# Patient Record
Sex: Male | Born: 1940 | Race: White | Hispanic: No | Marital: Married | Smoking: Former smoker
Health system: Southern US, Community
[De-identification: ages and names within clinical notes are randomized; demographics above are authoritative.]

## PROBLEM LIST (undated history)

## (undated) DIAGNOSIS — K648 Other hemorrhoids: Secondary | ICD-10-CM

## (undated) DIAGNOSIS — N486 Induration penis plastica: Secondary | ICD-10-CM

## (undated) DIAGNOSIS — K5790 Diverticulosis of intestine, part unspecified, without perforation or abscess without bleeding: Secondary | ICD-10-CM

## (undated) DIAGNOSIS — C61 Malignant neoplasm of prostate: Secondary | ICD-10-CM

## (undated) DIAGNOSIS — N401 Enlarged prostate with lower urinary tract symptoms: Secondary | ICD-10-CM

## (undated) DIAGNOSIS — G4730 Sleep apnea, unspecified: Secondary | ICD-10-CM

## (undated) DIAGNOSIS — K219 Gastro-esophageal reflux disease without esophagitis: Secondary | ICD-10-CM

## (undated) DIAGNOSIS — N138 Other obstructive and reflux uropathy: Secondary | ICD-10-CM

## (undated) DIAGNOSIS — L719 Rosacea, unspecified: Secondary | ICD-10-CM

## (undated) DIAGNOSIS — I499 Cardiac arrhythmia, unspecified: Secondary | ICD-10-CM

## (undated) DIAGNOSIS — K2270 Barrett's esophagus without dysplasia: Secondary | ICD-10-CM

## (undated) DIAGNOSIS — G4733 Obstructive sleep apnea (adult) (pediatric): Secondary | ICD-10-CM

## (undated) HISTORY — DX: Other hemorrhoids: K64.8

## (undated) HISTORY — DX: Other obstructive and reflux uropathy: N13.8

## (undated) HISTORY — DX: Benign prostatic hyperplasia with lower urinary tract symptoms: N40.1

## (undated) HISTORY — PX: EYE SURGERY: SHX253

## (undated) HISTORY — DX: Sleep apnea, unspecified: G47.30

## (undated) HISTORY — DX: Barrett's esophagus without dysplasia: K22.70

## (undated) HISTORY — DX: Cardiac arrhythmia, unspecified: I49.9

## (undated) HISTORY — PX: VASECTOMY: SHX75

## (undated) HISTORY — DX: Obstructive sleep apnea (adult) (pediatric): G47.33

## (undated) HISTORY — DX: Induration penis plastica: N48.6

## (undated) HISTORY — DX: Rosacea, unspecified: L71.9

## (undated) HISTORY — PX: HERNIA REPAIR: SHX51

## (undated) HISTORY — PX: TONSILLECTOMY AND ADENOIDECTOMY: SUR1326

## (undated) HISTORY — DX: Diverticulosis of intestine, part unspecified, without perforation or abscess without bleeding: K57.90

## (undated) HISTORY — PX: TUBAL LIGATION: SHX77

---
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
## 2010-02-11 DIAGNOSIS — H9190 Unspecified hearing loss, unspecified ear: Secondary | ICD-10-CM | POA: Insufficient documentation

## 2011-02-19 DIAGNOSIS — K648 Other hemorrhoids: Secondary | ICD-10-CM | POA: Insufficient documentation

## 2013-03-17 HISTORY — PX: CATARACT EXTRACTION, BILATERAL: SHX1313

## 2014-05-03 LAB — HM COLONOSCOPY

## 2016-11-11 ENCOUNTER — Ambulatory Visit (INDEPENDENT_AMBULATORY_CARE_PROVIDER_SITE_OTHER): Payer: Medicare Other | Admitting: Internal Medicine

## 2016-11-11 ENCOUNTER — Encounter: Payer: Self-pay | Admitting: Internal Medicine

## 2016-11-11 VITALS — BP 118/76 | HR 67 | Temp 97.7°F | Ht 69.25 in | Wt 198.0 lb

## 2016-11-11 DIAGNOSIS — G4733 Obstructive sleep apnea (adult) (pediatric): Secondary | ICD-10-CM

## 2016-11-11 DIAGNOSIS — N401 Enlarged prostate with lower urinary tract symptoms: Secondary | ICD-10-CM | POA: Insufficient documentation

## 2016-11-11 DIAGNOSIS — Z7189 Other specified counseling: Secondary | ICD-10-CM | POA: Insufficient documentation

## 2016-11-11 DIAGNOSIS — Z23 Encounter for immunization: Secondary | ICD-10-CM

## 2016-11-11 DIAGNOSIS — L719 Rosacea, unspecified: Secondary | ICD-10-CM | POA: Diagnosis not present

## 2016-11-11 DIAGNOSIS — K2270 Barrett's esophagus without dysplasia: Secondary | ICD-10-CM

## 2016-11-11 DIAGNOSIS — N138 Other obstructive and reflux uropathy: Secondary | ICD-10-CM | POA: Diagnosis not present

## 2016-11-11 NOTE — Assessment & Plan Note (Signed)
Quiet with the PPI Will continue  EGD last 2016

## 2016-11-11 NOTE — Addendum Note (Signed)
Addended by: Pilar Grammes on: 11/11/2016 01:35 PM   Modules accepted: Orders

## 2016-11-11 NOTE — Assessment & Plan Note (Signed)
Plans to restart the CPAP Does feel better when he uses it

## 2016-11-11 NOTE — Assessment & Plan Note (Signed)
See social history 

## 2016-11-11 NOTE — Assessment & Plan Note (Signed)
Satisfied with the saw palmetto No changes for now

## 2016-11-11 NOTE — Progress Notes (Signed)
Subjective:    Patient ID: Christian Carlson, male    DOB: [birth year], 76 y.o.   MRN: [id number]  HPI Here to establish care Recently moved to [facility] with his wife--- about 6 weeks ago  Reviewed records from [facility]  History of GERD/Barrett's esophagus Take omeprazole daily EGD done 04/23/14 along with colonoscopy Internal hemorrhoids also---occasional bleeding if he has to strain  Has BPH Feels the saw palmetto with some improvement Nocturia x 1 usually Some daytime urgency  rosacea on nose mostly Uses OTC oil for this No Rx needed for this  Has OSA Sleeps with a CPAP--though hasn't used it since moving here Not tired during the day but does feel better after using Will restart it  Right 3rd finger triggering at times Not persistent  No current outpatient prescriptions on file prior to visit.   No current facility-administered medications on file prior to visit.     No Known Allergies  Past Medical History:  Diagnosis Date  . Barrett's esophagus   . BPH with obstruction/lower urinary tract symptoms   . Diverticulosis   . Internal hemorrhoids   . Obstructive sleep apnea   . 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 Rosacea     Past Surgical History:  Procedure Laterality Date  . CATARACT EXTRACTION, BILATERAL  2015  . TONSILLECTOMY AND ADENOIDECTOMY      Family History  Problem Relation Age of Onset  . Stroke Father   . Diabetes Father   . Hypertension Sister   . Stroke Brother   . Esophageal cancer Brother   . Heart disease Neg Hx     Social History   Social History  . Marital status: Married    Spouse name: N/A  . Number of children: 2  . Years of education: N/A   Occupational History  . Primary school teacher     Retired   Social History Main Topics  . Smoking status: Former Research scientist (life sciences)  . Smokeless tobacco: Never Used  . Alcohol use No     Comment: quit 2 years ago with wife's breast cancer  . Drug use: Unknown  . Sexual  activity: Not on file   Other Topics Concern  . Not on file   Social History Narrative   Has living will   Wife, then daughter, should make health care decisions   Would accept resuscitation but no prolonged ventilation   No tube feeds if cognitively unaware   Review of Systems  Constitutional: Negative for fatigue.       Did lose 20# with dietary changes recently  HENT: Negative for dental problem, hearing loss and trouble swallowing.   Eyes: Negative for visual disturbance.  Respiratory: Negative for shortness of breath.        Slight tickle cough at times--- irritation from pollen recently?  Cardiovascular: Positive for palpitations. Negative for chest pain and leg swelling.       No tachycardia  Gastrointestinal: Negative for constipation and nausea.       Rare blood from hemorrhoid (paper)  Endocrine: Negative for polydipsia and polyuria.  Genitourinary: Positive for difficulty urinating and urgency.  Musculoskeletal: Positive for arthralgias. Negative for back pain and joint swelling.       Mild pain in hands  Skin:       Wart on chest --wants checked rosacea  Allergic/Immunologic: Negative for environmental allergies and immunocompromised state.  Neurological: Negative for dizziness, syncope and light-headedness.  Psychiatric/Behavioral: Positive for sleep  disturbance. Negative for dysphoric mood. The patient is not nervous/anxious.        Objective:   Physical Exam  Constitutional: He appears well-nourished. No distress.  Neck: No thyromegaly present.  Cardiovascular: Normal rate, regular rhythm, normal heart sounds and intact distal pulses.  Exam reveals no gallop.   No murmur heard. Pulmonary/Chest: Effort normal and breath sounds normal. No respiratory distress. He has no wheezes. He has no rales.  Abdominal: Soft. There is no tenderness.  Musculoskeletal: He exhibits no edema or tenderness.  Lymphadenopathy:    He has no cervical adenopathy.  Skin:  Moderate  bulbous nose of rosacea--not really inflamed Benign 43mm lesion on upper chest Multiple cherry angiomas and benign nevi Mycotic toenails  Psychiatric: He has a normal mood and affect. His behavior is normal.          Assessment & Plan:

## 2016-11-11 NOTE — Assessment & Plan Note (Signed)
Uses OTC product for this

## 2017-05-28 DIAGNOSIS — C4492 Squamous cell carcinoma of skin, unspecified: Secondary | ICD-10-CM

## 2017-05-28 HISTORY — DX: Squamous cell carcinoma of skin, unspecified: C44.92

## 2017-08-04 ENCOUNTER — Encounter: Payer: Self-pay | Admitting: Internal Medicine

## 2017-08-04 ENCOUNTER — Ambulatory Visit (INDEPENDENT_AMBULATORY_CARE_PROVIDER_SITE_OTHER): Payer: Medicare Other | Admitting: Internal Medicine

## 2017-08-04 VITALS — BP 116/78 | HR 69 | Temp 97.5°F | Ht 69.0 in | Wt 206.0 lb

## 2017-08-04 DIAGNOSIS — K2270 Barrett's esophagus without dysplasia: Secondary | ICD-10-CM

## 2017-08-04 DIAGNOSIS — G4733 Obstructive sleep apnea (adult) (pediatric): Secondary | ICD-10-CM

## 2017-08-04 DIAGNOSIS — Z7189 Other specified counseling: Secondary | ICD-10-CM

## 2017-08-04 DIAGNOSIS — N401 Enlarged prostate with lower urinary tract symptoms: Secondary | ICD-10-CM

## 2017-08-04 DIAGNOSIS — N138 Other obstructive and reflux uropathy: Secondary | ICD-10-CM | POA: Diagnosis not present

## 2017-08-04 DIAGNOSIS — R972 Elevated prostate specific antigen [PSA]: Secondary | ICD-10-CM | POA: Diagnosis not present

## 2017-08-04 DIAGNOSIS — Z0000 Encounter for general adult medical examination without abnormal findings: Secondary | ICD-10-CM | POA: Diagnosis not present

## 2017-08-04 LAB — COMPREHENSIVE METABOLIC PANEL
ALBUMIN: 4.1 g/dL (ref 3.5–5.2)
ALK PHOS: 44 U/L (ref 39–117)
ALT: 17 U/L (ref 0–53)
AST: 17 U/L (ref 0–37)
BUN: 15 mg/dL (ref 6–23)
CALCIUM: 9.1 mg/dL (ref 8.4–10.5)
CO2: 32 mEq/L (ref 19–32)
Chloride: 103 mEq/L (ref 96–112)
Creatinine, Ser: 1.21 mg/dL (ref 0.40–1.50)
GFR: 61.71 mL/min (ref >60.00)
Glucose, Bld: 88 mg/dL (ref 70–99)
POTASSIUM: 4.8 meq/L (ref 3.5–5.1)
Sodium: 139 mEq/L (ref 135–145)
TOTAL PROTEIN: 7.1 g/dL (ref 6.0–8.3)
Total Bilirubin: 0.5 mg/dL (ref 0.2–1.2)

## 2017-08-04 LAB — CBC
HCT: 42.3 % (ref 39.0–52.0)
HEMOGLOBIN: 14.3 g/dL (ref 13.0–17.0)
MCHC: 33.7 g/dL (ref 30.0–36.0)
MCV: 92.2 fl (ref 78.0–100.0)
PLATELETS: 220 10*3/uL (ref 150.0–400.0)
RBC: 4.59 Mil/uL (ref 4.22–5.81)
RDW: 13.7 % (ref 11.5–15.5)
WBC: 5.3 10*3/uL (ref 4.0–10.5)

## 2017-08-04 NOTE — Assessment & Plan Note (Signed)
Last 5.2 three years ago I am not excited about repeating but he is worried Will check free PSA and decide on action

## 2017-08-04 NOTE — Assessment & Plan Note (Signed)
Does well with the CPAP 

## 2017-08-04 NOTE — Progress Notes (Addendum)
Subjective:    Patient ID: Christian Carlson, male    DOB: 03-16-1941, 77 y.o.   MRN: [id number]  HPI Here for Medicare wellness visit and follow up of chronic health conditions Reviewed form and advanced directives Reviewed other doctors No alcohol or tobacco Stays active Vision is okay--reading glasses Hearing aides help No falls No depression or anhedonia Independent with instrumental ADLs Mild memory issues--just recall. Nothing worrisome  Doing well at [facility] fairly comfortable there  Continues on the PPI Occasional heartburn --but not bad No dysphagia Due for repeat EGD  Ongoing slow urinary stream Some dribbling Nocturia x 2 generally Still on saw palmetto Last PSA -5.2 (2/16)  Went back to using the CPAP Pretty consistent with it---almost every night Wife notes a difference with it  Current Outpatient Medications on File Prior to Visit  Medication Sig Dispense Refill  . aspirin 81 MG chewable tablet Chew 81 mg by mouth daily.    . magnesium oxide (MAG-OX) 400 MG tablet Take 400 mg by mouth daily.    . Multiple Vitamin (MULTI-VITAMINS) TABS Take 1 tablet by mouth daily.    . Omega-3 Fatty Acids (FISH OIL PO) Take 1,200 mg by mouth daily.    Marland Kitchen omeprazole (PRILOSEC) 20 MG capsule Take 20 mg by mouth daily.    . Saw Palmetto, Serenoa repens, (SAW PALMETTO PO) Take 1 tablet by mouth daily.     No current facility-administered medications on file prior to visit.     No Known Allergies  Past Medical History:  Diagnosis Date  . Barrett's esophagus   . BPH with obstruction/lower urinary tract symptoms   . Diverticulosis   . Internal hemorrhoids   . Obstructive sleep apnea   . 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 Rosacea     Past Surgical History:  Procedure Laterality Date  . CATARACT EXTRACTION, BILATERAL  2015  . TONSILLECTOMY AND ADENOIDECTOMY      Family History  Problem Relation Age of Onset  . Stroke Father   . Diabetes  Father   . Hypertension Sister   . Stroke Brother   . Esophageal cancer Brother   . Heart disease Neg Hx     Social History   Socioeconomic History  . Marital status: Married    Spouse name: Not on file  . Number of children: 2  . Years of education: Not on file  . Highest education level: Not on file  Occupational History  . 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 Financial resource strain: Not on file  . Food insecurity:    Worry: Not on file    Inability: Not on file  . Transportation needs:    Medical: Not on file    Non-medical: Not on file  Tobacco Use  . Smoking status: Former Research scientist (life sciences)  . Smokeless tobacco: Never Used  Substance and Sexual Activity  . Alcohol use: No    Comment: quit 2 years ago with wife's breast cancer  . Drug use: Not on file  . Sexual activity: Not on file  Lifestyle  . Physical activity:    Days per week: Not on file    Minutes per session: Not on file  . Stress: Not on file  Relationships  . Social connections:    Talks on phone: Not on file    Gets together: Not on file    Attends religious service: Not on file  Active member of club or organization: Not on file    Attends meetings of clubs or organizations: Not on file    Relationship status: Not on file  . Intimate partner violence:    Fear of current or ex partner: Not on file    Emotionally abused: Not on file    Physically abused: Not on file    Forced sexual activity: Not on file  Other Topics Concern  . Not on file  Social History Narrative   Has living will   Wife, then daughter, should make health care decisions   Would accept resuscitation but no prolonged ventilation   No tube feeds if cognitively unaware   Review of Systems Has gained 8# since last visit Appetite is fine Teeth okay---keeps up with dentist (in [place]) Wears seat belt Bowels are okay--some constipation. No blood Does have internal hemorrhoid which will bleed  at times though No sig back pain. Some joint aches--not much. No meds Sun damaged lesions --seeing dermatologist Had some chest pain yesterday---after working on the garden (soreness) No SOB No dizziness or syncope No edema No palpitations    Objective:   Physical Exam  Constitutional: He is oriented to person, place, and time. He appears well-developed. No distress.  HENT:  Mouth/Throat: Oropharynx is clear and moist. No oropharyngeal exudate.  Neck: No thyromegaly present.  Cardiovascular: Normal rate, regular rhythm, normal heart sounds and intact distal pulses. Exam reveals no gallop.  No murmur heard. Pulmonary/Chest: Effort normal and breath sounds normal. No respiratory distress. He has no wheezes. He has no rales.  Abdominal: Soft. There is no tenderness.  Musculoskeletal: He exhibits no edema or tenderness.  Lymphadenopathy:    He has no cervical adenopathy.  Neurological: He is alert and oriented to person, place, and time.  President--- "Dwaine Deter, Bush" [phone] [id number] Recall 2/3  Skin: Skin is warm. No rash noted.  Psychiatric: He has a normal mood and affect. His behavior is normal.          Assessment & Plan:

## 2017-08-04 NOTE — Progress Notes (Signed)
Visual Acuity Screening   Right eye Left eye Both eyes  Without correction: 20/25 20/30 20/25   With correction:     Hearing Screening Comments: Wears hearing aids

## 2017-08-04 NOTE — Assessment & Plan Note (Signed)
See social history 

## 2017-08-04 NOTE — Assessment & Plan Note (Signed)
I have personally reviewed the Medicare Annual Wellness questionnaire and have noted 1. The patient's medical and social history 2. Their use of alcohol, tobacco or illicit drugs 3. Their current medications and supplements 4. The patient's functional ability including ADL's, fall risks, home safety risks and hearing or visual             impairment. 5. Diet and physical activities 6. Evidence for depression or mood disorders  The patients weight, height, BMI and visual acuity have been recorded in the chart I have made referrals, counseling and provided education to the patient based review of the above and I have provided the pt with a written personalized care plan for preventive services.  I have provided you with a copy of your personalized plan for preventive services. Please take the time to review along with your updated medication list.  Yearly flu vaccine Colon okay 2/16 Discussed healthy eating, etc UTD on other vaccines

## 2017-08-04 NOTE — Assessment & Plan Note (Signed)
Voids okay on the saw palmetto

## 2017-08-04 NOTE — Assessment & Plan Note (Signed)
Mild reflux symptoms Due for repeat EGD--- will refer to GI

## 2017-08-05 ENCOUNTER — Encounter: Payer: Self-pay | Admitting: Internal Medicine

## 2017-08-05 LAB — PSA, TOTAL AND FREE
PSA, % Free: 8 % (calc) — ABNORMAL LOW (ref >25)
PSA, Free: 0.6 ng/mL
PSA, Total: 7.5 ng/mL — ABNORMAL HIGH (ref <=4.0)

## 2017-08-06 ENCOUNTER — Encounter: Payer: Self-pay | Admitting: Internal Medicine

## 2017-09-14 ENCOUNTER — Other Ambulatory Visit: Payer: Self-pay

## 2017-09-15 ENCOUNTER — Other Ambulatory Visit: Payer: Self-pay

## 2017-09-15 ENCOUNTER — Encounter: Payer: Self-pay | Admitting: Gastroenterology

## 2017-09-15 ENCOUNTER — Ambulatory Visit: Payer: Medicare Other | Admitting: Gastroenterology

## 2017-09-15 VITALS — BP 142/93 | HR 67 | Ht 69.0 in | Wt 207.2 lb

## 2017-09-15 DIAGNOSIS — K2270 Barrett's esophagus without dysplasia: Secondary | ICD-10-CM | POA: Diagnosis not present

## 2017-09-15 NOTE — Progress Notes (Signed)
Christian Bellows MD, MRCP(U.K) [address]  [place]  [place], [place] [address]  Main: [phone]  Fax: [phone]   Gastroenterology Consultation  Referring Provider:     Venia Carbon, MD Primary Care Physician:  Venia Carbon, MD Primary Gastroenterologist:  Dr. Jonathon Carlson  Reason for Consultation:     Barrettes esophagus.         HPI:   Debbie Bellucci is a 77 y.o. y/o male referred for consultation & management  by Dr. Silvio Pate, Theophilus Kinds, MD.    She has been referred for Barrettes esophagus without dysplasia.  Reviewed old EGD records. 04/2014 showed features of short segment Barretts esophagus 2 cm in length from 39 cm to 41 cm .Medium sized hiatal hernia. Biopsy showed sparse intestinal metaplasia and no dysplasia.   Doing well on Prilosec , no symptoms. Does not smoke. Family history of esophageal cancer.   Past Medical History:  Diagnosis Date  . Barrett's esophagus   . BPH with obstruction/lower urinary tract symptoms   . Diverticulosis   . Internal hemorrhoids   . Obstructive sleep apnea   . 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 Rosacea     Past Surgical History:  Procedure Laterality Date  . CATARACT EXTRACTION, BILATERAL  2015  . TONSILLECTOMY AND ADENOIDECTOMY      Prior to Admission medications   Medication Sig Start Date End Date Taking? Authorizing Provider  aspirin 81 MG chewable tablet Chew 81 mg by mouth daily. 09/09/11  Yes [provider]  hydrocortisone 2.5 % cream  07/01/17  Yes [provider]  magnesium oxide (MAG-OX) 400 MG tablet Take 400 mg by mouth daily.   Yes [provider]  Multiple Vitamin (MULTI-VITAMINS) TABS Take 1 tablet by mouth daily. 02/19/11  Yes [provider]  omeprazole (PRILOSEC) 20 MG capsule Take 20 mg by mouth daily.   Yes [provider]  Saw Palmetto, Serenoa repens, (SAW PALMETTO PO) Take 1 tablet by mouth daily.   Yes [provider]    Omega-3 Fatty Acids (FISH OIL PO) Take 1,200 mg by mouth daily. 02/19/11   [provider]    Family History  Problem Relation Age of Onset  . Stroke Father   . Diabetes Father   . Hypertension Sister   . Stroke Brother   . Esophageal cancer Brother   . Heart disease Neg Hx      Social History   Tobacco Use  . Smoking status: Former Research scientist (life sciences)  . Smokeless tobacco: Never Used  Substance Use Topics  . Alcohol use: No    Comment: quit 2 years ago with wife's breast cancer  . Drug use: Not on file    Allergies as of 09/15/2017  . (No Known Allergies)    Review of Systems:    All systems reviewed and negative except where noted in HPI.   Physical Exam:  BP (!) 142/93   Pulse 67   Ht 5\' 9"  (1.753 m)   Wt 207 lb 3.2 oz (94 kg)   BMI 30.60 kg/m  No LMP for male patient. Psych:  Alert and cooperative. Normal mood and affect. General:   Alert,  Well-developed, well-nourished, pleasant and cooperative in NAD Head:  Normocephalic and atraumatic. Eyes:  Sclera clear, no icterus.   Conjunctiva pink. Ears:  Normal auditory acuity. Nose:  No deformity, discharge, or lesions. Mouth:  No deformity or lesions,oropharynx pink & moist. Neck:  Supple; no masses or thyromegaly. Lungs:  Respirations even and unlabored.  Clear throughout to auscultation.   No wheezes, crackles, or rhonchi. No acute distress. Heart:  Regular rate and rhythm; no murmurs, clicks, rubs, or gallops. Abdomen:  Normal bowel sounds.  No bruits.  Soft, non-tender and non-distended without masses, hepatosplenomegaly or hernias noted.  No guarding or rebound tenderness.    Neurologic:  Alert and oriented x3;  grossly normal neurologically. Skin:  Intact without significant lesions or rashes. No jaundice. Lymph Nodes:  No significant cervical adenopathy. Psych:  Alert and cooperative. Normal mood and affect.  Imaging Studies: No results found.  Assessment and Plan:   Slayton Lubitz is a 77 y.o. y/o male has  been referred for barrettes esophagus surveillance. Counseled on life style changes for GERD.   Plan  1. EGD   I have discussed alternative options, risks & benefits,  which include, but are not limited to, bleeding, infection, perforation,respiratory complication & drug reaction.  The patient agrees with this plan & written consent will be obtained.     Follow up PRN  Dr Christian Bellows MD,MRCP(U.K)

## 2017-09-15 NOTE — Patient Instructions (Signed)
Gastroesophageal Reflux Disease, Adult Normally, food travels down the esophagus and stays in the stomach to be digested. However, when a person has gastroesophageal reflux disease (GERD), food and stomach acid move back up into the esophagus. When this happens, the esophagus becomes sore and inflamed. Over time, GERD can create small holes (ulcers) in the lining of the esophagus. What are the causes? This condition is caused by a problem with the muscle between the esophagus and the stomach (lower esophageal sphincter, or LES). Normally, the LES muscle closes after food passes through the esophagus to the stomach. When the LES is weakened or abnormal, it does not close properly, and that allows food and stomach acid to go back up into the esophagus. The LES can be weakened by certain dietary substances, medicines, and medical conditions, including:  Tobacco use.  Pregnancy.  Having a hiatal hernia.  Heavy alcohol use.  Certain foods and beverages, such as coffee, chocolate, onions, and peppermint.  What increases the risk? This condition is more likely to develop in:  People who have an increased body weight.  People who have connective tissue disorders.  People who use NSAID medicines.  What are the signs or symptoms? Symptoms of this condition include:  Heartburn.  Difficult or painful swallowing.  The feeling of having a lump in the throat.  Abitter taste in the mouth.  Bad breath.  Having a large amount of saliva.  Having an upset or bloated stomach.  Belching.  Chest pain.  Shortness of breath or wheezing.  Ongoing (chronic) cough or a night-time cough.  Wearing away of tooth enamel.  Weight loss.  Different conditions can cause chest pain. Make sure to see your health care provider if you experience chest pain. How is this diagnosed? Your health care provider will take a medical history and perform a physical exam. To determine if you have mild or severe  GERD, your health care provider may also monitor how you respond to treatment. You may also have other tests, including:  An endoscopy toexamine your stomach and esophagus with a small camera.  A test thatmeasures the acidity level in your esophagus.  A test thatmeasures how much pressure is on your esophagus.  A barium swallow or modified barium swallow to show the shape, size, and functioning of your esophagus.  How is this treated? The goal of treatment is to help relieve your symptoms and to prevent complications. Treatment for this condition may vary depending on how severe your symptoms are. Your health care provider may recommend:  Changes to your diet.  Medicine.  Surgery.  Follow these instructions at home: Diet  Follow a diet as recommended by your health care provider. This may involve avoiding foods and drinks such as: ? Coffee and tea (with or without caffeine). ? Drinks that containalcohol. ? Energy drinks and sports drinks. ? Carbonated drinks or sodas. ? Chocolate and cocoa. ? Peppermint and mint flavorings. ? Garlic and onions. ? Horseradish. ? Spicy and acidic foods, including peppers, chili powder, curry powder, vinegar, hot sauces, and barbecue sauce. ? Citrus fruit juices and citrus fruits, such as oranges, lemons, and limes. ? Tomato-based foods, such as red sauce, chili, salsa, and pizza with red sauce. ? Fried and fatty foods, such as donuts, french fries, potato chips, and high-fat dressings. ? High-fat meats, such as hot dogs and fatty cuts of red and white meats, such as rib eye steak, sausage, ham, and bacon. ? High-fat dairy items, such as whole milk,   butter, and cream cheese.  Eat small, frequent meals instead of large meals.  Avoid drinking large amounts of liquid with your meals.  Avoid eating meals during the 2-3 hours before bedtime.  Avoid lying down right after you eat.  Do not exercise right after you eat. General  instructions  Pay attention to any changes in your symptoms.  Take over-the-counter and prescription medicines only as told by your health care provider. Do not take aspirin, ibuprofen, or other NSAIDs unless your health care provider told you to do so.  Do not use any tobacco products, including cigarettes, chewing tobacco, and e-cigarettes. If you need help quitting, ask your health care provider.  Wear loose-fitting clothing. Do not wear anything tight around your waist that causes pressure on your abdomen.  Raise (elevate) the head of your bed 6 inches (15cm).  Try to reduce your stress, such as with yoga or meditation. If you need help reducing stress, ask your health care provider.  If you are overweight, reduce your weight to an amount that is healthy for you. Ask your health care provider for guidance about a safe weight loss goal.  Keep all follow-up visits as told by your health care provider. This is important. Contact a health care provider if:  You have new symptoms.  You have unexplained weight loss.  You have difficulty swallowing, or it hurts to swallow.  You have wheezing or a persistent cough.  Your symptoms do not improve with treatment.  You have a hoarse voice. Get help right away if:  You have pain in your arms, neck, jaw, teeth, or back.  You feel sweaty, dizzy, or light-headed.  You have chest pain or shortness of breath.  You vomit and your vomit looks like blood or coffee grounds.  You faint.  Your stool is bloody or black.  You cannot swallow, drink, or eat. This information is not intended to replace advice given to you by your health care provider. Make sure you discuss any questions you have with your health care provider. Document Released: 12/11/2004 Document Revised: 08/01/2015 Document Reviewed: 06/28/2014 Elsevier Interactive Patient Education  2018 [employer]  

## 2017-10-09 ENCOUNTER — Encounter
Admission: RE | Disposition: A | Discharge status: Home / Self Care | Payer: Self-pay | Source: Ambulatory Visit | Attending: Gastroenterology

## 2017-10-09 ENCOUNTER — Encounter: Payer: Self-pay | Admitting: Anesthesiology

## 2017-10-09 ENCOUNTER — Ambulatory Visit: Payer: Medicare Other | Admitting: Anesthesiology

## 2017-10-09 ENCOUNTER — Ambulatory Visit
Admission: RE | Admit: 2017-10-09 | Discharge: 2017-10-09 | Disposition: A | Discharge status: Home / Self Care | Payer: Medicare Other | Source: Ambulatory Visit | Attending: Gastroenterology | Admitting: Gastroenterology

## 2017-10-09 DIAGNOSIS — K2270 Barrett's esophagus without dysplasia: Secondary | ICD-10-CM | POA: Diagnosis not present

## 2017-10-09 DIAGNOSIS — G4733 Obstructive sleep apnea (adult) (pediatric): Secondary | ICD-10-CM | POA: Diagnosis not present

## 2017-10-09 DIAGNOSIS — Z79899 Other long term (current) drug therapy: Secondary | ICD-10-CM | POA: Insufficient documentation

## 2017-10-09 DIAGNOSIS — Z8719 Personal history of other diseases of the digestive system: Secondary | ICD-10-CM | POA: Diagnosis not present

## 2017-10-09 DIAGNOSIS — Z87891 Personal history of nicotine dependence: Secondary | ICD-10-CM | POA: Diagnosis not present

## 2017-10-09 DIAGNOSIS — K210 Gastro-esophageal reflux disease with esophagitis: Secondary | ICD-10-CM | POA: Insufficient documentation

## 2017-10-09 DIAGNOSIS — Z9989 Dependence on other enabling machines and devices: Secondary | ICD-10-CM | POA: Diagnosis not present

## 2017-10-09 DIAGNOSIS — Z7982 Long term (current) use of aspirin: Secondary | ICD-10-CM | POA: Insufficient documentation

## 2017-10-09 HISTORY — DX: Gastro-esophageal reflux disease without esophagitis: K21.9

## 2017-10-09 HISTORY — PX: ESOPHAGOGASTRODUODENOSCOPY (EGD) WITH PROPOFOL: SHX5813

## 2017-10-09 SURGERY — ESOPHAGOGASTRODUODENOSCOPY (EGD) WITH PROPOFOL
Anesthesia: General

## 2017-10-09 MED ORDER — SODIUM CHLORIDE 0.9 % IV SOLN
INTRAVENOUS | Status: DC
  Administered 2017-10-09: 1000 mL via INTRAVENOUS

## 2017-10-09 MED ORDER — LIDOCAINE HCL (CARDIAC) PF 100 MG/5ML IV SOSY
PREFILLED_SYRINGE | INTRAVENOUS | Status: DC | PRN
  Administered 2017-10-09: 100 mg via INTRAVENOUS

## 2017-10-09 MED ORDER — PROPOFOL 500 MG/50ML IV EMUL
INTRAVENOUS | Status: DC | PRN
  Administered 2017-10-09: 160 ug/kg/min via INTRAVENOUS

## 2017-10-09 MED ORDER — PROPOFOL 10 MG/ML IV BOLUS
INTRAVENOUS | Status: DC | PRN
  Administered 2017-10-09: 70 mg via INTRAVENOUS

## 2017-10-09 NOTE — Op Note (Signed)
[facility] Patient Name: Christian Carlson Procedure Date: 10/09/2017 9:41 AM MRN: [id number] Account #: [id number] Date of Birth: [birth year] Admit Type: Outpatient Age: 77 Room: [facility] ENDO ROOM 4 Gender: Male Note Status: Finalized Procedure:            Upper GI endoscopy Indications:          Surveillance for malignancy due to personal history of                        Barrett's esophagus Providers:            Jonathon Bellows MD, MD Referring MD:         Venia Carbon (Referring MD) Medicines:            Monitored Anesthesia Care Complications:        No immediate complications. Procedure:            Pre-Anesthesia Assessment:                       - Prior to the procedure, a History and Physical was                        performed, and patient medications, allergies and                        sensitivities were reviewed. The patient's tolerance of                        previous anesthesia was reviewed.                       - The risks and benefits of the procedure and the                        sedation options and risks were discussed with the                        patient. All questions were answered and informed                        consent was obtained.                       - ASA Grade Assessment: II - A patient with mild                        systemic disease.                       After obtaining informed consent, the endoscope was                        passed under direct vision. Throughout the procedure,                        the patient's blood pressure, pulse, and oxygen                        saturations were monitored continuously. The Endoscope  was introduced through the mouth, and advanced to the                        third part of duodenum. The upper GI endoscopy was                        accomplished with ease. The patient tolerated the                        procedure well. Findings:      One  tongue of salmon-colored mucosa was present. No other visible       abnormalities were present. The maximum longitudinal extent of these       esophageal mucosal changes was 1 cm in length. Biopsies were taken with       a cold forceps for histology.      One 6 mm polyp was found 20 cm from the incisors. The polyp was removed       with a cold biopsy forceps. Resection and retrieval were complete.      The stomach was normal.      The examined duodenum was normal.      The cardia and gastric fundus were normal on retroflexion. Impression:           - Salmon-colored mucosa suggestive of short-segment                        Barrett's esophagus. Biopsied.                       - Esophageal polyp(s) were found. Resected and                        retrieved.                       - Normal stomach.                       - Normal examined duodenum. Recommendation:       - Discharge patient to home (with escort).                       - Resume previous diet.                       - Continue present medications.                       - Await pathology results.                       - Repeat upper endoscopy.                       - Return to GI office PRN. Procedure Code(s):    --- Professional ---                       [phone], Esophagogastroduodenoscopy, flexible, transoral;                        with biopsy, single or multiple Diagnosis Code(s):    --- Professional ---  K22.70, Barrett's esophagus without dysplasia                       K22.8, Other specified diseases of esophagus CPT copyright 2017 American Medical Association. All rights reserved. The codes documented in this report are preliminary and upon coder review may  be revised to meet current compliance requirements. Jonathon Bellows, MD Jonathon Bellows MD, MD 10/09/2017 10:04:17 AM This report has been signed electronically. Number of Addenda: 0 Note Initiated On: 10/09/2017 9:41 AM      [facility]

## 2017-10-09 NOTE — H&P (Signed)
Jonathon Bellows, MD [address] [place], [place], [place], [address] [address], [place], [place], [place], [address] Phone: [phone]  Fax: [phone]  Primary Care Physician:  Venia Carbon, MD   Pre-Procedure History & Physical: HPI:  Christian Carlson is a 77 y.o. male is here for an endoscopy    Past Medical History:  Diagnosis Date  . Barrett's esophagus   . BPH with obstruction/lower urinary tract symptoms   . Diverticulosis   . Internal hemorrhoids   . Obstructive sleep apnea   . 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 Rosacea     Past Surgical History:  Procedure Laterality Date  . CATARACT EXTRACTION, BILATERAL  2015  . TONSILLECTOMY AND ADENOIDECTOMY      Prior to Admission medications   Medication Sig Start Date End Date Taking? Authorizing Provider  aspirin 81 MG chewable tablet Chew 81 mg by mouth daily. 09/09/11   [provider]  hydrocortisone 2.5 % cream  07/01/17   [provider]  magnesium oxide (MAG-OX) 400 MG tablet Take 400 mg by mouth daily.    [provider]  Multiple Vitamin (MULTI-VITAMINS) TABS Take 1 tablet by mouth daily. 02/19/11   [provider]  Omega-3 Fatty Acids (FISH OIL PO) Take 1,200 mg by mouth daily. 02/19/11   [provider]  omeprazole (PRILOSEC) 20 MG capsule Take 20 mg by mouth daily.    [provider]  Saw Palmetto, Serenoa repens, (SAW PALMETTO PO) Take 1 tablet by mouth daily.    [provider]    Allergies as of 09/16/2017  . (No Known Allergies)    Family History  Problem Relation Age of Onset  . Stroke Father   . Diabetes Father   . Hypertension Sister   . Stroke Brother   . Esophageal cancer Brother   . Heart disease Neg Hx     Social History   Socioeconomic History  . Marital status: Married    Spouse name: Not on file  . Number of children: 2  . Years of education: Not on file  . Highest education level: Not on  file  Occupational History  . 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 Financial resource strain: Not on file  . Food insecurity:    Worry: Not on file    Inability: Not on file  . Transportation needs:    Medical: Not on file    Non-medical: Not on file  Tobacco Use  . Smoking status: Former Research scientist (life sciences)  . Smokeless tobacco: Never Used  Substance and Sexual Activity  . Alcohol use: No    Comment: quit 2 years ago with wife's breast cancer  . Drug use: Not on file  . Sexual activity: Not on file  Lifestyle  . Physical activity:    Days per week: Not on file    Minutes per session: Not on file  . Stress: Not on file  Relationships  . Social connections:    Talks on phone: Not on file    Gets together: Not on file    Attends religious service: Not on file    Active member of club or organization: Not on file    Attends meetings of clubs or organizations: Not on file    Relationship status: Not on file  . Intimate partner violence:    Fear of current or ex partner: Not on file  Emotionally abused: Not on file    Physically abused: Not on file    Forced sexual activity: Not on file  Other Topics Concern  . Not on file  Social History Narrative   Has living will   Wife, then daughter, should make health care decisions   Would accept resuscitation but no prolonged ventilation   No tube feeds if cognitively unaware    Review of Systems: See HPI, otherwise negative ROS  Physical Exam: There were no vitals taken for this visit. General:   Alert,  pleasant and cooperative in NAD Head:  Normocephalic and atraumatic. Neck:  Supple; no masses or thyromegaly. Lungs:  Clear throughout to auscultation, normal respiratory effort.    Heart:  +S1, +S2, Regular rate and rhythm, No edema. Abdomen:  Soft, nontender and nondistended. Normal bowel sounds, without guarding, and without rebound.   Neurologic:  Alert and  oriented x4;  grossly normal  neurologically.  Impression/Plan: Christian Carlson is here for an endoscopy  to be performed for  evaluation of barretts esophagus    Risks, benefits, limitations, and alternatives regarding endoscopy have been reviewed with the patient.  Questions have been answered.  All parties agreeable.   Jonathon Bellows, MD  10/09/2017, 9:07 AM

## 2017-10-09 NOTE — Anesthesia Preprocedure Evaluation (Signed)
Anesthesia Evaluation  Patient identified by MRN, date of birth, ID band Patient awake    Reviewed: Allergy & Precautions, NPO status , Patient's Chart, lab work & pertinent test results, reviewed documented beta blocker date and time   Airway Mallampati: III  TM Distance: >3 FB     Dental  (+) Chipped   Pulmonary sleep apnea and Continuous Positive Airway Pressure Ventilation , former smoker,           Cardiovascular      Neuro/Psych    GI/Hepatic   Endo/Other    Renal/GU      Musculoskeletal   Abdominal   Peds  Hematology   Anesthesia Other Findings Obese.  Reproductive/Obstetrics                             Anesthesia Physical Anesthesia Plan  ASA: III  Anesthesia Plan: General   Post-op Pain Management:    Induction: Intravenous  PONV Risk Score and Plan:   Airway Management Planned:   Additional Equipment:   Intra-op Plan:   Post-operative Plan:   Informed Consent: I have reviewed the patients History and Physical, chart, labs and discussed the procedure including the risks, benefits and alternatives for the proposed anesthesia with the patient or authorized representative who has indicated his/her understanding and acceptance.     Plan Discussed with: CRNA  Anesthesia Plan Comments:         Anesthesia Quick Evaluation

## 2017-10-09 NOTE — Transfer of Care (Signed)
Immediate Anesthesia Transfer of Care Note  Patient: Christian Carlson  Procedure(s) Performed: ESOPHAGOGASTRODUODENOSCOPY (EGD) WITH PROPOFOL (N/A )  Patient Location: PACU  Anesthesia Type:General  Level of Consciousness: awake, alert  and oriented  Airway & Oxygen Therapy: Patient Spontanous Breathing and Patient connected to nasal cannula oxygen  Post-op Assessment: Report given to RN and Post -op Vital signs reviewed and stable  Post vital signs: Reviewed and stable  Last Vitals:  Vitals Value Taken Time  BP 105/75 10/09/2017 10:06 AM  Temp 36.1 C 10/09/2017 10:06 AM  Pulse 64 10/09/2017 10:06 AM  Resp 14 10/09/2017 10:06 AM  SpO2 96 % 10/09/2017 10:06 AM  Vitals shown include unvalidated device data.  Last Pain:  Vitals:   10/09/17 1006  TempSrc: Tympanic  PainSc:          Complications: No apparent anesthesia complications

## 2017-10-09 NOTE — Anesthesia Procedure Notes (Signed)
Performed by: Demetrius Charity, CRNA Pre-anesthesia Checklist: Patient identified, Emergency Drugs available, Suction available and Patient being monitored Patient Re-evaluated:Patient Re-evaluated prior to induction Oxygen Delivery Method: Nasal cannula Induction Type: IV induction

## 2017-10-09 NOTE — Anesthesia Postprocedure Evaluation (Signed)
Anesthesia Post Note  Patient: Christian Carlson  Procedure(s) Performed: ESOPHAGOGASTRODUODENOSCOPY (EGD) WITH PROPOFOL (N/A )  Patient location during evaluation: Endoscopy Anesthesia Type: General Level of consciousness: awake and alert Pain management: pain level controlled Vital Signs Assessment: post-procedure vital signs reviewed and stable Respiratory status: spontaneous breathing, nonlabored ventilation, respiratory function stable and patient connected to nasal cannula oxygen Cardiovascular status: blood pressure returned to baseline and stable Postop Assessment: no apparent nausea or vomiting Anesthetic complications: no     Last Vitals:  Vitals:   10/09/17 1006 10/09/17 1016  BP: 105/75 111/76  Pulse: 62 65  Resp: 14 11  Temp: (!) 36.1 C   SpO2: 96% 96%    Last Pain:  Vitals:   10/09/17 1016  TempSrc:   PainSc: 0-No pain                 Zeus Marquis S

## 2017-10-09 NOTE — Anesthesia Post-op Follow-up Note (Signed)
Anesthesia QCDR form completed.        

## 2017-10-12 ENCOUNTER — Encounter: Payer: Self-pay | Admitting: Gastroenterology

## 2017-10-12 LAB — SURGICAL PATHOLOGY

## 2017-10-26 ENCOUNTER — Encounter: Payer: Self-pay | Admitting: Gastroenterology

## 2017-11-12 ENCOUNTER — Ambulatory Visit: Payer: Medicare Other | Admitting: Gastroenterology

## 2017-11-12 ENCOUNTER — Encounter: Payer: Self-pay | Admitting: Gastroenterology

## 2017-11-12 VITALS — BP 114/76 | HR 81 | Ht 69.0 in

## 2017-11-12 DIAGNOSIS — K2270 Barrett's esophagus without dysplasia: Secondary | ICD-10-CM

## 2017-11-12 NOTE — Progress Notes (Signed)
Jonathon Bellows MD, MRCP(U.K) [address]  [place]  [place], [place] [address]  Main: [phone]  Fax: [phone]   Primary Care Physician: Venia Carbon, MD  Primary Gastroenterologist:  Dr. Jonathon Bellows   Chief Complaint  Patient presents with  . Follow-up    Barret's esophagus    HPI: Christian Carlson is a 77 y.o. male     Summary of history :  He was initially referred and seen on 09/15/2017 for Barrettes esophagus.Old EGD records. 04/2014 showed features of short segment Barretts esophagus 2 cm in length from 39 cm to 41 cm .Medium sized hiatal hernia. Biopsy showed sparse intestinal metaplasia and no dysplasia.   Doing well on Prilosec , no symptoms. Does not smoke. Family history of esophageal cancer.   Interval history   09/15/2017-  11/12/2017  10/09/17: EGD One tongue of salmon-colored mucosa was present. No other visible abnormalities were present. The maximum longitudinal extent of these esophageal mucosal changes was 1 cm in length. Biopsies were taken with a cold forceps for histology.One 6 mm polyp was found 20 cm from the incisors. The polyp was removed with a cold biopsy forceps.  Biopsies confirmed reflux esophagitis with intestinal metaplasia consistent with barretts esophagus. The polyp was squamoid mucosa with papillomatous change   On omeprazole 20 mg once daily, before breakfast.    Current Outpatient Medications  Medication Sig Dispense Refill  . aspirin 81 MG chewable tablet Chew 81 mg by mouth daily.    . hydrocortisone 2.5 % cream     . magnesium oxide (MAG-OX) 400 MG tablet Take 400 mg by mouth daily.    . Multiple Vitamin (MULTI-VITAMINS) TABS Take 1 tablet by mouth daily.    . Omega-3 Fatty Acids (FISH OIL PO) Take 1,200 mg by mouth daily.    Marland Kitchen omeprazole (PRILOSEC) 20 MG capsule Take 20 mg by mouth daily.    . Saw Palmetto, Serenoa repens, (SAW PALMETTO PO) Take 1 tablet by mouth daily.     No current facility-administered medications  for this visit.     Allergies as of 11/12/2017  . (No Known Allergies)    ROS:  General: Negative for anorexia, weight loss, fever, chills, fatigue, weakness. ENT: Negative for hoarseness, difficulty swallowing , nasal congestion. CV: Negative for chest pain, angina, palpitations, dyspnea on exertion, peripheral edema.  Respiratory: Negative for dyspnea at rest, dyspnea on exertion, cough, sputum, wheezing.  GI: See history of present illness. GU:  Negative for dysuria, hematuria, urinary incontinence, urinary frequency, nocturnal urination.  Endo: Negative for unusual weight change.    Physical Examination:   BP 114/76   Pulse 81   Ht 5\' 9"  (1.753 m)   BMI 28.65 kg/m   General: Well-nourished, well-developed in no acute distress.  Eyes: No icterus. Conjunctivae pink. Mouth: Oropharyngeal mucosa moist and pink , no lesions erythema or exudate. Lungs: Clear to auscultation bilaterally. Non-labored. Heart: Regular rate and rhythm, no murmurs rubs or gallops.  Abdomen: Bowel sounds are normal, nontender, nondistended, no hepatosplenomegaly or masses, no abdominal bruits or hernia , no rebound or guarding.   Extremities: No lower extremity edema. No clubbing or deformities. Neuro: Alert and oriented x 3.  Grossly intact. Skin: Warm and dry, no jaundice.   Psych: Alert and cooperative, normal mood and affect.   Imaging Studies: No results found.  Assessment and Plan:   Christian Carlson is a 77 y.o. y/o male here to follow up  for barrettes esophagus with no dysplasia .  Counseled on life style changes for GERD.   Plan  1. EGD in 3 years 2. Continue PPI due to esophagitis on recent EGD 3. Continue life style changes for GERD.   Dr Jonathon Bellows  MD,MRCP [facility]) Follow up in 3 years

## 2017-11-13 ENCOUNTER — Encounter: Payer: Medicare Other | Admitting: Internal Medicine

## 2018-03-29 DIAGNOSIS — R972 Elevated prostate specific antigen [PSA]: Secondary | ICD-10-CM | POA: Diagnosis not present

## 2018-03-30 ENCOUNTER — Encounter: Payer: Self-pay | Admitting: Internal Medicine

## 2018-03-30 ENCOUNTER — Ambulatory Visit (INDEPENDENT_AMBULATORY_CARE_PROVIDER_SITE_OTHER): Payer: Medicare HMO | Admitting: Internal Medicine

## 2018-03-30 VITALS — BP 110/76 | HR 71 | Temp 97.5°F | Ht 69.0 in | Wt 199.0 lb

## 2018-03-30 DIAGNOSIS — M5432 Sciatica, left side: Secondary | ICD-10-CM

## 2018-03-30 DIAGNOSIS — M5430 Sciatica, unspecified side: Secondary | ICD-10-CM | POA: Insufficient documentation

## 2018-03-30 NOTE — Assessment & Plan Note (Signed)
No evidence HNP Not tender in spine PSA elevated but no evidence of cancer or spine issues (reassured for now) No action---should resolve If changes, will consider spine imaging

## 2018-03-30 NOTE — Progress Notes (Signed)
Subjective:    Patient ID: Christian Carlson, male    DOB: February 13, 1941, 78 y.o.   MRN: [id number]  HPI Here due to aching in left hip and down leg Gets sense of leg weakness at times---like during classes at [employer] it after lifting box--didn't use his legs as much as he should have Felt something in his back--3-4 weeks ago Awakens at night--some aching No specific back pain Posterior left hip and down lateral leg all the way to foot  Feels it all the time---not bad Sometimes gets worse When it wakes him--he just has to move his legs around for a while  Aleve (1) does help Only a couple of times  Current Outpatient Medications on File Prior to Visit  Medication Sig Dispense Refill  . aspirin 81 MG chewable tablet Chew 81 mg by mouth daily.    . hydrocortisone 2.5 % cream     . magnesium oxide (MAG-OX) 400 MG tablet Take 400 mg by mouth daily.    . Multiple Vitamin (MULTI-VITAMINS) TABS Take 1 tablet by mouth daily.    . Omega-3 Fatty Acids (FISH OIL PO) Take 1,200 mg by mouth daily.    Marland Kitchen omeprazole (PRILOSEC) 20 MG capsule Take 20 mg by mouth daily.    . Saw Palmetto, Serenoa repens, (SAW PALMETTO PO) Take 1 tablet by mouth daily.     No current facility-administered medications on file prior to visit.     No Known Allergies  Past Medical History:  Diagnosis Date  . Barrett's esophagus   . BPH with obstruction/lower urinary tract symptoms   . Diverticulosis   . GERD (gastroesophageal reflux disease)   . Internal hemorrhoids   . Obstructive sleep apnea   . 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 Rosacea     Past Surgical History:  Procedure Laterality Date  . CATARACT EXTRACTION, BILATERAL  2015  . ESOPHAGOGASTRODUODENOSCOPY (EGD) WITH PROPOFOL N/A 10/09/2017   Surgeon: Jonathon Bellows, MD; Location[facility] ENDOSCOPY; REPEAT 3 YEARS 10/2020 Barrett's esophagus  . TONSILLECTOMY AND ADENOIDECTOMY      Family History  Problem Relation Age of Onset  .  Stroke Father   . Diabetes Father   . Hypertension Sister   . Stroke Brother   . Esophageal cancer Brother   . Heart disease Neg Hx     Social History   Socioeconomic History  . Marital status: Married    Spouse name: Not on file  . Number of children: 2  . Years of education: Not on file  . Highest education level: Not on file  Occupational History  . 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 Financial resource strain: Not on file  . Food insecurity:    Worry: Not on file    Inability: Not on file  . Transportation needs:    Medical: Not on file    Non-medical: Not on file  Tobacco Use  . Smoking status: Former Research scientist (life sciences)  . Smokeless tobacco: Never Used  Substance and Sexual Activity  . Alcohol use: No    Comment: quit 2 years ago with wife's breast cancer  . Drug use: Not on file  . Sexual activity: Not on file  Lifestyle  . Physical activity:    Days per week: Not on file    Minutes per session: Not on file  . Stress: Not on file  Relationships  . Social connections:  Talks on phone: Not on file    Gets together: Not on file    Attends religious service: Not on file    Active member of club or organization: Not on file    Attends meetings of clubs or organizations: Not on file    Relationship status: Not on file  . Intimate partner violence:    Fear of current or ex partner: Not on file    Emotionally abused: Not on file    Physically abused: Not on file    Forced sexual activity: Not on file  Other Topics Concern  . Not on file  Social History Narrative   Has living will   Wife, then daughter, should make health care decisions   Would accept resuscitation but no prolonged ventilation   No tube feeds if cognitively unaware   Review of Systems  Bowels have been less predictable--sometimes goes before breakfast (no incontinence though) Voiding more lately     Objective:   Physical Exam  Constitutional: He appears  well-developed. No distress.  Musculoskeletal:     Comments: No spine tenderness Fairly normal back flexion ROM normal in left hip SLR negative (just tight in hamstrings)  Neurological:  Normal gait Normal leg strength           Assessment & Plan:

## 2018-05-04 DIAGNOSIS — R972 Elevated prostate specific antigen [PSA]: Secondary | ICD-10-CM | POA: Diagnosis not present

## 2018-05-16 DIAGNOSIS — C61 Malignant neoplasm of prostate: Secondary | ICD-10-CM

## 2018-05-16 HISTORY — DX: Malignant neoplasm of prostate: C61

## 2018-05-17 DIAGNOSIS — R972 Elevated prostate specific antigen [PSA]: Secondary | ICD-10-CM | POA: Diagnosis not present

## 2018-05-17 DIAGNOSIS — C61 Malignant neoplasm of prostate: Secondary | ICD-10-CM | POA: Diagnosis not present

## 2018-05-31 DIAGNOSIS — C61 Malignant neoplasm of prostate: Secondary | ICD-10-CM | POA: Diagnosis not present

## 2018-06-11 ENCOUNTER — Other Ambulatory Visit: Payer: Self-pay

## 2018-06-14 ENCOUNTER — Encounter: Payer: Self-pay | Admitting: Radiation Oncology

## 2018-06-14 ENCOUNTER — Other Ambulatory Visit: Payer: Self-pay

## 2018-06-14 ENCOUNTER — Ambulatory Visit
Admission: RE | Admit: 2018-06-14 | Discharge: 2018-06-14 | Disposition: A | Discharge status: Home / Self Care | Payer: Medicare HMO | Source: Ambulatory Visit | Attending: Radiation Oncology | Admitting: Radiation Oncology

## 2018-06-14 ENCOUNTER — Other Ambulatory Visit: Payer: Self-pay | Admitting: *Deleted

## 2018-06-14 VITALS — BP 115/73 | HR 73 | Temp 97.8°F | Resp 18 | Wt 200.2 lb

## 2018-06-14 DIAGNOSIS — C61 Malignant neoplasm of prostate: Secondary | ICD-10-CM | POA: Diagnosis not present

## 2018-06-14 NOTE — Consult Note (Signed)
NEW PATIENT EVALUATION  Name: Christian Carlson  MRN: [id number]  Date:   06/14/2018     DOB: 11-10-1940   This 78 y.o. 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  REFERRING PHYSICIAN: Venia Carbon, MD  CHIEF COMPLAINT:  Chief Complaint  Patient presents with  . Prostate Cancer    Initial Eval    DIAGNOSIS: There were no encounter diagnoses.   PREVIOUS INVESTIGATIONS:  Passive pathology report reviewed Clinical notes reviewed MRI of prostate reviewed bone scan and CT scan of abdomen and pelvis ordered  HPI: patient is a 78 year old male fairly asymptomatic who presented with a PSA of 11.5. This prompted an MRI of his prostate at [facility] showing a 46 cc prostate showing Pi rad 5 in the right posterior lateral peripheral zone as well asaPi rad 4 in the right anterior meters per peripheral zone.patient underwent transrectal ultrasound-guided biopsy which was positive for for at the 12 core biopsies positive for Gleason 7 (3+4). These were all from the right lateral lobe. MRI also demonstrated some subcentimeter nodes in the pelvis. Based on patient same age and overall medical condition radiation therapy was ordered. He is seen today for radiation oncology opinion and [facility]. He began he specifically denies any increased lower urinary tract symptoms does have nocturia 2-3. He's having no bone pain.  PLANNED TREATMENT REGIMEN: IM RT radiation therapy to prostate  PAST MEDICAL HISTORY:  has a past medical history of Barrett's esophagus, BPH with obstruction/lower urinary tract symptoms, Diverticulosis, GERD (gastroesophageal reflux disease), Internal hemorrhoids, Obstructive sleep apnea, Peyronie's disease, and Rosacea.    PAST SURGICAL HISTORY:  Past Surgical History:  Procedure Laterality Date  . CATARACT EXTRACTION, BILATERAL  2015  . ESOPHAGOGASTRODUODENOSCOPY  (EGD) WITH PROPOFOL N/A 10/09/2017   Surgeon: Jonathon Bellows, MD; Location: [facility] ENDOSCOPY; REPEAT 3 YEARS 10/2020 Barrett's esophagus  . TONSILLECTOMY AND ADENOIDECTOMY      FAMILY HISTORY: family history includes Diabetes in his father; Esophageal cancer in his brother; Hypertension in his sister; Stroke in his brother and father.  SOCIAL HISTORY:  reports that he has quit smoking. He has never used smokeless tobacco. He reports that he does not drink alcohol.  ALLERGIES: Patient has no known allergies.  MEDICATIONS:  Current Outpatient Medications  Medication Sig Dispense Refill  . aspirin 81 MG chewable tablet Chew 81 mg by mouth daily.    . hydrocortisone 2.5 % cream     . magnesium oxide (MAG-OX) 400 MG tablet Take 400 mg by mouth daily.    . Multiple Vitamin (MULTI-VITAMINS) TABS Take 1 tablet by mouth daily.    . Omega-3 Fatty Acids (FISH OIL PO) Take 1,200 mg by mouth daily.    Marland Kitchen omeprazole (PRILOSEC) 20 MG capsule Take 20 mg by mouth daily.    . Saw Palmetto, Serenoa repens, (SAW PALMETTO PO) Take 1 tablet by mouth daily.     No current facility-administered medications for this encounter.     ECOG PERFORMANCE STATUS:  0 - Asymptomatic  REVIEW OF SYSTEMS:  Patient denies any weight loss, fatigue, weakness, fever, chills or night sweats. Patient denies any loss of vision, blurred vision. Patient denies any ringing  of the ears or hearing loss. No irregular heartbeat. Patient denies heart murmur or history of fainting. Patient denies any chest pain or pain radiating to her upper extremities. Patient denies any shortness of breath, difficulty  breathing at night, cough or hemoptysis. Patient denies any swelling in the lower legs. Patient denies any nausea vomiting, vomiting of blood, or coffee ground material in the vomitus. Patient denies any stomach pain. Patient states has had normal bowel movements no significant constipation or diarrhea. Patient denies any dysuria, hematuria or  significant nocturia. Patient denies any problems walking, swelling in the joints or loss of balance. Patient denies any skin changes, loss of hair or loss of weight. Patient denies any excessive worrying or anxiety or significant depression. Patient denies any problems with insomnia. Patient denies excessive thirst, polyuria, polydipsia. Patient denies any swollen glands, patient denies easy bruising or easy bleeding. Patient denies any recent infections, allergies or URI. Patient "s visual fields have not changed significantly in recent time.    PHYSICAL EXAM: BP 115/73   Pulse 73   Temp 97.8 F (36.6 C)   Resp 18   Wt 200 lb 2.8 oz (90.8 kg)   BMI 29.56 kg/m  Well-developed well-nourished patient in NAD. HEENT reveals PERLA, EOMI, discs not visualized.  Oral cavity is clear. No oral mucosal lesions are identified. Neck is clear without evidence of cervical or supraclavicular adenopathy. Lungs are clear to A&P. Cardiac examination is essentially unremarkable with regular rate and rhythm without murmur rub or thrill. Abdomen is benign with no organomegaly or masses noted. Motor sensory and DTR levels are equal and symmetric in the upper and lower extremities. Cranial nerves II through XII are grossly intact. Proprioception is intact. No peripheral adenopathy or edema is identified. No motor or sensory levels are noted. Crude visual fields are within normal range.  LABORATORY DATA: pathology report reviewed    RADIOLOGY RESULTS:MRI scan reviewed bone scan and CT scan of pelvis ordered   IMPRESSION: stage IIa adenocarcinoma the prostate in 78 year old male  PLAN: at this time based on his PSA being over 10 I have recommended a bone scan and CT scan of the abdomen pelvis. I would like to reevaluate his pelvic nodes. I have performed the [facility] nomogram showing only a 4% chance of lymph node involvement based on his current parameters. I would opt to treat up to 8000 cGy to his  prostate using I MRT radiation therapy. Risks and benefits of treatment including increased lower urinary tract symptoms diarrhea fatigue alteration of blood counts and skin reaction all were discussed in detail with the patient. I've also also recommended 6 months of Lupron therapy along with his treatments. After bone scan and CT scan will haveordered CT simulation. Patient comprehend my treatment plan well.  I would like to take this opportunity to thank you for allowing me to participate in the care of your patient.Noreene Filbert, MD

## 2018-06-15 ENCOUNTER — Other Ambulatory Visit: Payer: Self-pay | Admitting: *Deleted

## 2018-06-15 DIAGNOSIS — C61 Malignant neoplasm of prostate: Secondary | ICD-10-CM

## 2018-06-22 ENCOUNTER — Other Ambulatory Visit: Payer: Self-pay | Admitting: *Deleted

## 2018-06-24 ENCOUNTER — Ambulatory Visit: Admission: RE | Admit: 2018-06-24 | Payer: Medicare HMO | Source: Ambulatory Visit

## 2018-06-24 ENCOUNTER — Ambulatory Visit
Admission: RE | Admit: 2018-06-24 | Discharge: 2018-06-24 | Disposition: A | Discharge status: Home / Self Care | Payer: Medicare HMO | Source: Ambulatory Visit | Attending: Radiation Oncology | Admitting: Radiation Oncology

## 2018-06-24 ENCOUNTER — Other Ambulatory Visit: Payer: Self-pay

## 2018-06-24 DIAGNOSIS — C61 Malignant neoplasm of prostate: Secondary | ICD-10-CM | POA: Insufficient documentation

## 2018-06-24 LAB — POCT I-STAT CREATININE: Creatinine, Ser: 1.1 mg/dL (ref 0.61–1.24)

## 2018-06-24 MED ORDER — IOHEXOL 300 MG/ML  SOLN
100.0000 mL | Freq: Once | INTRAMUSCULAR | Status: AC | PRN
  Administered 2018-06-24: 100 mL via INTRAVENOUS

## 2018-06-24 MED ORDER — TECHNETIUM TC 99M MEDRONATE IV KIT
23.2300 | PACK | Freq: Once | INTRAVENOUS | Status: AC | PRN
  Administered 2018-06-24: 23.23 via INTRAVENOUS

## 2018-06-25 ENCOUNTER — Other Ambulatory Visit: Payer: Self-pay

## 2018-06-27 ENCOUNTER — Other Ambulatory Visit: Payer: Self-pay

## 2018-06-28 ENCOUNTER — Other Ambulatory Visit: Payer: Self-pay

## 2018-06-28 ENCOUNTER — Inpatient Hospital Stay: Payer: Medicare HMO | Attending: Oncology

## 2018-06-28 ENCOUNTER — Ambulatory Visit
Admission: RE | Admit: 2018-06-28 | Discharge: 2018-06-28 | Disposition: A | Discharge status: Home / Self Care | Payer: Medicare HMO | Source: Ambulatory Visit | Attending: Radiation Oncology | Admitting: Radiation Oncology

## 2018-06-28 DIAGNOSIS — C61 Malignant neoplasm of prostate: Secondary | ICD-10-CM | POA: Insufficient documentation

## 2018-06-28 DIAGNOSIS — Z79899 Other long term (current) drug therapy: Secondary | ICD-10-CM | POA: Insufficient documentation

## 2018-06-28 DIAGNOSIS — Z79818 Long term (current) use of other agents affecting estrogen receptors and estrogen levels: Secondary | ICD-10-CM | POA: Insufficient documentation

## 2018-06-28 DIAGNOSIS — Z7982 Long term (current) use of aspirin: Secondary | ICD-10-CM | POA: Insufficient documentation

## 2018-06-28 MED ORDER — LEUPROLIDE ACETATE (4 MONTH) 30 MG IM KIT
30.0000 mg | PACK | Freq: Once | INTRAMUSCULAR | Status: AC
  Administered 2018-06-28: 10:00:00 30 mg via INTRAMUSCULAR

## 2018-06-29 DIAGNOSIS — Z79899 Other long term (current) drug therapy: Secondary | ICD-10-CM | POA: Diagnosis not present

## 2018-06-29 DIAGNOSIS — C61 Malignant neoplasm of prostate: Secondary | ICD-10-CM | POA: Diagnosis not present

## 2018-06-29 DIAGNOSIS — Z7982 Long term (current) use of aspirin: Secondary | ICD-10-CM | POA: Diagnosis not present

## 2018-07-02 ENCOUNTER — Other Ambulatory Visit: Payer: Self-pay | Admitting: *Deleted

## 2018-07-02 DIAGNOSIS — C61 Malignant neoplasm of prostate: Secondary | ICD-10-CM

## 2018-07-06 ENCOUNTER — Ambulatory Visit: Payer: Medicare HMO

## 2018-07-07 ENCOUNTER — Other Ambulatory Visit: Payer: Self-pay

## 2018-07-07 ENCOUNTER — Ambulatory Visit
Admission: RE | Admit: 2018-07-07 | Discharge: 2018-07-07 | Disposition: A | Discharge status: Home / Self Care | Payer: Medicare HMO | Source: Ambulatory Visit | Attending: Radiation Oncology | Admitting: Radiation Oncology

## 2018-07-07 ENCOUNTER — Ambulatory Visit: Payer: Medicare HMO

## 2018-07-08 ENCOUNTER — Other Ambulatory Visit: Payer: Self-pay

## 2018-07-08 ENCOUNTER — Ambulatory Visit
Admission: RE | Admit: 2018-07-08 | Discharge: 2018-07-08 | Disposition: A | Discharge status: Home / Self Care | Payer: Medicare HMO | Source: Ambulatory Visit | Attending: Radiation Oncology | Admitting: Radiation Oncology

## 2018-07-08 DIAGNOSIS — Z7982 Long term (current) use of aspirin: Secondary | ICD-10-CM | POA: Diagnosis not present

## 2018-07-08 DIAGNOSIS — Z79899 Other long term (current) drug therapy: Secondary | ICD-10-CM | POA: Diagnosis not present

## 2018-07-08 DIAGNOSIS — C61 Malignant neoplasm of prostate: Secondary | ICD-10-CM | POA: Diagnosis not present

## 2018-07-09 ENCOUNTER — Ambulatory Visit
Admission: RE | Admit: 2018-07-09 | Discharge: 2018-07-09 | Disposition: A | Discharge status: Home / Self Care | Payer: Medicare HMO | Source: Ambulatory Visit | Attending: Radiation Oncology | Admitting: Radiation Oncology

## 2018-07-09 ENCOUNTER — Other Ambulatory Visit: Payer: Self-pay

## 2018-07-09 DIAGNOSIS — Z79899 Other long term (current) drug therapy: Secondary | ICD-10-CM | POA: Diagnosis not present

## 2018-07-09 DIAGNOSIS — C61 Malignant neoplasm of prostate: Secondary | ICD-10-CM | POA: Diagnosis not present

## 2018-07-09 DIAGNOSIS — Z7982 Long term (current) use of aspirin: Secondary | ICD-10-CM | POA: Diagnosis not present

## 2018-07-12 ENCOUNTER — Other Ambulatory Visit: Payer: Self-pay

## 2018-07-12 ENCOUNTER — Ambulatory Visit
Admission: RE | Admit: 2018-07-12 | Discharge: 2018-07-12 | Disposition: A | Discharge status: Home / Self Care | Payer: Medicare HMO | Source: Ambulatory Visit | Attending: Radiation Oncology | Admitting: Radiation Oncology

## 2018-07-12 DIAGNOSIS — Z79899 Other long term (current) drug therapy: Secondary | ICD-10-CM | POA: Diagnosis not present

## 2018-07-12 DIAGNOSIS — Z7982 Long term (current) use of aspirin: Secondary | ICD-10-CM | POA: Diagnosis not present

## 2018-07-12 DIAGNOSIS — C61 Malignant neoplasm of prostate: Secondary | ICD-10-CM | POA: Diagnosis not present

## 2018-07-13 ENCOUNTER — Other Ambulatory Visit: Payer: Self-pay

## 2018-07-13 ENCOUNTER — Ambulatory Visit: Payer: Medicare HMO

## 2018-07-13 ENCOUNTER — Ambulatory Visit
Admission: RE | Admit: 2018-07-13 | Discharge: 2018-07-13 | Disposition: A | Discharge status: Home / Self Care | Payer: Medicare HMO | Source: Ambulatory Visit | Attending: Radiation Oncology | Admitting: Radiation Oncology

## 2018-07-13 DIAGNOSIS — Z79899 Other long term (current) drug therapy: Secondary | ICD-10-CM | POA: Diagnosis not present

## 2018-07-13 DIAGNOSIS — Z7982 Long term (current) use of aspirin: Secondary | ICD-10-CM | POA: Diagnosis not present

## 2018-07-13 DIAGNOSIS — C61 Malignant neoplasm of prostate: Secondary | ICD-10-CM | POA: Diagnosis not present

## 2018-07-14 ENCOUNTER — Ambulatory Visit
Admission: RE | Admit: 2018-07-14 | Discharge: 2018-07-14 | Disposition: A | Discharge status: Home / Self Care | Payer: Medicare HMO | Source: Ambulatory Visit | Attending: Radiation Oncology | Admitting: Radiation Oncology

## 2018-07-14 ENCOUNTER — Other Ambulatory Visit: Payer: Self-pay

## 2018-07-14 DIAGNOSIS — Z7982 Long term (current) use of aspirin: Secondary | ICD-10-CM | POA: Diagnosis not present

## 2018-07-14 DIAGNOSIS — Z79899 Other long term (current) drug therapy: Secondary | ICD-10-CM | POA: Diagnosis not present

## 2018-07-14 DIAGNOSIS — C61 Malignant neoplasm of prostate: Secondary | ICD-10-CM | POA: Diagnosis not present

## 2018-07-15 ENCOUNTER — Other Ambulatory Visit: Payer: Self-pay

## 2018-07-15 ENCOUNTER — Ambulatory Visit
Admission: RE | Admit: 2018-07-15 | Discharge: 2018-07-15 | Disposition: A | Discharge status: Home / Self Care | Payer: Medicare HMO | Source: Ambulatory Visit | Attending: Radiation Oncology | Admitting: Radiation Oncology

## 2018-07-15 DIAGNOSIS — Z79899 Other long term (current) drug therapy: Secondary | ICD-10-CM | POA: Diagnosis not present

## 2018-07-15 DIAGNOSIS — C61 Malignant neoplasm of prostate: Secondary | ICD-10-CM | POA: Diagnosis not present

## 2018-07-15 DIAGNOSIS — Z7982 Long term (current) use of aspirin: Secondary | ICD-10-CM | POA: Diagnosis not present

## 2018-07-16 ENCOUNTER — Ambulatory Visit
Admission: RE | Admit: 2018-07-16 | Discharge: 2018-07-16 | Disposition: A | Discharge status: Home / Self Care | Payer: Medicare HMO | Source: Ambulatory Visit | Attending: Radiation Oncology | Admitting: Radiation Oncology

## 2018-07-16 ENCOUNTER — Other Ambulatory Visit: Payer: Self-pay

## 2018-07-16 DIAGNOSIS — Z7982 Long term (current) use of aspirin: Secondary | ICD-10-CM | POA: Insufficient documentation

## 2018-07-16 DIAGNOSIS — C61 Malignant neoplasm of prostate: Secondary | ICD-10-CM | POA: Insufficient documentation

## 2018-07-16 DIAGNOSIS — Z79899 Other long term (current) drug therapy: Secondary | ICD-10-CM | POA: Diagnosis not present

## 2018-07-19 ENCOUNTER — Other Ambulatory Visit: Payer: Self-pay

## 2018-07-19 ENCOUNTER — Ambulatory Visit
Admission: RE | Admit: 2018-07-19 | Discharge: 2018-07-19 | Disposition: A | Discharge status: Home / Self Care | Payer: Medicare HMO | Source: Ambulatory Visit | Attending: Radiation Oncology | Admitting: Radiation Oncology

## 2018-07-19 DIAGNOSIS — Z7982 Long term (current) use of aspirin: Secondary | ICD-10-CM | POA: Diagnosis not present

## 2018-07-19 DIAGNOSIS — C61 Malignant neoplasm of prostate: Secondary | ICD-10-CM | POA: Diagnosis not present

## 2018-07-19 DIAGNOSIS — Z79899 Other long term (current) drug therapy: Secondary | ICD-10-CM | POA: Diagnosis not present

## 2018-07-20 ENCOUNTER — Other Ambulatory Visit: Payer: Self-pay

## 2018-07-20 ENCOUNTER — Ambulatory Visit
Admission: RE | Admit: 2018-07-20 | Discharge: 2018-07-20 | Disposition: A | Discharge status: Home / Self Care | Payer: Medicare HMO | Source: Ambulatory Visit | Attending: Radiation Oncology | Admitting: Radiation Oncology

## 2018-07-20 DIAGNOSIS — Z79899 Other long term (current) drug therapy: Secondary | ICD-10-CM | POA: Diagnosis not present

## 2018-07-20 DIAGNOSIS — Z7982 Long term (current) use of aspirin: Secondary | ICD-10-CM | POA: Diagnosis not present

## 2018-07-20 DIAGNOSIS — C61 Malignant neoplasm of prostate: Secondary | ICD-10-CM | POA: Diagnosis not present

## 2018-07-21 ENCOUNTER — Other Ambulatory Visit: Payer: Self-pay

## 2018-07-21 ENCOUNTER — Ambulatory Visit
Admission: RE | Admit: 2018-07-21 | Discharge: 2018-07-21 | Disposition: A | Discharge status: Home / Self Care | Payer: Medicare HMO | Source: Ambulatory Visit | Attending: Radiation Oncology | Admitting: Radiation Oncology

## 2018-07-21 ENCOUNTER — Other Ambulatory Visit: Payer: Self-pay | Admitting: *Deleted

## 2018-07-21 DIAGNOSIS — Z7982 Long term (current) use of aspirin: Secondary | ICD-10-CM | POA: Diagnosis not present

## 2018-07-21 DIAGNOSIS — C61 Malignant neoplasm of prostate: Secondary | ICD-10-CM | POA: Diagnosis not present

## 2018-07-21 DIAGNOSIS — Z79899 Other long term (current) drug therapy: Secondary | ICD-10-CM | POA: Diagnosis not present

## 2018-07-21 MED ORDER — TAMSULOSIN HCL 0.4 MG PO CAPS: 0.4000 mg | ORAL_CAPSULE | Freq: Every day | ORAL | 6 refills | Status: DC

## 2018-07-22 ENCOUNTER — Inpatient Hospital Stay: Payer: Medicare HMO | Attending: Radiation Oncology

## 2018-07-22 ENCOUNTER — Ambulatory Visit
Admission: RE | Admit: 2018-07-22 | Discharge: 2018-07-22 | Disposition: A | Discharge status: Home / Self Care | Payer: Medicare HMO | Source: Ambulatory Visit | Attending: Radiation Oncology | Admitting: Radiation Oncology

## 2018-07-22 ENCOUNTER — Other Ambulatory Visit: Payer: Self-pay

## 2018-07-22 DIAGNOSIS — C61 Malignant neoplasm of prostate: Secondary | ICD-10-CM | POA: Diagnosis not present

## 2018-07-22 DIAGNOSIS — Z79899 Other long term (current) drug therapy: Secondary | ICD-10-CM | POA: Diagnosis not present

## 2018-07-22 DIAGNOSIS — Z7982 Long term (current) use of aspirin: Secondary | ICD-10-CM | POA: Diagnosis not present

## 2018-07-22 LAB — CBC
HCT: 43 % (ref 39.0–52.0)
Hemoglobin: 14.1 g/dL (ref 13.0–17.0)
MCH: 30.3 pg (ref 26.0–34.0)
MCHC: 32.8 g/dL (ref 30.0–36.0)
MCV: 92.5 fL (ref 80.0–100.0)
Platelets: 176 10*3/uL (ref 150–400)
RBC: 4.65 MIL/uL (ref 4.22–5.81)
RDW: 12.9 % (ref 11.5–15.5)
WBC: 6 10*3/uL (ref 4.0–10.5)
nRBC: 0 % (ref 0.0–0.2)

## 2018-07-23 ENCOUNTER — Ambulatory Visit
Admission: RE | Admit: 2018-07-23 | Discharge: 2018-07-23 | Disposition: A | Discharge status: Home / Self Care | Payer: Medicare HMO | Source: Ambulatory Visit | Attending: Radiation Oncology | Admitting: Radiation Oncology

## 2018-07-23 ENCOUNTER — Other Ambulatory Visit: Payer: Self-pay

## 2018-07-23 DIAGNOSIS — Z7982 Long term (current) use of aspirin: Secondary | ICD-10-CM | POA: Diagnosis not present

## 2018-07-23 DIAGNOSIS — C61 Malignant neoplasm of prostate: Secondary | ICD-10-CM | POA: Diagnosis not present

## 2018-07-23 DIAGNOSIS — Z79899 Other long term (current) drug therapy: Secondary | ICD-10-CM | POA: Diagnosis not present

## 2018-07-26 ENCOUNTER — Ambulatory Visit
Admission: RE | Admit: 2018-07-26 | Discharge: 2018-07-26 | Disposition: A | Discharge status: Home / Self Care | Payer: Medicare HMO | Source: Ambulatory Visit | Attending: Radiation Oncology | Admitting: Radiation Oncology

## 2018-07-26 ENCOUNTER — Other Ambulatory Visit: Payer: Self-pay

## 2018-07-26 DIAGNOSIS — Z7982 Long term (current) use of aspirin: Secondary | ICD-10-CM | POA: Diagnosis not present

## 2018-07-26 DIAGNOSIS — C61 Malignant neoplasm of prostate: Secondary | ICD-10-CM | POA: Diagnosis not present

## 2018-07-26 DIAGNOSIS — Z79899 Other long term (current) drug therapy: Secondary | ICD-10-CM | POA: Diagnosis not present

## 2018-07-27 ENCOUNTER — Other Ambulatory Visit: Payer: Self-pay

## 2018-07-27 ENCOUNTER — Ambulatory Visit
Admission: RE | Admit: 2018-07-27 | Discharge: 2018-07-27 | Disposition: A | Discharge status: Home / Self Care | Payer: Medicare HMO | Source: Ambulatory Visit | Attending: Radiation Oncology | Admitting: Radiation Oncology

## 2018-07-27 DIAGNOSIS — Z79899 Other long term (current) drug therapy: Secondary | ICD-10-CM | POA: Diagnosis not present

## 2018-07-27 DIAGNOSIS — C61 Malignant neoplasm of prostate: Secondary | ICD-10-CM | POA: Diagnosis not present

## 2018-07-27 DIAGNOSIS — Z7982 Long term (current) use of aspirin: Secondary | ICD-10-CM | POA: Diagnosis not present

## 2018-07-28 ENCOUNTER — Other Ambulatory Visit: Payer: Self-pay

## 2018-07-28 ENCOUNTER — Ambulatory Visit
Admission: RE | Admit: 2018-07-28 | Discharge: 2018-07-28 | Disposition: A | Discharge status: Home / Self Care | Payer: Medicare HMO | Source: Ambulatory Visit | Attending: Radiation Oncology | Admitting: Radiation Oncology

## 2018-07-28 DIAGNOSIS — Z7982 Long term (current) use of aspirin: Secondary | ICD-10-CM | POA: Diagnosis not present

## 2018-07-28 DIAGNOSIS — C61 Malignant neoplasm of prostate: Secondary | ICD-10-CM | POA: Diagnosis not present

## 2018-07-28 DIAGNOSIS — Z79899 Other long term (current) drug therapy: Secondary | ICD-10-CM | POA: Diagnosis not present

## 2018-07-29 ENCOUNTER — Other Ambulatory Visit: Payer: Self-pay

## 2018-07-29 ENCOUNTER — Ambulatory Visit
Admission: RE | Admit: 2018-07-29 | Discharge: 2018-07-29 | Disposition: A | Discharge status: Home / Self Care | Payer: Medicare HMO | Source: Ambulatory Visit | Attending: Radiation Oncology | Admitting: Radiation Oncology

## 2018-07-29 DIAGNOSIS — C61 Malignant neoplasm of prostate: Secondary | ICD-10-CM | POA: Diagnosis not present

## 2018-07-29 DIAGNOSIS — Z79899 Other long term (current) drug therapy: Secondary | ICD-10-CM | POA: Diagnosis not present

## 2018-07-29 DIAGNOSIS — Z7982 Long term (current) use of aspirin: Secondary | ICD-10-CM | POA: Diagnosis not present

## 2018-07-30 ENCOUNTER — Other Ambulatory Visit: Payer: Self-pay

## 2018-07-30 ENCOUNTER — Ambulatory Visit
Admission: RE | Admit: 2018-07-30 | Discharge: 2018-07-30 | Disposition: A | Discharge status: Home / Self Care | Payer: Medicare HMO | Source: Ambulatory Visit | Attending: Radiation Oncology | Admitting: Radiation Oncology

## 2018-07-30 DIAGNOSIS — Z79899 Other long term (current) drug therapy: Secondary | ICD-10-CM | POA: Diagnosis not present

## 2018-07-30 DIAGNOSIS — C61 Malignant neoplasm of prostate: Secondary | ICD-10-CM | POA: Diagnosis not present

## 2018-07-30 DIAGNOSIS — Z7982 Long term (current) use of aspirin: Secondary | ICD-10-CM | POA: Diagnosis not present

## 2018-08-02 ENCOUNTER — Other Ambulatory Visit: Payer: Self-pay

## 2018-08-02 ENCOUNTER — Ambulatory Visit
Admission: RE | Admit: 2018-08-02 | Discharge: 2018-08-02 | Disposition: A | Discharge status: Home / Self Care | Payer: Medicare HMO | Source: Ambulatory Visit | Attending: Radiation Oncology | Admitting: Radiation Oncology

## 2018-08-02 DIAGNOSIS — Z79899 Other long term (current) drug therapy: Secondary | ICD-10-CM | POA: Diagnosis not present

## 2018-08-02 DIAGNOSIS — C61 Malignant neoplasm of prostate: Secondary | ICD-10-CM | POA: Diagnosis not present

## 2018-08-02 DIAGNOSIS — Z7982 Long term (current) use of aspirin: Secondary | ICD-10-CM | POA: Diagnosis not present

## 2018-08-03 ENCOUNTER — Other Ambulatory Visit: Payer: Self-pay

## 2018-08-03 ENCOUNTER — Ambulatory Visit
Admission: RE | Admit: 2018-08-03 | Discharge: 2018-08-03 | Disposition: A | Discharge status: Home / Self Care | Payer: Medicare HMO | Source: Ambulatory Visit | Attending: Radiation Oncology | Admitting: Radiation Oncology

## 2018-08-03 DIAGNOSIS — Z7982 Long term (current) use of aspirin: Secondary | ICD-10-CM | POA: Diagnosis not present

## 2018-08-03 DIAGNOSIS — C61 Malignant neoplasm of prostate: Secondary | ICD-10-CM | POA: Diagnosis not present

## 2018-08-03 DIAGNOSIS — Z79899 Other long term (current) drug therapy: Secondary | ICD-10-CM | POA: Diagnosis not present

## 2018-08-04 ENCOUNTER — Other Ambulatory Visit: Payer: Self-pay

## 2018-08-04 ENCOUNTER — Ambulatory Visit
Admission: RE | Admit: 2018-08-04 | Discharge: 2018-08-04 | Disposition: A | Discharge status: Home / Self Care | Payer: Medicare HMO | Source: Ambulatory Visit | Attending: Radiation Oncology | Admitting: Radiation Oncology

## 2018-08-04 DIAGNOSIS — Z79899 Other long term (current) drug therapy: Secondary | ICD-10-CM | POA: Diagnosis not present

## 2018-08-04 DIAGNOSIS — C61 Malignant neoplasm of prostate: Secondary | ICD-10-CM | POA: Diagnosis not present

## 2018-08-04 DIAGNOSIS — Z7982 Long term (current) use of aspirin: Secondary | ICD-10-CM | POA: Diagnosis not present

## 2018-08-05 ENCOUNTER — Other Ambulatory Visit: Payer: Self-pay

## 2018-08-05 ENCOUNTER — Ambulatory Visit
Admission: RE | Admit: 2018-08-05 | Discharge: 2018-08-05 | Disposition: A | Discharge status: Home / Self Care | Payer: Medicare HMO | Source: Ambulatory Visit | Attending: Radiation Oncology | Admitting: Radiation Oncology

## 2018-08-05 ENCOUNTER — Inpatient Hospital Stay: Payer: Medicare HMO

## 2018-08-05 ENCOUNTER — Ambulatory Visit: Payer: Medicare HMO

## 2018-08-05 DIAGNOSIS — Z79899 Other long term (current) drug therapy: Secondary | ICD-10-CM | POA: Diagnosis not present

## 2018-08-05 DIAGNOSIS — Z7982 Long term (current) use of aspirin: Secondary | ICD-10-CM | POA: Diagnosis not present

## 2018-08-05 DIAGNOSIS — C61 Malignant neoplasm of prostate: Secondary | ICD-10-CM | POA: Diagnosis not present

## 2018-08-05 LAB — CBC
HCT: 41.1 % (ref 39.0–52.0)
Hemoglobin: 13.7 g/dL (ref 13.0–17.0)
MCH: 30.7 pg (ref 26.0–34.0)
MCHC: 33.3 g/dL (ref 30.0–36.0)
MCV: 92.2 fL (ref 80.0–100.0)
Platelets: 177 10*3/uL (ref 150–400)
RBC: 4.46 MIL/uL (ref 4.22–5.81)
RDW: 12.9 % (ref 11.5–15.5)
WBC: 5.9 10*3/uL (ref 4.0–10.5)
nRBC: 0 % (ref 0.0–0.2)

## 2018-08-06 ENCOUNTER — Encounter: Payer: Medicare Other | Admitting: Internal Medicine

## 2018-08-06 ENCOUNTER — Ambulatory Visit
Admission: RE | Admit: 2018-08-06 | Discharge: 2018-08-06 | Disposition: A | Discharge status: Home / Self Care | Payer: Medicare HMO | Source: Ambulatory Visit | Attending: Radiation Oncology | Admitting: Radiation Oncology

## 2018-08-06 ENCOUNTER — Other Ambulatory Visit: Payer: Self-pay

## 2018-08-06 DIAGNOSIS — Z79899 Other long term (current) drug therapy: Secondary | ICD-10-CM | POA: Diagnosis not present

## 2018-08-06 DIAGNOSIS — Z7982 Long term (current) use of aspirin: Secondary | ICD-10-CM | POA: Diagnosis not present

## 2018-08-06 DIAGNOSIS — C61 Malignant neoplasm of prostate: Secondary | ICD-10-CM | POA: Diagnosis not present

## 2018-08-10 ENCOUNTER — Ambulatory Visit
Admission: RE | Admit: 2018-08-10 | Discharge: 2018-08-10 | Disposition: A | Discharge status: Home / Self Care | Payer: Medicare HMO | Source: Ambulatory Visit | Attending: Radiation Oncology | Admitting: Radiation Oncology

## 2018-08-10 ENCOUNTER — Other Ambulatory Visit: Payer: Self-pay

## 2018-08-10 DIAGNOSIS — Z7982 Long term (current) use of aspirin: Secondary | ICD-10-CM | POA: Diagnosis not present

## 2018-08-10 DIAGNOSIS — Z79899 Other long term (current) drug therapy: Secondary | ICD-10-CM | POA: Diagnosis not present

## 2018-08-10 DIAGNOSIS — C61 Malignant neoplasm of prostate: Secondary | ICD-10-CM | POA: Diagnosis not present

## 2018-08-11 ENCOUNTER — Other Ambulatory Visit: Payer: Self-pay

## 2018-08-11 ENCOUNTER — Ambulatory Visit
Admission: RE | Admit: 2018-08-11 | Discharge: 2018-08-11 | Disposition: A | Discharge status: Home / Self Care | Payer: Medicare HMO | Source: Ambulatory Visit | Attending: Radiation Oncology | Admitting: Radiation Oncology

## 2018-08-11 ENCOUNTER — Encounter: Payer: Medicare Other | Admitting: Internal Medicine

## 2018-08-11 DIAGNOSIS — C61 Malignant neoplasm of prostate: Secondary | ICD-10-CM | POA: Diagnosis not present

## 2018-08-11 DIAGNOSIS — Z7982 Long term (current) use of aspirin: Secondary | ICD-10-CM | POA: Diagnosis not present

## 2018-08-11 DIAGNOSIS — Z79899 Other long term (current) drug therapy: Secondary | ICD-10-CM | POA: Diagnosis not present

## 2018-08-12 ENCOUNTER — Other Ambulatory Visit: Payer: Self-pay

## 2018-08-12 ENCOUNTER — Ambulatory Visit
Admission: RE | Admit: 2018-08-12 | Discharge: 2018-08-12 | Disposition: A | Discharge status: Home / Self Care | Payer: Medicare HMO | Source: Ambulatory Visit | Attending: Radiation Oncology | Admitting: Radiation Oncology

## 2018-08-12 DIAGNOSIS — Z7982 Long term (current) use of aspirin: Secondary | ICD-10-CM | POA: Diagnosis not present

## 2018-08-12 DIAGNOSIS — C61 Malignant neoplasm of prostate: Secondary | ICD-10-CM | POA: Diagnosis not present

## 2018-08-12 DIAGNOSIS — Z79899 Other long term (current) drug therapy: Secondary | ICD-10-CM | POA: Diagnosis not present

## 2018-08-13 ENCOUNTER — Ambulatory Visit
Admission: RE | Admit: 2018-08-13 | Discharge: 2018-08-13 | Disposition: A | Discharge status: Home / Self Care | Payer: Medicare HMO | Source: Ambulatory Visit | Attending: Radiation Oncology | Admitting: Radiation Oncology

## 2018-08-13 ENCOUNTER — Other Ambulatory Visit: Payer: Self-pay

## 2018-08-13 DIAGNOSIS — Z79899 Other long term (current) drug therapy: Secondary | ICD-10-CM | POA: Diagnosis not present

## 2018-08-13 DIAGNOSIS — Z7982 Long term (current) use of aspirin: Secondary | ICD-10-CM | POA: Diagnosis not present

## 2018-08-13 DIAGNOSIS — C61 Malignant neoplasm of prostate: Secondary | ICD-10-CM | POA: Diagnosis not present

## 2018-08-16 ENCOUNTER — Ambulatory Visit
Admission: RE | Admit: 2018-08-16 | Discharge: 2018-08-16 | Disposition: A | Discharge status: Home / Self Care | Payer: Medicare HMO | Source: Ambulatory Visit | Attending: Radiation Oncology | Admitting: Radiation Oncology

## 2018-08-16 ENCOUNTER — Other Ambulatory Visit: Payer: Self-pay

## 2018-08-16 DIAGNOSIS — Z79899 Other long term (current) drug therapy: Secondary | ICD-10-CM | POA: Diagnosis not present

## 2018-08-16 DIAGNOSIS — Z7982 Long term (current) use of aspirin: Secondary | ICD-10-CM | POA: Insufficient documentation

## 2018-08-16 DIAGNOSIS — C61 Malignant neoplasm of prostate: Secondary | ICD-10-CM | POA: Insufficient documentation

## 2018-08-17 ENCOUNTER — Other Ambulatory Visit: Payer: Self-pay

## 2018-08-17 ENCOUNTER — Ambulatory Visit
Admission: RE | Admit: 2018-08-17 | Discharge: 2018-08-17 | Disposition: A | Discharge status: Home / Self Care | Payer: Medicare HMO | Source: Ambulatory Visit | Attending: Radiation Oncology | Admitting: Radiation Oncology

## 2018-08-17 DIAGNOSIS — Z79899 Other long term (current) drug therapy: Secondary | ICD-10-CM | POA: Diagnosis not present

## 2018-08-17 DIAGNOSIS — C61 Malignant neoplasm of prostate: Secondary | ICD-10-CM | POA: Diagnosis not present

## 2018-08-17 DIAGNOSIS — Z7982 Long term (current) use of aspirin: Secondary | ICD-10-CM | POA: Diagnosis not present

## 2018-08-18 ENCOUNTER — Ambulatory Visit
Admission: RE | Admit: 2018-08-18 | Discharge: 2018-08-18 | Disposition: A | Discharge status: Home / Self Care | Payer: Medicare HMO | Source: Ambulatory Visit | Attending: Radiation Oncology | Admitting: Radiation Oncology

## 2018-08-18 ENCOUNTER — Other Ambulatory Visit: Payer: Self-pay

## 2018-08-18 DIAGNOSIS — C61 Malignant neoplasm of prostate: Secondary | ICD-10-CM | POA: Diagnosis not present

## 2018-08-18 DIAGNOSIS — Z7982 Long term (current) use of aspirin: Secondary | ICD-10-CM | POA: Diagnosis not present

## 2018-08-18 DIAGNOSIS — Z79899 Other long term (current) drug therapy: Secondary | ICD-10-CM | POA: Diagnosis not present

## 2018-08-19 ENCOUNTER — Inpatient Hospital Stay: Payer: Medicare HMO | Attending: Radiation Oncology

## 2018-08-19 ENCOUNTER — Ambulatory Visit
Admission: RE | Admit: 2018-08-19 | Discharge: 2018-08-19 | Disposition: A | Discharge status: Home / Self Care | Payer: Medicare HMO | Source: Ambulatory Visit | Attending: Radiation Oncology | Admitting: Radiation Oncology

## 2018-08-19 ENCOUNTER — Other Ambulatory Visit: Payer: Self-pay

## 2018-08-19 DIAGNOSIS — Z79899 Other long term (current) drug therapy: Secondary | ICD-10-CM | POA: Diagnosis not present

## 2018-08-19 DIAGNOSIS — Z7982 Long term (current) use of aspirin: Secondary | ICD-10-CM | POA: Diagnosis not present

## 2018-08-19 DIAGNOSIS — C61 Malignant neoplasm of prostate: Secondary | ICD-10-CM | POA: Diagnosis not present

## 2018-08-19 LAB — CBC
HCT: 39.6 % (ref 39.0–52.0)
Hemoglobin: 13 g/dL (ref 13.0–17.0)
MCH: 30.6 pg (ref 26.0–34.0)
MCHC: 32.8 g/dL (ref 30.0–36.0)
MCV: 93.2 fL (ref 80.0–100.0)
Platelets: 153 10*3/uL (ref 150–400)
RBC: 4.25 MIL/uL (ref 4.22–5.81)
RDW: 13.1 % (ref 11.5–15.5)
WBC: 4.6 10*3/uL (ref 4.0–10.5)
nRBC: 0 % (ref 0.0–0.2)

## 2018-08-20 ENCOUNTER — Ambulatory Visit
Admission: RE | Admit: 2018-08-20 | Discharge: 2018-08-20 | Disposition: A | Discharge status: Home / Self Care | Payer: Medicare HMO | Source: Ambulatory Visit | Attending: Radiation Oncology | Admitting: Radiation Oncology

## 2018-08-20 ENCOUNTER — Other Ambulatory Visit: Payer: Self-pay

## 2018-08-20 DIAGNOSIS — Z7982 Long term (current) use of aspirin: Secondary | ICD-10-CM | POA: Diagnosis not present

## 2018-08-20 DIAGNOSIS — C61 Malignant neoplasm of prostate: Secondary | ICD-10-CM | POA: Diagnosis not present

## 2018-08-20 DIAGNOSIS — Z79899 Other long term (current) drug therapy: Secondary | ICD-10-CM | POA: Diagnosis not present

## 2018-08-23 ENCOUNTER — Ambulatory Visit
Admission: RE | Admit: 2018-08-23 | Discharge: 2018-08-23 | Disposition: A | Discharge status: Home / Self Care | Payer: Medicare HMO | Source: Ambulatory Visit | Attending: Radiation Oncology | Admitting: Radiation Oncology

## 2018-08-23 ENCOUNTER — Other Ambulatory Visit: Payer: Self-pay

## 2018-08-23 DIAGNOSIS — Z7982 Long term (current) use of aspirin: Secondary | ICD-10-CM | POA: Diagnosis not present

## 2018-08-23 DIAGNOSIS — Z79899 Other long term (current) drug therapy: Secondary | ICD-10-CM | POA: Diagnosis not present

## 2018-08-23 DIAGNOSIS — C61 Malignant neoplasm of prostate: Secondary | ICD-10-CM | POA: Diagnosis not present

## 2018-08-24 ENCOUNTER — Other Ambulatory Visit: Payer: Self-pay

## 2018-08-24 ENCOUNTER — Ambulatory Visit
Admission: RE | Admit: 2018-08-24 | Discharge: 2018-08-24 | Disposition: A | Discharge status: Home / Self Care | Payer: Medicare HMO | Source: Ambulatory Visit | Attending: Radiation Oncology | Admitting: Radiation Oncology

## 2018-08-24 DIAGNOSIS — C61 Malignant neoplasm of prostate: Secondary | ICD-10-CM | POA: Diagnosis not present

## 2018-08-24 DIAGNOSIS — Z79899 Other long term (current) drug therapy: Secondary | ICD-10-CM | POA: Diagnosis not present

## 2018-08-24 DIAGNOSIS — Z7982 Long term (current) use of aspirin: Secondary | ICD-10-CM | POA: Diagnosis not present

## 2018-08-25 ENCOUNTER — Other Ambulatory Visit: Payer: Self-pay

## 2018-08-25 ENCOUNTER — Ambulatory Visit
Admission: RE | Admit: 2018-08-25 | Discharge: 2018-08-25 | Disposition: A | Discharge status: Home / Self Care | Payer: Medicare HMO | Source: Ambulatory Visit | Attending: Radiation Oncology | Admitting: Radiation Oncology

## 2018-08-25 DIAGNOSIS — C61 Malignant neoplasm of prostate: Secondary | ICD-10-CM | POA: Diagnosis not present

## 2018-08-25 DIAGNOSIS — Z79899 Other long term (current) drug therapy: Secondary | ICD-10-CM | POA: Diagnosis not present

## 2018-08-25 DIAGNOSIS — Z7982 Long term (current) use of aspirin: Secondary | ICD-10-CM | POA: Diagnosis not present

## 2018-08-26 ENCOUNTER — Other Ambulatory Visit: Payer: Self-pay

## 2018-08-26 ENCOUNTER — Ambulatory Visit
Admission: RE | Admit: 2018-08-26 | Discharge: 2018-08-26 | Disposition: A | Discharge status: Home / Self Care | Payer: Medicare HMO | Source: Ambulatory Visit | Attending: Radiation Oncology | Admitting: Radiation Oncology

## 2018-08-26 DIAGNOSIS — C61 Malignant neoplasm of prostate: Secondary | ICD-10-CM | POA: Diagnosis not present

## 2018-08-26 DIAGNOSIS — L719 Rosacea, unspecified: Secondary | ICD-10-CM | POA: Diagnosis not present

## 2018-08-26 DIAGNOSIS — Z79899 Other long term (current) drug therapy: Secondary | ICD-10-CM | POA: Diagnosis not present

## 2018-08-26 DIAGNOSIS — D2260 Melanocytic nevi of unspecified upper limb, including shoulder: Secondary | ICD-10-CM | POA: Diagnosis not present

## 2018-08-26 DIAGNOSIS — L821 Other seborrheic keratosis: Secondary | ICD-10-CM | POA: Diagnosis not present

## 2018-08-26 DIAGNOSIS — Z1283 Encounter for screening for malignant neoplasm of skin: Secondary | ICD-10-CM | POA: Diagnosis not present

## 2018-08-26 DIAGNOSIS — D1800 Hemangioma unspecified site: Secondary | ICD-10-CM | POA: Diagnosis not present

## 2018-08-26 DIAGNOSIS — Z85828 Personal history of other malignant neoplasm of skin: Secondary | ICD-10-CM | POA: Diagnosis not present

## 2018-08-26 DIAGNOSIS — Z7982 Long term (current) use of aspirin: Secondary | ICD-10-CM | POA: Diagnosis not present

## 2018-08-27 ENCOUNTER — Other Ambulatory Visit: Payer: Self-pay

## 2018-08-27 ENCOUNTER — Ambulatory Visit
Admission: RE | Admit: 2018-08-27 | Discharge: 2018-08-27 | Disposition: A | Discharge status: Home / Self Care | Payer: Medicare HMO | Source: Ambulatory Visit | Attending: Radiation Oncology | Admitting: Radiation Oncology

## 2018-08-27 DIAGNOSIS — Z79899 Other long term (current) drug therapy: Secondary | ICD-10-CM | POA: Diagnosis not present

## 2018-08-27 DIAGNOSIS — Z7982 Long term (current) use of aspirin: Secondary | ICD-10-CM | POA: Diagnosis not present

## 2018-08-27 DIAGNOSIS — C61 Malignant neoplasm of prostate: Secondary | ICD-10-CM | POA: Diagnosis not present

## 2018-08-30 ENCOUNTER — Other Ambulatory Visit: Payer: Self-pay

## 2018-08-30 ENCOUNTER — Ambulatory Visit
Admission: RE | Admit: 2018-08-30 | Discharge: 2018-08-30 | Disposition: A | Discharge status: Home / Self Care | Payer: Medicare HMO | Source: Ambulatory Visit | Attending: Radiation Oncology | Admitting: Radiation Oncology

## 2018-08-30 DIAGNOSIS — Z79899 Other long term (current) drug therapy: Secondary | ICD-10-CM | POA: Diagnosis not present

## 2018-08-30 DIAGNOSIS — C61 Malignant neoplasm of prostate: Secondary | ICD-10-CM | POA: Diagnosis not present

## 2018-08-30 DIAGNOSIS — Z7982 Long term (current) use of aspirin: Secondary | ICD-10-CM | POA: Diagnosis not present

## 2018-08-31 ENCOUNTER — Ambulatory Visit
Admission: RE | Admit: 2018-08-31 | Discharge: 2018-08-31 | Disposition: A | Discharge status: Home / Self Care | Payer: Medicare HMO | Source: Ambulatory Visit | Attending: Radiation Oncology | Admitting: Radiation Oncology

## 2018-08-31 ENCOUNTER — Other Ambulatory Visit: Payer: Self-pay

## 2018-08-31 DIAGNOSIS — Z79899 Other long term (current) drug therapy: Secondary | ICD-10-CM | POA: Diagnosis not present

## 2018-08-31 DIAGNOSIS — Z7982 Long term (current) use of aspirin: Secondary | ICD-10-CM | POA: Diagnosis not present

## 2018-08-31 DIAGNOSIS — C61 Malignant neoplasm of prostate: Secondary | ICD-10-CM | POA: Diagnosis not present

## 2018-09-01 ENCOUNTER — Other Ambulatory Visit: Payer: Self-pay

## 2018-09-01 ENCOUNTER — Ambulatory Visit
Admission: RE | Admit: 2018-09-01 | Discharge: 2018-09-01 | Disposition: A | Discharge status: Home / Self Care | Payer: Medicare HMO | Source: Ambulatory Visit | Attending: Radiation Oncology | Admitting: Radiation Oncology

## 2018-09-01 ENCOUNTER — Ambulatory Visit: Payer: Medicare HMO

## 2018-09-01 DIAGNOSIS — Z7982 Long term (current) use of aspirin: Secondary | ICD-10-CM | POA: Diagnosis not present

## 2018-09-01 DIAGNOSIS — Z79899 Other long term (current) drug therapy: Secondary | ICD-10-CM | POA: Diagnosis not present

## 2018-09-01 DIAGNOSIS — C61 Malignant neoplasm of prostate: Secondary | ICD-10-CM | POA: Diagnosis not present

## 2018-09-02 ENCOUNTER — Other Ambulatory Visit: Payer: Self-pay

## 2018-09-02 ENCOUNTER — Ambulatory Visit
Admission: RE | Admit: 2018-09-02 | Discharge: 2018-09-02 | Disposition: A | Discharge status: Home / Self Care | Payer: Medicare HMO | Source: Ambulatory Visit | Attending: Radiation Oncology | Admitting: Radiation Oncology

## 2018-09-02 DIAGNOSIS — Z7982 Long term (current) use of aspirin: Secondary | ICD-10-CM | POA: Diagnosis not present

## 2018-09-02 DIAGNOSIS — C61 Malignant neoplasm of prostate: Secondary | ICD-10-CM | POA: Diagnosis not present

## 2018-09-02 DIAGNOSIS — Z79899 Other long term (current) drug therapy: Secondary | ICD-10-CM | POA: Diagnosis not present

## 2018-09-16 ENCOUNTER — Encounter: Payer: Self-pay | Admitting: Radiation Oncology

## 2018-09-17 ENCOUNTER — Encounter: Payer: Self-pay | Admitting: Radiation Oncology

## 2018-09-18 ENCOUNTER — Telehealth: Payer: Self-pay

## 2018-09-18 MED ORDER — TAMSULOSIN HCL 0.4 MG PO CAPS: 0.8000 mg | ORAL_CAPSULE | Freq: Every day | ORAL | 0 refills | Status: DC

## 2018-09-18 NOTE — Telephone Encounter (Signed)
Received incoming call from after hours nurse line. Pt is out of his Flomax & he takes it 2x a day. Spoke with Dr. Ethelene Hal & sent Rx in for [facility] [address]

## 2018-09-20 MED ORDER — TAMSULOSIN HCL 0.4 MG PO CAPS: 0.8000 mg | ORAL_CAPSULE | Freq: Every day | ORAL | 3 refills | Status: DC

## 2018-09-20 NOTE — Addendum Note (Signed)
Addended by: Pilar Grammes on: 09/20/2018 02:16 PM   Modules accepted: Orders

## 2018-09-20 NOTE — Telephone Encounter (Signed)
Okay to send new Rx for 2 daily

## 2018-09-20 NOTE — Telephone Encounter (Signed)
[facility] Night - Client TELEPHONE ADVICE RECORD [facility] Patient Name: Christian Carlson Gender: Male DOB: 1940-08-28 Age: 78 Y 30 M 21 D Return Phone Number: [phone] (Primary), [phone] (Secondary) Address: City/State/Zip: [place] [address] Client [facility] - Client Client Site [place] Physician Viviana Simpler - MD Contact Type Call Who Is Calling Patient / Member / Family / Caregiver Call Type Triage / Clinical Relationship To Patient Self Return Phone Number [phone] (Primary) Chief Complaint URINATE - sudden inability to urinate Reason for Call Symptomatic / Request for [place] states that he needs a refill for Flomax. He was advised to double the dosage due to the inability to urinate. He used up the script faster since he was told to double it. Translation No Nurse Assessment Nurse: Cain Sieve, RN, Clarise Cruz Date/Time (Eastern Time): 09/18/2018 11:50:02 AM Confirm and document reason for call. If symptomatic, describe symptoms. ---Caller states that he needs a refill for Flomax. He was advised to double the dosage due to the inability to urinate. He used up the script faster since he was told to double it. Has been able to urinate with the increase in dose. Ran out last night. Has been up about every hr trying to urinate. Has the patient had close contact with a person known or suspected to have the novel coronavirus illness OR traveled / lives in area with major community spread (including international travel) in the last 14 days from the onset of symptoms? * If Asymptomatic, screen for exposure and travel within the last 14 days. ---No Does the patient have any new or worsening symptoms? ---Yes Will a triage be completed? ---Yes Related visit to physician within the last 2 weeks? ---No Does the PT have any chronic conditions? (i.e.  diabetes, asthma, this includes High risk factors for pregnancy, etc.) ---Yes List chronic conditions. ---prostate cancer Is this a behavioral health or substance abuse call? ---No Guidelines Guideline Title Affirmed Question Affirmed Notes Nurse Date/Time (Eastern Time) Urinary Symptoms Urinating more frequently than usual (i.e., frequency) Cumesty, RN, Clarise Cruz 09/18/2018 11:53:34 AM PLEASE NOTE: All timestamps contained within this report are represented as [place] Standard Time. CONFIDENTIALTY NOTICE: This fax transmission is intended only for the addressee. It contains information that is legally privileged, confidential or otherwise protected from use or disclosure. If you are not the intended recipient, you are strictly prohibited from reviewing, disclosing, copying using or disseminating any of this information or taking any action in reliance on or regarding this information. If you have received this fax in error, please notify us immediately by telephone so that we can arrange for its return to [place]. Phone: [phone], Toll-Free: [phone], Fax: [phone] Page: 2 of 2 Call Id: [id number] Cook. Time Eilene Ghazi Time) Disposition Final User 09/18/2018 11:45:06 AM Send to Urgent Queue Carma Lair 09/18/2018 11:57:22 AM See PCP within 24 Hours Yes Cumesty, RN, Margurite Auerbach Disagree/Comply Comply Caller Understands Yes PreDisposition Call Doctor Care Advice Given Per Guideline SEE PCP WITHIN 24 HOURS: * IF OFFICE WILL BE OPEN: You need to be seen within the next 24 hours. Call your doctor (or NP/PA) when the office opens and make an appointment. CALL BACK IF: * Fever occurs * Unable to urinate and bladder feels full * You become worse. CARE ADVICE given per Urinary Symptoms (Adult) guideline. Comments User: Rosalio Loud, RN Date/Time Eilene Ghazi Time): 09/18/2018 12:11:19 PM Called the Saturday clinic and explained to them what  was going on. Clinic stated they will call in script with  correct [facility] [address] [place], [place] [address] for pt. Pt informed and verbalized understanding. He denied any other questions or needs at this time. Referrals [facility]

## 2018-09-20 NOTE — Telephone Encounter (Signed)
A 30 day supply was phoned in from [place] over the weekend to carry patient through.  (Please see notation at the bottom).  FYI to Dr. Silvio Pate.

## 2018-10-08 ENCOUNTER — Encounter: Payer: Self-pay | Admitting: Radiation Oncology

## 2018-10-08 ENCOUNTER — Ambulatory Visit
Admission: RE | Admit: 2018-10-08 | Discharge: 2018-10-08 | Disposition: A | Discharge status: Home / Self Care | Payer: Medicare HMO | Source: Ambulatory Visit | Attending: Radiation Oncology | Admitting: Radiation Oncology

## 2018-10-08 ENCOUNTER — Other Ambulatory Visit: Payer: Self-pay

## 2018-10-08 VITALS — BP 135/82 | HR 69 | Temp 96.8°F | Resp 18 | Wt 200.8 lb

## 2018-10-08 DIAGNOSIS — Z923 Personal history of irradiation: Secondary | ICD-10-CM | POA: Insufficient documentation

## 2018-10-08 DIAGNOSIS — C61 Malignant neoplasm of prostate: Secondary | ICD-10-CM | POA: Diagnosis not present

## 2018-10-08 NOTE — Progress Notes (Signed)
Radiation Oncology Follow up Note  Name: Christian Carlson   Date:   10/08/2018 MRN:  [id number] DOB: Dec 07, 1940    This 78 y.o. male presents to the clinic today for 1 month follow-up status post IMRT radiation therapy for stage IIa Gleason 7 (3+4) adenocarcinoma the prostate.  REFERRING PROVIDER: Venia Carbon, MD  HPI: Patient is a 78 year old male now out 1 month having completed IMRT radiation therapy to his prostate for stage IIa (T1 cN0 M0) Gleason 7 (3+4) adenocarcinoma the prostate presenting with a PSA of 11.5.  Seen today in routine follow-up he is doing well he is still taking Flomax twice a day.  I have instructed him he can stop his morning dose and go to the after dinner dose.  He specifically denies frequency urgency or nocturia.  He is having no bowel problems..  COMPLICATIONS OF TREATMENT: none  FOLLOW UP COMPLIANCE: keeps appointments   PHYSICAL EXAM:  BP 135/82   Pulse 69   Temp (!) 96.8 F (36 C)   Resp 18   Wt 200 lb 13.4 oz (91.1 kg)   BMI 29.66 kg/m  Well-developed well-nourished patient in NAD. HEENT reveals PERLA, EOMI, discs not visualized.  Oral cavity is clear. No oral mucosal lesions are identified. Neck is clear without evidence of cervical or supraclavicular adenopathy. Lungs are clear to A&P. Cardiac examination is essentially unremarkable with regular rate and rhythm without murmur rub or thrill. Abdomen is benign with no organomegaly or masses noted. Motor sensory and DTR levels are equal and symmetric in the upper and lower extremities. Cranial nerves II through XII are grossly intact. Proprioception is intact. No peripheral adenopathy or edema is identified. No motor or sensory levels are noted. Crude visual fields are within normal range.  RADIOLOGY RESULTS: No current films for review  PLAN: Present time patient is doing well with low side effect profile from IMRT radiation therapy to his prostate.  I am pleased with his overall progress.  Of asked to  see him back in 3 months for follow-up and will perform a PSA prior to that visit.  I would like to take this opportunity to thank you for allowing me to participate in the care of your patient.Noreene Filbert, MD

## 2018-11-09 ENCOUNTER — Encounter: Payer: Medicare Other | Admitting: Internal Medicine

## 2018-11-29 ENCOUNTER — Ambulatory Visit (INDEPENDENT_AMBULATORY_CARE_PROVIDER_SITE_OTHER): Payer: Medicare HMO | Admitting: Internal Medicine

## 2018-11-29 ENCOUNTER — Other Ambulatory Visit: Payer: Self-pay

## 2018-11-29 ENCOUNTER — Encounter: Payer: Medicare Other | Admitting: Internal Medicine

## 2018-11-29 ENCOUNTER — Encounter: Payer: Self-pay | Admitting: Internal Medicine

## 2018-11-29 VITALS — BP 100/72 | HR 85 | Temp 98.3°F | Ht 68.25 in | Wt 193.0 lb

## 2018-11-29 DIAGNOSIS — N401 Enlarged prostate with lower urinary tract symptoms: Secondary | ICD-10-CM

## 2018-11-29 DIAGNOSIS — K2270 Barrett's esophagus without dysplasia: Secondary | ICD-10-CM

## 2018-11-29 DIAGNOSIS — Z0000 Encounter for general adult medical examination without abnormal findings: Secondary | ICD-10-CM | POA: Diagnosis not present

## 2018-11-29 DIAGNOSIS — Z7189 Other specified counseling: Secondary | ICD-10-CM | POA: Diagnosis not present

## 2018-11-29 DIAGNOSIS — G4733 Obstructive sleep apnea (adult) (pediatric): Secondary | ICD-10-CM

## 2018-11-29 DIAGNOSIS — C61 Malignant neoplasm of prostate: Secondary | ICD-10-CM | POA: Insufficient documentation

## 2018-11-29 DIAGNOSIS — N138 Other obstructive and reflux uropathy: Secondary | ICD-10-CM

## 2018-11-29 DIAGNOSIS — R002 Palpitations: Secondary | ICD-10-CM | POA: Diagnosis not present

## 2018-11-29 LAB — COMPREHENSIVE METABOLIC PANEL
ALT: 23 U/L (ref 0–53)
AST: 26 U/L (ref 0–37)
Albumin: 4.1 g/dL (ref 3.5–5.2)
Alkaline Phosphatase: 43 U/L (ref 39–117)
BUN: 24 mg/dL — ABNORMAL HIGH (ref 6–23)
CO2: 29 mEq/L (ref 19–32)
Calcium: 9.4 mg/dL (ref 8.4–10.5)
Chloride: 106 mEq/L (ref 96–112)
Creatinine, Ser: 1.2 mg/dL (ref 0.40–1.50)
GFR: 58.42 mL/min — ABNORMAL LOW (ref >60.00)
Glucose, Bld: 89 mg/dL (ref 70–99)
Potassium: 5 mEq/L (ref 3.5–5.1)
Sodium: 142 mEq/L (ref 135–145)
Total Bilirubin: 0.4 mg/dL (ref 0.2–1.2)
Total Protein: 6.9 g/dL (ref 6.0–8.3)

## 2018-11-29 LAB — CBC
HCT: 39.3 % (ref 39.0–52.0)
Hemoglobin: 12.9 g/dL — ABNORMAL LOW (ref 13.0–17.0)
MCHC: 32.9 g/dL (ref 30.0–36.0)
MCV: 94.1 fl (ref 78.0–100.0)
Platelets: 196 10*3/uL (ref 150.0–400.0)
RBC: 4.18 Mil/uL — ABNORMAL LOW (ref 4.22–5.81)
RDW: 13.6 % (ref 11.5–15.5)
WBC: 5.3 10*3/uL (ref 4.0–10.5)

## 2018-11-29 LAB — T4, FREE: Free T4: 0.71 ng/dL (ref 0.60–1.60)

## 2018-11-29 NOTE — Assessment & Plan Note (Signed)
Better with the tamsulosin 

## 2018-11-29 NOTE — Assessment & Plan Note (Signed)
Recently finished RT Will be getting follow up PSA and decide about lupron then

## 2018-11-29 NOTE — Assessment & Plan Note (Signed)
See social history 

## 2018-11-29 NOTE — Assessment & Plan Note (Addendum)
Very brief and at rest Not clearly related to caffeine Will check EKG  EKG shouls sinus @68 . Borderline left axis. Normal intervals. No ischemic changes or hypertrophy. No comparison  Will check labs If persists, consider cardiology (and monitor)

## 2018-11-29 NOTE — Progress Notes (Signed)
Hearing Screening   125Hz  250Hz  500Hz  1000Hz  2000Hz  3000Hz  4000Hz  6000Hz  8000Hz   Right ear:           Left ear:           Comments: He has Hearing Aids. Wearing them today.   Visual Acuity Screening   Right eye Left eye Both eyes  Without correction: 20/20 20/30 20/15   With correction:

## 2018-11-29 NOTE — Progress Notes (Signed)
Subjective:    Patient ID: Christian Carlson, male    DOB: 16-Apr-1940, 78 y.o.   MRN: [id number]  HPI Here for Medicare wellness visit and follow up of chronic medical conditions Reviewed form and advanced directives Reviewed other doctors No alcohol or tobacco Tries to exercise regularly Vision is fine Hearing is not good--hearing aides help No falls No depression or anhedonia Independent with instrumental ADLs No sig memory problems---will have occasional word finding problems  Just finished RT for prostate cancer--Gleason 3+4 Went to [facility]rology [facility]) for the follow up and it went up to 11.5 Has done well with the radiation Still trying to "regulate" the urine---stream slightly better but still some dribbling On tamsulosin which is helping Also had lupron before the RT  Will get a feeling of fluttering in his neck Occasionally in day--and also at night Feels like it is fast Mostly occurs at rest---or first thing in the morning May only last 10 seconds No chest pain No SOB or change in exercise tolerance--though somewhat more tired from the RT No edema  Thinks he has right plantar fasciitis Gets pain upon getting up---and then works out May hurt at the end of his golf game Walks in soft comfortable shoes (no arch support)  Did have EGD and Barrett's esophagus  Continues on PPI  Sleeping better No recent apnea and snoring is better Not using CPAP regularly---though did feel better with it in the morning  Current Outpatient Medications on File Prior to Visit  Medication Sig Dispense Refill  . aspirin 81 MG chewable tablet Chew 81 mg by mouth daily.    . hydrocortisone 2.5 % cream     . magnesium oxide (MAG-OX) 400 MG tablet Take 400 mg by mouth daily.    . Multiple Vitamin (MULTI-VITAMINS) TABS Take 1 tablet by mouth daily.    Marland Kitchen omeprazole (PRILOSEC) 20 MG capsule Take 20 mg by mouth daily.    . tamsulosin (FLOMAX) 0.4 MG CAPS capsule Take 2 capsules (0.8 mg  total) by mouth daily after supper. 180 capsule 3   No current facility-administered medications on file prior to visit.     No Known Allergies  Past Medical History:  Diagnosis Date  . Barrett's esophagus   . BPH with obstruction/lower urinary tract symptoms   . Diverticulosis   . GERD (gastroesophageal reflux disease)   . Internal hemorrhoids   . Obstructive sleep apnea   . 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 Rosacea     Past Surgical History:  Procedure Laterality Date  . CATARACT EXTRACTION, BILATERAL  2015  . ESOPHAGOGASTRODUODENOSCOPY (EGD) WITH PROPOFOL N/A 10/09/2017   Surgeon: Jonathon Bellows, MD; Location: [facility] ENDOSCOPY; REPEAT 3 YEARS 10/2020 Barrett's esophagus  . TONSILLECTOMY AND ADENOIDECTOMY      Family History  Problem Relation Age of Onset  . Stroke Father   . Diabetes Father   . Hypertension Sister   . Stroke Brother   . Esophageal cancer Brother   . Heart disease Neg Hx     Social History   Socioeconomic History  . Marital status: Married    Spouse name: Not on file  . Number of children: 2  . Years of education: Not on file  . Highest education level: Not on file  Occupational History  . 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 Financial resource strain: Not on file  . Food insecurity    Worry:  Not on file    Inability: Not on file  . Transportation needs    Medical: Not on file    Non-medical: Not on file  Tobacco Use  . Smoking status: Former Research scientist (life sciences)  . Smokeless tobacco: Never Used  Substance and Sexual Activity  . Alcohol use: No    Comment: quit 2 years ago with wife's breast cancer  . Drug use: Not on file  . Sexual activity: Not on file  Lifestyle  . Physical activity    Days per week: Not on file    Minutes per session: Not on file  . Stress: Not on file  Relationships  . Social Herbalist on phone: Not on file    Gets together: Not on file    Attends religious  service: Not on file    Active member of club or organization: Not on file    Attends meetings of clubs or organizations: Not on file    Relationship status: Not on file  . Intimate partner violence    Fear of current or ex partner: Not on file    Emotionally abused: Not on file    Physically abused: Not on file    Forced sexual activity: Not on file  Other Topics Concern  . Not on file  Social History Narrative   Has living will   Wife, then daughter, should make health care decisions   Would accept resuscitation but no prolonged ventilation   No tube feeds if cognitively unaware   Review of Systems Appetite is fine Weight is fairly stable Wears seat belt Teeth okay ---keeps up with dentist Bowels are fine--no blood. Some diarrhea at start of radiation treatment No sig back or joint pain No suspicious skin lesions now--does see derm    Objective:   Physical Exam  Constitutional: He is oriented to person, place, and time. He appears well-developed. No distress.  HENT:  Mouth/Throat: Oropharynx is clear and moist. No oropharyngeal exudate.  Neck: No thyromegaly present.  Cardiovascular: Normal rate, regular rhythm, normal heart sounds and intact distal pulses. Exam reveals no gallop and no friction rub.  No murmur heard. Respiratory: Effort normal and breath sounds normal. No respiratory distress. He has no wheezes. He has no rales.  GI: Soft. There is no abdominal tenderness.  Musculoskeletal:        General: No tenderness or edema.  Lymphadenopathy:    He has no cervical adenopathy.  Neurological: He is alert and oriented to person, place, and time.  President--- "Christian Carlson, Christian Carlson---?" [phone] [id number] Recall 2/3  Skin: No rash noted. No erythema.  Psychiatric: He has a normal mood and affect. His behavior is normal.           Assessment & Plan:

## 2018-11-29 NOTE — Assessment & Plan Note (Signed)
I have personally reviewed the Medicare Annual Wellness questionnaire and have noted 1. The patient's medical and social history 2. Their use of alcohol, tobacco or illicit drugs 3. Their current medications and supplements 4. The patient's functional ability including ADL's, fall risks, home safety risks and hearing or visual             impairment. 5. Diet and physical activities 6. Evidence for depression or mood disorders  The patients weight, height, BMI and visual acuity have been recorded in the chart I have made referrals, counseling and provided education to the patient based review of the above and I have provided the pt with a written personalized care plan for preventive services.  I have provided you with a copy of your personalized plan for preventive services. Please take the time to review along with your updated medication list.  Flu vaccine at [place] with colon Regularly exercising--discussed proper arch support for the plantar fasciitis

## 2018-11-29 NOTE — Assessment & Plan Note (Signed)
Recent EGD On the PPI

## 2018-11-29 NOTE — Assessment & Plan Note (Signed)
Less symptoms but is considering going back on the CPAP

## 2018-12-06 ENCOUNTER — Encounter: Payer: Self-pay | Admitting: Internal Medicine

## 2018-12-13 DIAGNOSIS — C61 Malignant neoplasm of prostate: Secondary | ICD-10-CM | POA: Diagnosis not present

## 2019-01-06 ENCOUNTER — Other Ambulatory Visit: Payer: Self-pay

## 2019-01-06 DIAGNOSIS — C61 Malignant neoplasm of prostate: Secondary | ICD-10-CM

## 2019-01-07 ENCOUNTER — Other Ambulatory Visit: Payer: Self-pay

## 2019-01-07 ENCOUNTER — Other Ambulatory Visit: Payer: Self-pay | Admitting: *Deleted

## 2019-01-07 ENCOUNTER — Inpatient Hospital Stay: Payer: Medicare HMO | Attending: Radiation Oncology

## 2019-01-07 DIAGNOSIS — C61 Malignant neoplasm of prostate: Secondary | ICD-10-CM

## 2019-01-07 LAB — PSA: Prostatic Specific Antigen: 0.03 ng/mL (ref 0.00–4.00)

## 2019-01-13 ENCOUNTER — Other Ambulatory Visit: Payer: Self-pay

## 2019-01-14 ENCOUNTER — Encounter: Payer: Self-pay | Admitting: Radiation Oncology

## 2019-01-14 ENCOUNTER — Ambulatory Visit
Admission: RE | Admit: 2019-01-14 | Discharge: 2019-01-14 | Disposition: A | Discharge status: Home / Self Care | Payer: Medicare HMO | Source: Ambulatory Visit | Attending: Radiation Oncology | Admitting: Radiation Oncology

## 2019-01-14 ENCOUNTER — Other Ambulatory Visit: Payer: Self-pay

## 2019-01-14 VITALS — BP 117/76 | HR 79 | Temp 98.0°F | Resp 18 | Wt 196.0 lb

## 2019-01-14 DIAGNOSIS — Z923 Personal history of irradiation: Secondary | ICD-10-CM | POA: Diagnosis not present

## 2019-01-14 DIAGNOSIS — C61 Malignant neoplasm of prostate: Secondary | ICD-10-CM | POA: Insufficient documentation

## 2019-01-14 NOTE — Progress Notes (Signed)
Radiation Oncology Follow up Note  Name: Christian Carlson   Date:   01/14/2019 MRN:  [id number] DOB: 12/29/1940    This 78 y.o. male presents to the clinic today for 39-month follow-up status post IMRT radiation therapy for stage IIa Gleason 7 (3+4) adenocarcinoma the prostate.  REFERRING PROVIDER: Venia Carbon, MD  HPI: Patient is a 78 year old male now out 4 months having completed IMRT radiation therapy to his prostate for Gleason 7 (3+4) presenting with a PSA of 11.5.  He is seen today in routine follow-up is doing well currently on Flomax having very little urinary symptoms specifically denies urgency frequency or nocturia.  He is having no bowel issues at this time.  His most recent PSA is 0.03.Marland Kitchen  COMPLICATIONS OF TREATMENT: none  FOLLOW UP COMPLIANCE: keeps appointments   PHYSICAL EXAM:  There were no vitals taken for this visit. Well-developed well-nourished patient in NAD. HEENT reveals PERLA, EOMI, discs not visualized.  Oral cavity is clear. No oral mucosal lesions are identified. Neck is clear without evidence of cervical or supraclavicular adenopathy. Lungs are clear to A&P. Cardiac examination is essentially unremarkable with regular rate and rhythm without murmur rub or thrill. Abdomen is benign with no organomegaly or masses noted. Motor sensory and DTR levels are equal and symmetric in the upper and lower extremities. Cranial nerves II through XII are grossly intact. Proprioception is intact. No peripheral adenopathy or edema is identified. No motor or sensory levels are noted. Crude visual fields are within normal range.  RADIOLOGY RESULTS: No current films to review  PLAN: Present time patient is doing well under excellent biochemical control of his prostate cancer.  I am pleased with his overall progress he is asked me about discontinuing Flomax sites told him that is possible just stop and see if his symptoms worsen.  He is doing extremely well on it without side effect so  I have suggested he continues at this time.  We will see him back in 6 months with a PSA prior to the visit.  Patient knows to call with any concerns at any time.  I would like to take this opportunity to thank you for allowing me to participate in the care of your patient.Noreene Filbert, MD

## 2019-02-19 ENCOUNTER — Other Ambulatory Visit: Payer: Self-pay | Admitting: Radiation Oncology

## 2019-03-24 DIAGNOSIS — D1801 Hemangioma of skin and subcutaneous tissue: Secondary | ICD-10-CM | POA: Diagnosis not present

## 2019-03-24 DIAGNOSIS — L309 Dermatitis, unspecified: Secondary | ICD-10-CM | POA: Diagnosis not present

## 2019-03-24 DIAGNOSIS — D225 Melanocytic nevi of trunk: Secondary | ICD-10-CM | POA: Diagnosis not present

## 2019-03-24 DIAGNOSIS — L738 Other specified follicular disorders: Secondary | ICD-10-CM | POA: Diagnosis not present

## 2019-03-24 DIAGNOSIS — Z1283 Encounter for screening for malignant neoplasm of skin: Secondary | ICD-10-CM | POA: Diagnosis not present

## 2019-03-24 DIAGNOSIS — B351 Tinea unguium: Secondary | ICD-10-CM | POA: Diagnosis not present

## 2019-03-24 DIAGNOSIS — L570 Actinic keratosis: Secondary | ICD-10-CM | POA: Diagnosis not present

## 2019-03-24 DIAGNOSIS — Z86007 Personal history of in-situ neoplasm of skin: Secondary | ICD-10-CM | POA: Diagnosis not present

## 2019-03-24 DIAGNOSIS — B353 Tinea pedis: Secondary | ICD-10-CM | POA: Diagnosis not present

## 2019-03-24 DIAGNOSIS — L821 Other seborrheic keratosis: Secondary | ICD-10-CM | POA: Diagnosis not present

## 2019-06-06 ENCOUNTER — Telehealth: Payer: Self-pay

## 2019-06-06 NOTE — Telephone Encounter (Signed)
Okay Will assess at [place]

## 2019-06-06 NOTE — Telephone Encounter (Signed)
LVM on both numbers 

## 2019-06-06 NOTE — Telephone Encounter (Signed)
[facility] Night - Client TELEPHONE ADVICE RECORD [facility] Patient Name: Christian Carlson Gender: Male DOB: [birth year] Age: 79 Y 21 M 7 D Return Phone Number: [phone] (Primary), [phone] (Secondary) Address: City/State/Zip: [place] [address] Client [facility] Night - Client Client Site [place] Physician Viviana Simpler - MD Contact Type Call Who Is Calling Patient / Member / Family / Caregiver Call Type Triage / Clinical Relationship To Patient Self Return Phone Number [phone] (Primary) Chief Complaint Unclassified Symptom Reason for Call Symptomatic / Request for [place] states he has been having a little twitch in the lower left side of his stomach area. Caller states he had diverticulitis before about 4-5 years ago. He stated the twitch feels just like it when he was beginning to get it. No other symptoms, the twitches come and go. Translation No Nurse Assessment Nurse: Artemio Aly, RN, Gerald Stabs Date/Time (Eastern Time): 06/03/2019 8:03:04 PM Confirm and document reason for call. If symptomatic, describe symptoms. ---Caller states he has been having a little twitch in the lower left side of his stomach area. Come and goes lasting about 4-5 seconds. Has diverticulitis. Pain with the twitch. Started late yesterday afternoon. Has the patient had close contact with a person known or suspected to have the novel coronavirus illness OR traveled / lives in area with major community spread (including international travel) in the last 14 days from the onset of symptoms? * If Asymptomatic, screen for exposure and travel within the last 14 days. ---No Does the patient have any new or worsening symptoms? ---Yes Will a triage be completed? ---Yes Related visit to physician within the last 2 weeks? ---No Does the PT have any chronic conditions? (i.e.  diabetes, asthma, this includes High risk factors for pregnancy, etc.) ---Yes List chronic conditions. ---diverticulitis Is this a behavioral health or substance abuse call? ---No Guidelines Guideline Title Affirmed Question Affirmed Notes Nurse Date/Time Eilene Ghazi Time) Abdominal Pain - Male Age > 72 years Artemio Aly, Mickle Plumb 06/03/2019 8:05:59 PM PLEASE NOTE: All timestamps contained within this report are represented as [place] Standard Time. CONFIDENTIALTY NOTICE: This fax transmission is intended only for the addressee. It contains information that is legally privileged, confidential or otherwise protected from use or disclosure. If you are not the intended recipient, you are strictly prohibited from reviewing, disclosing, copying using or disseminating any of this information or taking any action in reliance on or regarding this information. If you have received this fax in error, please notify us immediately by telephone so that we can arrange for its return to [place]. Phone: [phone], Toll-Free: [phone], Fax: [phone] Page: 2 of 2 Call Id: [id number] [place]. Time Eilene Ghazi Time) Disposition Final User 06/03/2019 7:38:13 PM Attempt made - no message left Hector Shade 06/03/2019 7:52:09 PM Attempt made - no message left Hector Shade 06/03/2019 8:09:34 PM See PCP within 24 Hours Yes Artemio Aly, RN, Marvell Fuller Disagree/Comply Comply Caller Understands Yes PreDisposition Call Doctor Care Advice Given Per Guideline SEE PCP WITHIN 24 HOURS: * IF OFFICE WILL BE CLOSED AND NO PCP (PRIMARY CARE PROVIDER) SECONDLEVEL TRIAGE: You need to be seen within the next 24 hours. A clinic or an urgent care center is often a good source of care if your doctor's office is closed or you can't get an appointment. CALL BACK IF: * Severe pain lasts over 1 hour * Constant pain lasts over 2 hours * You become worse. CARE ADVICE  given per Abdominal Pain, Male (Adult) guideline. Referrals GO TO FACILITY  UNDECIDED

## 2019-06-06 NOTE — Telephone Encounter (Signed)
Pt c/o of abdominal twitches similar to when he had flares of diverticulitis in the past. He reports this started 4 days ago after he ate popcorn. Pt reports the last 2 days he has stopped eating popcorn and has not had any twitches. Advised pt it would be best to have a pt with PCP for assessment. Pt agreed and is scheduled for tomorrow. Advised if any new symptoms or worsening symptoms to contact this office. Pt verbalized understanding.

## 2019-06-07 ENCOUNTER — Other Ambulatory Visit: Payer: Self-pay

## 2019-06-07 ENCOUNTER — Encounter: Payer: Self-pay | Admitting: Internal Medicine

## 2019-06-07 ENCOUNTER — Ambulatory Visit: Payer: Medicare HMO | Admitting: Internal Medicine

## 2019-06-07 DIAGNOSIS — R1032 Left lower quadrant pain: Secondary | ICD-10-CM

## 2019-06-07 NOTE — Assessment & Plan Note (Signed)
Very brief No other GI symptoms Exam benign Doesn't sound like flare of diverticulitis Reassured--- observe only

## 2019-06-07 NOTE — Progress Notes (Signed)
Subjective:    Patient ID: Christian Carlson, male    DOB: 02-01-1941, 79 y.o.   MRN: [id number]  HPI Here due to lower abdominal pain This visit occurred during the SARS-CoV-2 public health emergency.  Safety protocols were in place, including screening questions prior to the visit, additional usage of staff PPE, and extensive cleaning of exam room while observing appropriate contact time as indicated for disinfecting solutions.   4 days ago--awoke with brief but severe LLQ pain Felt like the diverticulitis again--- ?17 years ago No problems since then Lasted only 15 seconds 2 more brief spells the next day Nothing in the past 3 days  Current Outpatient Medications on File Prior to Visit  Medication Sig Dispense Refill  . aspirin 81 MG chewable tablet Chew 81 mg by mouth daily.    . magnesium oxide (MAG-OX) 400 MG tablet Take 400 mg by mouth daily.    . Multiple Vitamin (MULTI-VITAMINS) TABS Take 1 tablet by mouth daily.    Marland Kitchen omeprazole (PRILOSEC) 20 MG capsule Take 20 mg by mouth daily.    . tamsulosin (FLOMAX) 0.4 MG CAPS capsule TAKE 1 CAPSULE(0.4 MG) BY MOUTH DAILY AFTER SUPPER 30 capsule 11  . hydrocortisone 2.5 % cream      No current facility-administered medications on file prior to visit.    No Known Allergies  Past Medical History:  Diagnosis Date  . Barrett's esophagus   . BPH with obstruction/lower urinary tract symptoms   . Diverticulosis   . GERD (gastroesophageal reflux disease)   . Internal hemorrhoids   . Obstructive sleep apnea   . 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 Rosacea     Past Surgical History:  Procedure Laterality Date  . CATARACT EXTRACTION, BILATERAL  2015  . ESOPHAGOGASTRODUODENOSCOPY (EGD) WITH PROPOFOL N/A 10/09/2017   Surgeon: Jonathon Bellows, MD; Location: [facility] ENDOSCOPY; REPEAT 3 YEARS 10/2020 Barrett's esophagus  . TONSILLECTOMY AND ADENOIDECTOMY      Family History  Problem  Relation Age of Onset  . Stroke Father   . Diabetes Father   . Hypertension Sister   . Stroke Brother   . Esophageal cancer Brother   . Heart disease Neg Hx     Social History   Socioeconomic History  . Marital status: Married    Spouse name: Not on file  . Number of children: 2  . Years of education: Not on file  . Highest education level: Not on file  Occupational History  . Occupation: Primary school teacher    Comment: Retired  Tobacco Use  . Smoking status: Former Research scientist (life sciences)  . Smokeless tobacco: Never Used  Substance and Sexual Activity  . Alcohol use: No    Comment: quit 2 years ago with wife's breast cancer  . Drug use: Not on file  . Sexual activity: Not on file  Other Topics Concern  . Not on file  Social History Narrative   Has living will   Wife, then daughter, should make health care decisions   Would accept resuscitation but no prolonged ventilation   No tube feeds if cognitively unaware   Social Determinants of Health   Financial Resource Strain:   . Difficulty of Paying Living Expenses:   Food Insecurity:   . Worried About Charity fundraiser in the Last Year:   . Arboriculturist in the Last Year:   Transportation Needs:   .  Lack of Transportation (Medical):   Marland Kitchen Lack of Transportation (Non-Medical):   Physical Activity:   . Days of Exercise per Week:   . Minutes of Exercise per Session:   Stress:   . Feeling of Stress :   Social Connections:   . Frequency of Communication with Friends and Family:   . Frequency of Social Gatherings with Friends and Family:   . Attends Religious Services:   . Active Member of Clubs or Organizations:   . Attends Archivist Meetings:   Marland Kitchen Marital Status:   Intimate Partner Violence:   . Fear of Current or Ex-Partner:   . Emotionally Abused:   Marland Kitchen Physically Abused:   . Sexually Abused:    Review of Systems No fever 1 loose stool 2 days ago--otherwise normal Appetite is fine No N/V No cough or SOB     Objective:   Physical Exam  Constitutional: He appears well-developed. No distress.  Respiratory: Effort normal and breath sounds normal. No respiratory distress. He has no wheezes. He has no rales.  GI: Soft. Bowel sounds are normal. He exhibits no distension. There is no abdominal tenderness. There is no rebound and no guarding.           Assessment & Plan:

## 2019-06-30 ENCOUNTER — Other Ambulatory Visit: Payer: Self-pay

## 2019-06-30 ENCOUNTER — Ambulatory Visit: Payer: Medicare HMO | Admitting: Dermatology

## 2019-06-30 DIAGNOSIS — B351 Tinea unguium: Secondary | ICD-10-CM | POA: Diagnosis not present

## 2019-06-30 DIAGNOSIS — L578 Other skin changes due to chronic exposure to nonionizing radiation: Secondary | ICD-10-CM

## 2019-06-30 DIAGNOSIS — L570 Actinic keratosis: Secondary | ICD-10-CM

## 2019-06-30 DIAGNOSIS — B353 Tinea pedis: Secondary | ICD-10-CM | POA: Diagnosis not present

## 2019-06-30 NOTE — Patient Instructions (Addendum)
Recommend daily broad spectrum sunscreen SPF 30+ to sun-exposed areas, reapply every 2 hours as needed. Call for new or changing lesions.  Cryotherapy Aftercare  . Wash gently with soap and water everyday.   . Apply Vaseline and Band-Aid daily until healed.  

## 2019-06-30 NOTE — Progress Notes (Signed)
   Follow-Up Visit   Subjective  Christian Carlson is a 79 y.o. male who presents for the following: Follow-up (AK's, Eczema).  3 month follow up for AK's treated with LN2 at L antihelix, L helix, L dorsal hand, R mid frontal scalp and R dorsal hand. Patient not aware of any new or changing spots.   Patient was given TMC 0.1% cream to eczema at R upper arm which he advises has cleared up. He was also given ciclopirox to treat tinea pedis and onychomychosis which is improved at feet and toenails.  The following portions of the chart were reviewed this encounter and updated as appropriate: Tobacco  Allergies  Meds  Problems  Med Hx  Surg Hx  Fam Hx      Review of Systems: No other skin or systemic complaints.  Objective  Well appearing patient in no apparent distress; mood and affect are within normal limits.  A focused examination was performed including face, ears, hands, arms, feet. Relevant physical exam findings are noted in the Assessment and Plan.  Objective  R frontal scalp x 1, R hand x 1, R forearm x 1 (3): Erythematous thin papules/macules with gritty scale.   Objective  Toenails: Improving  Objective  Feet: Scaling over distal and lateral soles.   Assessment & Plan  AK (actinic keratosis) (3) R frontal scalp x 1, R hand x 1, R forearm x 1  Destruction of lesion - R frontal scalp x 1, R hand x 1, R forearm x 1  Destruction method: cryotherapy   Informed consent: discussed and consent obtained   Lesion destroyed using liquid nitrogen: Yes   Cryotherapy cycles:  2 Outcome: patient tolerated procedure well with no complications   Post-procedure details: wound care instructions given    Onychomycosis Toenails  Chronic, not at goal, improving.  Cont ciclopirox cream BID. Also discussed option of pulse PO terbinafine but patient defers at this time  Tinea pedis of right foot Feet  Cont ciclopirox BID until clear then once weekly  Actinic Damage - diffuse  scaly erythematous macules with underlying dyspigmentation - Recommend daily broad spectrum sunscreen SPF 30+ to sun-exposed areas, reapply every 2 hours as needed.  - Call for new or changing lesions.  Return in about 9 months (around 03/21/2020) for AK FOLLOW UP, TBSE.   Graciella Belton, RMA, am acting as scribe for Forest Gleason, MD .   Documentation: I have reviewed the above documentation for accuracy and completeness, and I agree with the above.  Forest Gleason, MD

## 2019-07-14 ENCOUNTER — Encounter: Payer: Self-pay | Admitting: Dermatology

## 2019-07-15 ENCOUNTER — Other Ambulatory Visit: Payer: Self-pay

## 2019-07-15 ENCOUNTER — Inpatient Hospital Stay: Payer: Medicare HMO | Attending: Radiation Oncology

## 2019-07-15 DIAGNOSIS — C61 Malignant neoplasm of prostate: Secondary | ICD-10-CM | POA: Insufficient documentation

## 2019-07-15 LAB — PSA: Prostatic Specific Antigen: 0.16 ng/mL (ref 0.00–4.00)

## 2019-07-22 ENCOUNTER — Encounter: Payer: Self-pay | Admitting: Radiation Oncology

## 2019-07-22 ENCOUNTER — Ambulatory Visit
Admission: RE | Admit: 2019-07-22 | Discharge: 2019-07-22 | Disposition: A | Discharge status: Home / Self Care | Payer: Medicare HMO | Source: Ambulatory Visit | Attending: Radiation Oncology | Admitting: Radiation Oncology

## 2019-07-22 ENCOUNTER — Other Ambulatory Visit: Payer: Self-pay

## 2019-07-22 VITALS — BP 138/85 | HR 75 | Temp 96.9°F | Resp 18 | Wt 205.9 lb

## 2019-07-22 DIAGNOSIS — C61 Malignant neoplasm of prostate: Secondary | ICD-10-CM | POA: Diagnosis present

## 2019-07-22 DIAGNOSIS — Z923 Personal history of irradiation: Secondary | ICD-10-CM | POA: Insufficient documentation

## 2019-07-22 NOTE — Progress Notes (Signed)
Radiation Oncology Follow up Note  Name: Christian Carlson   Date:   07/22/2019 MRN:  [id number] DOB: [birth year]    This 79 y.o. 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  His initial PSA was 11.5..  REFERRING PROVIDER: Venia Carbon, MD  HPI: Patient is a 79 year old male now out 10 months having completed IMRT radiation therapy to his prostate for Gleason 7 adenocarcinoma the prostate presenting with a PSA 11.5.  Seen today in routine follow-up he is doing well.  He specifically denies diarrhea dysuria or any other GI/GU complaints.  He currently is on Flomax which seems to be helping him significantly with his nocturia.  His most recent PSA is 0.16.  COMPLICATIONS OF TREATMENT: none  FOLLOW UP COMPLIANCE: keeps appointments   PHYSICAL EXAM:  BP 138/85   Pulse 75   Temp (!) 96.9 F (36.1 C)   Resp 18   Wt 205 lb 14.4 oz (93.4 kg)   BMI 31.08 kg/m  Well-developed well-nourished patient in NAD. HEENT reveals PERLA, EOMI, discs not visualized.  Oral cavity is clear. No oral mucosal lesions are identified. Neck is clear without evidence of cervical or supraclavicular adenopathy. Lungs are clear to A&P. Cardiac examination is essentially unremarkable with regular rate and rhythm without murmur rub or thrill. Abdomen is benign with no organomegaly or masses noted. Motor sensory and DTR levels are equal and symmetric in the upper and lower extremities. Cranial nerves II through XII are grossly intact. Proprioception is intact. No peripheral adenopathy or edema is identified. No motor or sensory levels are noted. Crude visual fields are within normal range.  RADIOLOGY RESULTS: No current films for review.  PLAN: Present time is under excellent biochemical control of his prostate cancer.  I am pleased with his overall progress.  He is a very low side effect profile.  I have  asked to see him back in 6 months for follow-up and then will start once year follow-up visits.  Patient knows to call with any concerns.  I would like to take this opportunity to thank you for allowing me to participate in the care of your patient.Noreene Filbert, MD

## 2019-11-30 ENCOUNTER — Encounter: Payer: Self-pay | Admitting: Internal Medicine

## 2019-11-30 ENCOUNTER — Ambulatory Visit (INDEPENDENT_AMBULATORY_CARE_PROVIDER_SITE_OTHER): Payer: Medicare HMO | Admitting: Internal Medicine

## 2019-11-30 ENCOUNTER — Other Ambulatory Visit: Payer: Self-pay

## 2019-11-30 VITALS — BP 110/70 | HR 71 | Temp 97.8°F | Ht 69.0 in | Wt 197.0 lb

## 2019-11-30 DIAGNOSIS — Z0000 Encounter for general adult medical examination without abnormal findings: Secondary | ICD-10-CM

## 2019-11-30 DIAGNOSIS — C61 Malignant neoplasm of prostate: Secondary | ICD-10-CM | POA: Diagnosis not present

## 2019-11-30 DIAGNOSIS — G4733 Obstructive sleep apnea (adult) (pediatric): Secondary | ICD-10-CM | POA: Diagnosis not present

## 2019-11-30 DIAGNOSIS — N401 Enlarged prostate with lower urinary tract symptoms: Secondary | ICD-10-CM | POA: Diagnosis not present

## 2019-11-30 DIAGNOSIS — Z7189 Other specified counseling: Secondary | ICD-10-CM

## 2019-11-30 DIAGNOSIS — M25562 Pain in left knee: Secondary | ICD-10-CM

## 2019-11-30 DIAGNOSIS — R002 Palpitations: Secondary | ICD-10-CM | POA: Diagnosis not present

## 2019-11-30 DIAGNOSIS — K2270 Barrett's esophagus without dysplasia: Secondary | ICD-10-CM | POA: Diagnosis not present

## 2019-11-30 DIAGNOSIS — N138 Other obstructive and reflux uropathy: Secondary | ICD-10-CM

## 2019-11-30 LAB — COMPREHENSIVE METABOLIC PANEL
ALT: 12 U/L (ref 0–53)
AST: 17 U/L (ref 0–37)
Albumin: 4.2 g/dL (ref 3.5–5.2)
Alkaline Phosphatase: 50 U/L (ref 39–117)
BUN: 18 mg/dL (ref 6–23)
CO2: 31 mEq/L (ref 19–32)
Calcium: 9 mg/dL (ref 8.4–10.5)
Chloride: 102 mEq/L (ref 96–112)
Creatinine, Ser: 1.18 mg/dL (ref 0.40–1.50)
GFR: 59.41 mL/min — ABNORMAL LOW (ref >60.00)
Glucose, Bld: 80 mg/dL (ref 70–99)
Potassium: 4.8 mEq/L (ref 3.5–5.1)
Sodium: 139 mEq/L (ref 135–145)
Total Bilirubin: 0.6 mg/dL (ref 0.2–1.2)
Total Protein: 6.8 g/dL (ref 6.0–8.3)

## 2019-11-30 LAB — T4, FREE: Free T4: 0.82 ng/dL (ref 0.60–1.60)

## 2019-11-30 LAB — PSA: PSA: 0.35 ng/mL (ref 0.10–4.00)

## 2019-11-30 LAB — CBC
HCT: 40.9 % (ref 39.0–52.0)
Hemoglobin: 13.7 g/dL (ref 13.0–17.0)
MCHC: 33.5 g/dL (ref 30.0–36.0)
MCV: 92.8 fl (ref 78.0–100.0)
Platelets: 196 10*3/uL (ref 150.0–400.0)
RBC: 4.4 Mil/uL (ref 4.22–5.81)
RDW: 13.8 % (ref 11.5–15.5)
WBC: 5.3 10*3/uL (ref 4.0–10.5)

## 2019-11-30 NOTE — Assessment & Plan Note (Signed)
Exam shows slight crepitus No effusion Normal ROM  No ligament findings Slight pain with medial meniscus testing Discussed symptom relief--heat, tylenol Ortho if worsens

## 2019-11-30 NOTE — Assessment & Plan Note (Signed)
I have personally reviewed the Medicare Annual Wellness questionnaire and have noted 1. The patient's medical and social history 2. Their use of alcohol, tobacco or illicit drugs 3. Their current medications and supplements 4. The patient's functional ability including ADL's, fall risks, home safety risks and hearing or visual             impairment. 5. Diet and physical activities 6. Evidence for depression or mood disorders  The patients weight, height, BMI and visual acuity have been recorded in the chart I have made referrals, counseling and provided education to the patient based review of the above and I have provided the pt with a written personalized care plan for preventive services.  I have provided you with a copy of your personalized plan for preventive services. Please take the time to review along with your updated medication list.  Done with colonoscopy Flu vaccine and COVID booster at [facility] Discussed adding resistance training to his walking

## 2019-11-30 NOTE — Assessment & Plan Note (Signed)
Still with some dribbling tamsulosin does help

## 2019-11-30 NOTE — Progress Notes (Signed)
Subjective:    Patient ID: Christian Carlson, male    DOB: [birth year], 78 y.o.   MRN: [id number]  HPI Here for Medicare wellness visit and follow up of chronic health condtions This visit occurred during the SARS-CoV-2 public health emergency.  Safety protocols were in place, including screening questions prior to the visit, additional usage of staff PPE, and extensive cleaning of exam room while observing appropriate contact time as indicated for disinfecting solutions.   Reviewed form and advanced directives Reviewed other doctors No alcohol or tobacco Tries to walk regularly Vision is fine Helped by hearing aides No falls No depression or anhedonia Independent with instrumental ADLs Some memory issues--like word recall  Having some left knee pain Mostly just sitting or just getting up Occasionally feels it when walking or on golf course No clear injury No swelling No meds  Did have lupron for his prostate cancer Has caused night sweats Doesn't keep him from sleeping Hasn't had a shot in a while---they are monitoring PSA  Still with dribbling in urine tamsulosin does help Notices it if he misses doses Nocturia 1-2  Omeprazole 20mg  daily No heartburn or dysphagia  Not using CPAP Sleeps well No daytime somnolence  Still gets occasional palpitations--usually when sitting at rest Feels it in neck---brief No chest pain or SOB No dizziness or syncope Current Outpatient Medications on File Prior to Visit  Medication Sig Dispense Refill  . aspirin 81 MG chewable tablet Chew 81 mg by mouth daily.    . hydrocortisone 2.5 % cream     . magnesium oxide (MAG-OX) 400 MG tablet Take 400 mg by mouth daily.    . Multiple Vitamin (MULTI-VITAMINS) TABS Take 1 tablet by mouth daily.    Marland Kitchen omeprazole (PRILOSEC) 20 MG capsule Take 20 mg by mouth daily.    . tamsulosin (FLOMAX) 0.4 MG CAPS capsule TAKE 1 CAPSULE(0.4 MG) BY MOUTH DAILY AFTER SUPPER 30 capsule 11   No current  facility-administered medications on file prior to visit.    No Known Allergies  Past Medical History:  Diagnosis Date  . Barrett's esophagus   . BPH with obstruction/lower urinary tract symptoms   . Diverticulosis   . GERD (gastroesophageal reflux disease)   . Internal hemorrhoids   . Obstructive sleep apnea   . 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 Rosacea     Past Surgical History:  Procedure Laterality Date  . CATARACT EXTRACTION, BILATERAL  2015  . ESOPHAGOGASTRODUODENOSCOPY (EGD) WITH PROPOFOL N/A 10/09/2017   Surgeon: Jonathon Bellows, MD; Location: [facility] ENDOSCOPY; REPEAT 3 YEARS 10/2020 Barrett's esophagus  . TONSILLECTOMY AND ADENOIDECTOMY      Family History  Problem Relation Age of Onset  . Stroke Father   . Diabetes Father   . Hypertension Sister   . Stroke Brother   . Esophageal cancer Brother   . Heart disease Neg Hx     Social History   Socioeconomic History  . Marital status: Married    Spouse name: Not on file  . Number of children: 2  . Years of education: Not on file  . Highest education level: Not on file  Occupational History  . Occupation: Primary school teacher    Comment: Retired  Tobacco Use  . Smoking status: Former Research scientist (life sciences)  . Smokeless tobacco: Never Used  Substance and Sexual Activity  . Alcohol use: No    Comment: quit 2 years  ago with wife's breast cancer  . Drug use: Not on file  . Sexual activity: Not on file  Other Topics Concern  . Not on file  Social History Narrative   Has living will   Wife, then daughter, should make health care decisions   Would accept resuscitation but no prolonged ventilation   No tube feeds if cognitively unaware   Social Determinants of Health   Financial Resource Strain:   . Difficulty of Paying Living Expenses: Not on file  Food Insecurity:   . Worried About Charity fundraiser in the Last Year: Not on file  . Ran Out of Food in the  Last Year: Not on file  Transportation Needs:   . Lack of Transportation (Medical): Not on file  . Lack of Transportation (Non-Medical): Not on file  Physical Activity:   . Days of Exercise per Week: Not on file  . Minutes of Exercise per Session: Not on file  Stress:   . Feeling of Stress : Not on file  Social Connections:   . Frequency of Communication with Friends and Family: Not on file  . Frequency of Social Gatherings with Friends and Family: Not on file  . Attends Religious Services: Not on file  . Active Member of Clubs or Organizations: Not on file  . Attends Archivist Meetings: Not on file  . Marital Status: Not on file  Intimate Partner Violence:   . Fear of Current or Ex-Partner: Not on file  . Emotionally Abused: Not on file  . Physically Abused: Not on file  . Sexually Abused: Not on file   Review of Systems Appetite is good Weight stable Sleeps well Wears seat belt Teeth are okay--keeps iup No rash or suspicious skin lesions No other significant back or joint pains    Objective:   Physical Exam HENT:     Mouth/Throat:     Comments: No oral lesions Eyes:     Conjunctiva/sclera: Conjunctivae normal.     Pupils: Pupils are equal, round, and reactive to light.  Cardiovascular:     Rate and Rhythm: Normal rate and regular rhythm.     Pulses: Normal pulses.     Heart sounds: No murmur heard.  No gallop.   Pulmonary:     Effort: Pulmonary effort is normal.     Breath sounds: Normal breath sounds. No wheezing or rales.  Abdominal:     Palpations: Abdomen is soft.     Tenderness: There is no abdominal tenderness.  Musculoskeletal:     Cervical back: Neck supple.     Right lower leg: No edema.     Left lower leg: No edema.  Lymphadenopathy:     Cervical: No cervical adenopathy.  Skin:    General: Skin is warm.     Findings: No rash.  Neurological:     Mental Status: He is alert and oriented to person, place, and time.     Comments:  President---"Biden, Trump, not Bush---[name], no--Obama" [id number] Recall 3/3  Psychiatric:        Mood and Affect: Mood normal.        Behavior: Behavior normal.            Assessment & Plan:

## 2019-11-30 NOTE — Progress Notes (Signed)
Hearing Screening   125Hz 250Hz 500Hz 1000Hz 2000Hz 3000Hz 4000Hz 6000Hz 8000Hz  Right ear:           Left ear:           Comments: Has hearing aids. Wearing them today.  Vision Screening Comments: June 2021   

## 2019-11-30 NOTE — Assessment & Plan Note (Signed)
No symptoms on daily omeprazole 20 Not pursuing EGD at this point

## 2019-11-30 NOTE — Assessment & Plan Note (Signed)
See social history 

## 2019-11-30 NOTE — Assessment & Plan Note (Signed)
Will recheck PSA If up, will need lupron again

## 2019-11-30 NOTE — Assessment & Plan Note (Signed)
Still once in a while Doesn't sound like SVT

## 2019-11-30 NOTE — Assessment & Plan Note (Signed)
No daytime somnolence off the CPAP

## 2020-01-27 ENCOUNTER — Other Ambulatory Visit: Payer: Self-pay | Admitting: *Deleted

## 2020-01-27 ENCOUNTER — Other Ambulatory Visit: Payer: Self-pay

## 2020-01-27 ENCOUNTER — Inpatient Hospital Stay: Payer: Medicare HMO | Attending: Radiation Oncology

## 2020-01-27 DIAGNOSIS — C61 Malignant neoplasm of prostate: Secondary | ICD-10-CM | POA: Diagnosis not present

## 2020-01-27 LAB — PSA: Prostatic Specific Antigen: 0.12 ng/mL (ref 0.00–4.00)

## 2020-02-03 ENCOUNTER — Other Ambulatory Visit: Payer: Self-pay

## 2020-02-03 ENCOUNTER — Ambulatory Visit
Admission: RE | Admit: 2020-02-03 | Discharge: 2020-02-03 | Disposition: A | Discharge status: Home / Self Care | Payer: Medicare HMO | Source: Ambulatory Visit | Attending: Radiation Oncology | Admitting: Radiation Oncology

## 2020-02-03 ENCOUNTER — Encounter: Payer: Self-pay | Admitting: Radiation Oncology

## 2020-02-03 VITALS — BP 139/89 | HR 72 | Temp 96.0°F | Wt 198.0 lb

## 2020-02-03 DIAGNOSIS — C61 Malignant neoplasm of prostate: Secondary | ICD-10-CM

## 2020-02-03 NOTE — Progress Notes (Signed)
Radiation Oncology Follow up Note  Name: Christian Carlson   Date:   02/03/2020 MRN:  [id number] DOB: July 06, 1940    This 79 y.o. 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  REFERRING PROVIDER: Venia Carbon, MD  HPI: Patient is a an 79 year old male now out 16 months having completed IMRT radiation therapy for stage IIa adenocarcinoma the prostate seen today in routine follow-up he is doing well.  Specifically denies any increased lower urinary tract symptoms diarrhea or fatigue.  His PSA continues to decline his most recent value was.  4.25  COMPLICATIONS OF TREATMENT: none  FOLLOW UP COMPLIANCE: keeps appointments   PHYSICAL EXAM:  BP 139/89   Pulse 72   Temp (!) 96 F (35.6 C) (Tympanic)   Wt 198 lb (89.8 kg)   BMI 29.24 kg/m  Well-developed well-nourished patient in NAD. HEENT reveals PERLA, EOMI, discs not visualized.  Oral cavity is clear. No oral mucosal lesions are identified. Neck is clear without evidence of cervical or supraclavicular adenopathy. Lungs are clear to A&P. Cardiac examination is essentially unremarkable with regular rate and rhythm without murmur rub or thrill. Abdomen is benign with no organomegaly or masses noted. Motor sensory and DTR levels are equal and symmetric in the upper and lower extremities. Cranial nerves II through XII are grossly intact. Proprioception is intact. No peripheral adenopathy or edema is identified. No motor or sensory levels are noted. Crude visual fields are within normal range.  RADIOLOGY RESULTS: No current imaging for review  PLAN: Present time patient is under excellent biochemical control of his prostate cancer.  And pleased with his overall progress.  I have asked to see him back in 1 year for follow-up.  Patient knows to call with any concerns at any time.  I would like to take  this opportunity to thank you for allowing me to participate in the care of your patient.Noreene Filbert, MD

## 2020-03-23 ENCOUNTER — Telehealth: Payer: Self-pay

## 2020-03-23 ENCOUNTER — Encounter: Payer: Self-pay | Admitting: Internal Medicine

## 2020-03-23 NOTE — Telephone Encounter (Signed)
[place] Day - Client TELEPHONE ADVICE RECORD [facility] Patient Name: Christian Carlson Gender: Male DOB: 11-05-1940 Age: 80 Y 2 M 25 D Return Phone Number: [phone] (Primary), [phone] (Secondary) Address: City/State/Zip: [place] [address] Client [facility] - Client Client Site [place] - Day Physician Viviana Simpler- MD Contact Type Call Who Is Calling Patient / Member / Family / Caregiver Call Type Triage / Clinical Relationship To Patient Self Return Phone Number [phone] (Primary) Chief Complaint Fever (non urgent symptom) (> THREE MONTHS) Reason for Call Symptomatic / Request for [place] states he is experiencing a head cold with fever of 100.9 Translation No Nurse Assessment Nurse: Thad Ranger, RN, Langley Gauss Date/Time (Eastern Time): 03/23/2020 12:41:24 PM Confirm and document reason for call. If symptomatic, describe symptoms. ---Pt states he has a cough, sore throat, nasal congestion, fever of 100.9. Home covid test on Tues was neg. Does the patient have any new or worsening symptoms? ---Yes Will a triage be completed? ---Yes Related visit to physician within the last 2 weeks? ---No Does the PT have any chronic conditions? (i.e. diabetes, asthma, this includes High risk factors for pregnancy, etc.) ---No Is this a behavioral health or substance abuse call? ---No Guidelines Guideline Title Affirmed Question Affirmed Notes Nurse Date/Time ([place] Time) COVID-19 - Diagnosed or Suspected [1] COVID-19 infection suspected by caller or triager AND [2] mild symptoms (cough, fever, or others) AND [3] negative COVID-19 rapid test Carmon, RN, Denise 03/23/2020 12:43:05 PM Disp. Time Eilene Ghazi Time) Disposition Final User 03/23/2020 12:37:32 PM Send To RN Personal Robby Sermon, RN, April 03/23/2020 12:40:07 PM Attempt made - line busy [place], RN, Langley Gauss 03/23/2020  12:47:51 PM Call PCP when Office is Open Yes Carmon, RN, Langley Gauss PLEASE NOTE: All timestamps contained within this report are represented as [place] Standard Time. CONFIDENTIALTY NOTICE: This fax transmission is intended only for the addressee. It contains information that is legally privileged, confidential or otherwise protected from use or disclosure. If you are not the intended recipient, you are strictly prohibited from reviewing, disclosing, copying using or disseminating any of this information or taking any action in reliance on or regarding this information. If you have received this fax in error, please notify us immediately by telephone so that we can arrange for its return to [place]. Phone: [phone], Toll-Free: [phone], Fax: [phone] Page: 2 of 2 Call Id: [id number] [place] Disagree/Comply Comply Caller Understands Yes PreDisposition Call Doctor Care Advice Given Per Guideline CALL PCP WHEN OFFICE IS OPEN: GENERAL CARE ADVICE FOR COVID-19 SYMPTOMS: * The treatment is the same whether you have COVID-19, influenza or some other respiratory virus. * Cough: Use cough drops. * Feeling dehydrated: Drink extra liquids. If the air in your home is dry, use a humidifier. * Fever: For fever over 101 F (38.3 C), take acetaminophen every 4 to 6 hours (Adults 650 mg) OR ibuprofen every 6 to 8 hours (Adults 400 mg). Before taking any medicine, read all the instructions on the package. Do not take aspirin unless your doctor has prescribed it for you. * Muscle aches, headache, and other pains: Often this comes and goes with the fever. Take acetaminophen every 4 to 6 hours (Adults 650 mg) OR ibuprofen every 6 to 8 hours (Adults 400 mg). Before taking any medicine, read all the instructions on the package. * Sore throat: Try throat lozenges, hard candy or warm chicken broth. COUGH MEDICINES: * COUGH DROPS: Over-the-counter cough drops can help  a lot, especially for mild coughs. They soothe an  irritated throat and remove the tickle sensation in the back of the throat. Cough drops are easy to carry with you. * COUGH SYRUP WITH DEXTROMETHORPHAN: An over-the-counter cough syrup can help your cough. The most common cough suppressant in over-the-counter cough medicines is dextromethorphan. * HOME REMEDY - HONEY: This old home remedy has been shown to help decrease coughing at night. The adult dosage is 2 teaspoons (10 ml) at bedtime. Honey should not be given to infants under one year of age. HUMIDIFIER: * If the air is dry, use a humidifier in the bedroom. AVOID TOBACCO SMOKE: * Avoid tobacco smoke. HOW TO PROTECT OTHERS - WHEN YOU ARE SICK WITH COVID-19: * STAY HOME A MINIMUM OF 10 DAYS: Home isolation is needed for at least 10 days after the symptoms started. Stay home from school or work if you are sick. Do NOT go to religious services, child care centers, shopping, or other public places. Do NOT use public transportation (e.g., bus, taxis, ride-sharing). Do NOT allow any visitors to your home. Leave the house only if you need to seek urgent medical care. * COVER THE COUGH: Cough and sneeze into your shirt sleeve or inner elbow. Don't cough into your hand or the air. If available, cough into a tissue and throw it into a trash can. * [place] HANDS OFTEN: Wash hands often with soap and water. After coughing or sneezing are important times. If soap and water are not available, use an alcohol-based hand sanitizer with at least 60% alcohol, covering all surfaces of your hands and rubbing them together until they feel dry. Avoid touching your eyes, nose, and mouth with unwashed hands. * WEAR A MASK: Wear a facemask when around others. Always wear a facemask (if available) if you have to leave your home (such as going to a medical facility). CALL BACK IF: * Fever lasts over 3 days * Chest pain or difficulty breathing occurs * You become worse CARE ADVICE given per COVID-19 - DIAGNOSED OR SUSPECTED  (Adult) guideline. Referrals REFERRED TO PCP OFFICE

## 2020-03-23 NOTE — Telephone Encounter (Signed)
Pt already has video visit schedule with [facility] on 03/24/20 with Avie Echevaria NP.

## 2020-03-24 ENCOUNTER — Telehealth (INDEPENDENT_AMBULATORY_CARE_PROVIDER_SITE_OTHER): Payer: Medicare HMO | Admitting: Internal Medicine

## 2020-03-24 VITALS — Temp 98.2°F | Ht 69.0 in | Wt 198.0 lb

## 2020-03-24 DIAGNOSIS — J069 Acute upper respiratory infection, unspecified: Secondary | ICD-10-CM | POA: Diagnosis not present

## 2020-03-24 MED ORDER — AZITHROMYCIN 250 MG PO TABS: ORAL_TABLET | ORAL | 0 refills | Status: DC

## 2020-03-24 NOTE — Telephone Encounter (Signed)
Discussed at [facility] today 

## 2020-03-25 ENCOUNTER — Encounter: Payer: Self-pay | Admitting: Internal Medicine

## 2020-03-25 NOTE — Patient Instructions (Signed)

## 2020-03-25 NOTE — Progress Notes (Signed)
Virtual Visit via Video Note  I connected with Christian Carlson on 03/25/20 at  9:00 AM EST by a video enabled telemedicine application and verified that I am speaking with the correct person using two identifiers.  Location: Patient: Home Provider: Home  Persons participating in this video call: Webb Silversmith, NP-D and Christian Carlson.  I discussed the limitations of evaluation and management by telemedicine and the availability of in person appointments. The patient expressed understanding and agreed to proceed.  History of Present Illness:  Patient presents the clinic today with complaint of headache, runny nose, sore throat, cough and chest tightness.  He reports this started 6 days ago.  He is blowing yellow mucus out of his nose.  He denies difficulty swallowing but is having some postnasal drip and hoarseness.  He denies dizziness, nasal congestion, ear pain, loss of taste or smell or shortness of breath.  He has run fever up to 100.9 but denies chills or body aches.  He has tried Tylenol, NyQuil and Tessalon with minimal relief of symptoms.  He had a negative COVID test 4 days ago.  He has had all of his COVID vaccines.    Past Medical History:  Diagnosis Date  . Barrett's esophagus   . BPH with obstruction/lower urinary tract symptoms   . Diverticulosis   . GERD (gastroesophageal reflux disease)   . Internal hemorrhoids   . Obstructive sleep apnea   . 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 Rosacea   . 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 aspirin 81 MG chewable tablet Chew 81 mg by mouth daily.    Marland Kitchen azithromycin (ZITHROMAX) 250 MG tablet Take 2 tabs today, then 1 tab daily x 4 days 6 tablet 0  . hydrocortisone 2.5 % cream     . magnesium oxide (MAG-OX) 400 MG tablet Take 400 mg by mouth daily.    . Multiple  Vitamin (MULTI-VITAMINS) TABS Take 1 tablet by mouth daily.    Marland Kitchen omeprazole (PRILOSEC) 20 MG capsule Take 20 mg by mouth daily.    . tamsulosin (FLOMAX) 0.4 MG CAPS capsule TAKE 1 CAPSULE(0.4 MG) BY MOUTH DAILY AFTER SUPPER 30 capsule 11  . Zinc 50 MG CAPS      No current facility-administered medications for this visit.    No Known Allergies  Family History  Problem Relation Age of Onset  . Stroke Father   . Diabetes Father   . Hypertension Sister   . Stroke Brother   . Esophageal cancer Brother   . Heart disease Neg Hx     Social History   Socioeconomic History  . Marital status: Married    Spouse name: Not on file  . Number of children: 2  . Years of education: Not on file  . Highest education level: Not on file  Occupational History  . Occupation: Primary school teacher    Comment: Retired  Tobacco Use  . Smoking status: Former Research scientist (life sciences)  . Smokeless tobacco: Never Used  Substance and Sexual Activity  . Alcohol use: No    Comment: quit 2 years ago with wife's breast cancer  . Drug use: Not on file  . Sexual activity: Not on file  Other Topics Concern  . Not on file  Social History Narrative   Has  living will   Wife, then daughter, should make health care decisions   Would accept resuscitation but no prolonged ventilation   No tube feeds if cognitively unaware   Social Determinants of Health   Financial Resource Strain: Not on file  Food Insecurity: Not on file  Transportation Needs: Not on file  Physical Activity: Not on file  Stress: Not on file  Social Connections: Not on file  Intimate Partner Violence: Not on file     Constitutional: Patient reports headache, fever.  Denies malaise, fatigue, or abrupt weight changes.  HEENT: Patient reports runny nose, sore throat.  Denies eye pain, eye redness, ear pain, ringing in the ears, wax buildup, nasal congestion, bloody nose. Respiratory: Patient reports cough.  Denies difficulty breathing, shortness of  breath, or sputum production.   Cardiovascular: Patient reports chest tightness.  Denies chest pain,  palpitations or swelling in the hands or feet.  Gastrointestinal: Denies abdominal pain, bloating, constipation, diarrhea or blood in the stool.   No other specific complaints in a complete review of systems (except as listed in HPI above).  Observations/Objective: Temp 98.2 F (36.8 C) (Temporal)   Ht 5\' 9"  (1.753 m)   Wt 198 lb (89.8 kg)   BMI 29.24 kg/m  Wt Readings from Last 3 Encounters:  03/23/20 198 lb (89.8 kg)  02/03/20 198 lb (89.8 kg)  11/30/19 197 lb (89.4 kg)    General: Appears his stated age,  in NAD. HEENT: Head: normal shape and size; Nose: Congestion noted; Throat/Mouth: Hoarseness noted.  Neck:  Neck supple, trachea midline. No masses, lumps or thyromegaly present.  Pulmonary/Chest: Normal effort.  Wet cough noted.  No respiratory distress. Neurological: Alert and oriented.   BMET    Component Value Date/Time   NA 139 11/30/2019 1237   K 4.8 11/30/2019 1237   CL 102 11/30/2019 1237   CO2 31 11/30/2019 1237   GLUCOSE 80 11/30/2019 1237   BUN 18 11/30/2019 1237   CREATININE 1.18 11/30/2019 1237   CALCIUM 9.0 11/30/2019 1237    Lipid Panel  No results found for: CHOL, TRIG, HDL, CHOLHDL, VLDL, LDLCALC  CBC    Component Value Date/Time   WBC 5.3 11/30/2019 1237   RBC 4.40 11/30/2019 1237   HGB 13.7 11/30/2019 1237   HCT 40.9 11/30/2019 1237   PLT 196.0 11/30/2019 1237   MCV 92.8 11/30/2019 1237   MCH 30.6 08/19/2018 1400   MCHC 33.5 11/30/2019 1237   RDW 13.8 11/30/2019 1237    Hgb A1C No results found for: HGBA1C      Assessment and Plan:  Upper Respiratory Infection with Cough:  Negative COVID test No indication to repeat testing at this time Rx for Azithromycin x5 days Would recommend antihistamine OTC Continue NyQuil and Tessalon for cough  Follow-up precautions discussed  Follow Up Instructions:    I discussed the  assessment and treatment plan with the patient. The patient was provided an opportunity to ask questions and all were answered. The patient agreed with the plan and demonstrated an understanding of the instructions.   The patient was advised to call back or seek an in-person evaluation if the symptoms worsen or if the condition fails to improve as anticipated.   Webb Silversmith, NP ;

## 2020-03-29 ENCOUNTER — Encounter: Payer: Medicare HMO | Admitting: Dermatology

## 2020-06-05 ENCOUNTER — Other Ambulatory Visit: Payer: Self-pay | Admitting: Radiation Oncology

## 2020-06-21 ENCOUNTER — Ambulatory Visit: Payer: Medicare HMO | Admitting: Dermatology

## 2020-06-21 ENCOUNTER — Other Ambulatory Visit: Payer: Self-pay

## 2020-06-21 ENCOUNTER — Encounter: Payer: Self-pay | Admitting: Dermatology

## 2020-06-21 DIAGNOSIS — L820 Inflamed seborrheic keratosis: Secondary | ICD-10-CM

## 2020-06-21 DIAGNOSIS — Z872 Personal history of diseases of the skin and subcutaneous tissue: Secondary | ICD-10-CM | POA: Diagnosis not present

## 2020-06-21 DIAGNOSIS — Z1283 Encounter for screening for malignant neoplasm of skin: Secondary | ICD-10-CM

## 2020-06-21 DIAGNOSIS — L821 Other seborrheic keratosis: Secondary | ICD-10-CM

## 2020-06-21 DIAGNOSIS — D229 Melanocytic nevi, unspecified: Secondary | ICD-10-CM

## 2020-06-21 DIAGNOSIS — L578 Other skin changes due to chronic exposure to nonionizing radiation: Secondary | ICD-10-CM

## 2020-06-21 DIAGNOSIS — Z85828 Personal history of other malignant neoplasm of skin: Secondary | ICD-10-CM

## 2020-06-21 DIAGNOSIS — L570 Actinic keratosis: Secondary | ICD-10-CM

## 2020-06-21 DIAGNOSIS — D1800 Hemangioma unspecified site: Secondary | ICD-10-CM

## 2020-06-21 DIAGNOSIS — L905 Scar conditions and fibrosis of skin: Secondary | ICD-10-CM

## 2020-06-21 DIAGNOSIS — L814 Other melanin hyperpigmentation: Secondary | ICD-10-CM

## 2020-06-21 DIAGNOSIS — B353 Tinea pedis: Secondary | ICD-10-CM

## 2020-06-21 NOTE — Patient Instructions (Signed)

## 2020-06-21 NOTE — Progress Notes (Signed)
Follow-Up Visit   Subjective  Christian Carlson is a 80 y.o. male who presents for the following: Annual Exam (Hx of AK's, SCC, tinea pedis, and tinea unguium - pt currently using Ciclopirox cream once a day and has noticed an improvement on his feet and toenails. He noticed a crusted lesion on the L upper arm that he would like checked today ).The patient presents for Total-Body Skin Exam (TBSE) for skin cancer screening and mole check.  The following portions of the chart were reviewed this encounter and updated as appropriate:   Tobacco  Allergies  Meds  Problems  Med Hx  Surg Hx  Fam Hx     Review of Systems:  No other skin or systemic complaints except as noted in HPI or Assessment and Plan.  Objective  Well appearing patient in no apparent distress; mood and affect are within normal limits.  A full examination was performed including scalp, head, eyes, ears, nose, lips, neck, chest, axillae, abdomen, back, buttocks, bilateral upper extremities, bilateral lower extremities, hands, feet, fingers, toes, fingernails, and toenails. All findings within normal limits unless otherwise noted below.  Objective  L upper arm: Erythematous keratotic or waxy stuck-on papule or plaque.   Objective  B/L feet: Scaling and maceration web spaces and over distal and lateral soles.   Objective  L post auricular scalp: Dyspigmented smooth macule or patch.   Assessment & Plan  Inflamed seborrheic keratosis L upper arm  Patient deferred treatment at this time.   Tinea pedis of left foot B/L feet  Start Ciclopirox cream BID to entire foot.   Scar L post auricular scalp  Benign-appearing.  Observation.  Call clinic for new or changing lesions.  Recommend daily use of broad spectrum spf 30+ sunscreen to sun-exposed areas.     Lentigines - Scattered tan macules - Due to sun exposure - Benign-appering, observe - Recommend daily broad spectrum sunscreen SPF 30+ to sun-exposed areas,  reapply every 2 hours as needed. - Call for any changes  Seborrheic Keratoses - Stuck-on, waxy, tan-brown papules and/or plaques  - Benign-appearing - Discussed benign etiology and prognosis. - Observe - Call for any changes  Melanocytic Nevi - Tan-brown and/or pink-flesh-colored symmetric macules and papules - Benign appearing on exam today - Observation - Call clinic for new or changing moles - Recommend daily use of broad spectrum spf 30+ sunscreen to sun-exposed areas.   Hemangiomas - Red papules - Discussed benign nature - Observe - Call for any changes  Actinic Damage - Severe, confluent actinic changes with pre-cancerous actinic keratoses  - Severe, chronic, not at goal, secondary to cumulative UV radiation exposure over time - diffuse scaly erythematous macules and papules with underlying dyspigmentation - Discussed Prescription "Field Treatment" for Severe, Chronic Confluent Actinic Changes with Pre-Cancerous Actinic Keratoses Field treatment involves treatment of an entire area of skin that has confluent Actinic Changes (Sun/ Ultraviolet light damage) and PreCancerous Actinic Keratoses by method of PhotoDynamic Therapy (PDT) and/or prescription Topical Chemotherapy agents such as 5-fluorouracil, 5-fluorouracil/calcipotriene, and/or imiquimod.  The purpose is to decrease the number of clinically evident and subclinical PreCancerous lesions to prevent progression to development of skin cancer by chemically destroying early precancer changes that may or may not be visible.  It has been shown to reduce the risk of developing skin cancer in the treated area. As a result of treatment, redness, scaling, crusting, and open sores may occur during treatment course. One or more than one of these methods may be  used and may have to be used several times to control, suppress and eliminate the PreCancerous changes. Discussed treatment course, expected reaction, and possible side effects. -  Recommend daily broad spectrum sunscreen SPF 30+ to sun-exposed areas, reapply every 2 hours as needed.  - Staying in the shade or wearing long sleeves, sun glasses (UVA+UVB protection) and wide brim hats (4-inch brim around the entire circumference of the hat) are also recommended. - Call for new or changing lesions. - Plan PDT to frontal scalp and face   History of Squamous Cell Carcinoma of the Skin - L low back (2019) - No evidence of recurrence today - No lymphadenopathy - Recommend regular full body skin exams - Recommend daily broad spectrum sunscreen SPF 30+ to sun-exposed areas, reapply every 2 hours as needed.  - Call if any new or changing lesions are noted between office visits  Skin cancer screening performed today.  Return in about 3 months (around 09/20/2020) for Ak's; plan PDT in 4-6 weeks of the face and in 2-4 weeks of the scalp.  Luther Redo, CMA, am acting as scribe for Forest Gleason, MD .  Documentation: I have reviewed the above documentation for accuracy and completeness, and I agree with the above.  Forest Gleason, MD

## 2020-07-02 ENCOUNTER — Encounter: Payer: Self-pay | Admitting: Dermatology

## 2020-07-19 ENCOUNTER — Other Ambulatory Visit: Payer: Self-pay

## 2020-07-19 ENCOUNTER — Ambulatory Visit (INDEPENDENT_AMBULATORY_CARE_PROVIDER_SITE_OTHER): Payer: Medicare HMO

## 2020-07-19 DIAGNOSIS — L570 Actinic keratosis: Secondary | ICD-10-CM | POA: Diagnosis not present

## 2020-07-19 MED ORDER — AMINOLEVULINIC ACID HCL 20 % EX SOLR
1.0000 "application " | Freq: Once | CUTANEOUS | Status: AC
  Administered 2020-07-19: 354 mg via TOPICAL

## 2020-07-19 NOTE — Progress Notes (Signed)
Patient completed PDT therapy today.  1. AK (actinic keratosis) Scalp  Photodynamic therapy - Scalp Procedure discussed: discussed risks, benefits, side effects. and alternatives   Prep: site scrubbed/prepped with acetone   Location:  Scalp Number of lesions:  Multiple Type of treatment:  Blue light Aminolevulinic Acid (see MAR for details): Levulan Number of Levulan sticks used:  1 Incubation time (minutes):  120 Number of minutes under lamp:  16 Number of seconds under lamp:  40 Cooling:  Floor fan Outcome: patient tolerated procedure well with no complications   Post-procedure details: sunscreen applied    Aminolevulinic Acid HCl 20 % SOLR 354 mg - Scalp    

## 2020-07-19 NOTE — Patient Instructions (Signed)

## 2020-07-31 ENCOUNTER — Other Ambulatory Visit: Payer: Self-pay | Admitting: Dermatology

## 2020-08-09 ENCOUNTER — Other Ambulatory Visit: Payer: Self-pay

## 2020-08-09 ENCOUNTER — Ambulatory Visit: Payer: Medicare HMO

## 2020-08-09 DIAGNOSIS — L570 Actinic keratosis: Secondary | ICD-10-CM | POA: Diagnosis not present

## 2020-08-09 MED ORDER — AMINOLEVULINIC ACID HCL 20 % EX SOLR
1.0000 "application " | Freq: Once | CUTANEOUS | Status: AC
  Administered 2020-08-09: 354 mg via TOPICAL

## 2020-08-09 NOTE — Progress Notes (Signed)
Patient completed PDT therapy today.  1. AK (actinic keratosis) Head - Anterior (Face)  Photodynamic therapy - Head - Anterior (Face) Procedure discussed: discussed risks, benefits, side effects. and alternatives   Prep: site scrubbed/prepped with acetone   Location:  Face Number of lesions:  Multiple Type of treatment:  Blue light Aminolevulinic Acid (see MAR for details): Levulan Number of Levulan sticks used:  1 Number of minutes under lamp:  16 Number of seconds under lamp:  40 Cooling:  Floor fan Outcome: patient tolerated procedure well with no complications   Post-procedure details: sunscreen applied    Aminolevulinic Acid HCl 20 % SOLR 354 mg - Head - Anterior (Face)

## 2020-08-09 NOTE — Patient Instructions (Signed)

## 2020-09-23 NOTE — Progress Notes (Signed)
Ivalene Platte T. Jirah Rider, MD, [place] [facility] [place], [address]  Phone: [phone]  FAX: [phone]  Ashrith Sagan - 80 y.o. male  MRN [id number]  Date of Birth: 24-Dec-1940  Date: 09/24/2020  PCP: Venia Carbon, MD  Referral: Venia Carbon, MD  Chief Complaint  Patient presents with   Knee Pain    Left    This visit occurred during the SARS-CoV-2 public health emergency.  Safety protocols were in place, including screening questions prior to the visit, additional usage of staff PPE, and extensive cleaning of exam room while observing appropriate contact time as indicated for disinfecting solutions.   Subjective:   Maxum Cassarino is a 80 y.o. very pleasant male patient with Body mass index is 30.05 kg/m. who presents with the following:  He is a new patient to me with evaluation of left-sided knee pain.  Medial knee pain.  He is in general quite active, and he works in his garden, flower beds all the time.  He also plays a full round of golf once a week.  His pain is predominantly in the left side, medial aspect of the knee.  Currently today in the office is really not bothering him all that much, but sometimes when he loads the knee or for some rotational movement it does does hurt.  He does not have any effusion, and no mechanical symptoms.  He has no major history of prior knee injury or operative care.  He also does have a third digit trigger finger on the left its been present for about 5 to 10 years.  This does not hurt him a lot of the time particular in the morning.  Review of Systems is noted in the HPI, as appropriate   Objective:   BP 110/70   Pulse 67   Temp (!) 97.4 F (36.3 C) (Temporal)   Ht 5\' 9"  (1.753 m)   Wt 203 lb 8 oz (92.3 kg)   SpO2 97%   BMI 30.05 kg/m   GEN: No acute distress; alert,appropriate. PULM: Breathing comfortably in no respiratory distress PSYCH: Normally  interactive.    Left knee: Full extension and flexion to 130.  Nontender at the patellar facets.  Medial greater than lateral joint line tenderness.  Stable to varus and valgus stress.  Lachman and drawer testing are all normal.  Nontender with flexion pinch and McMurray's testing.  No effusion.  He does have a obvious trigger finger on the third digit on the left side.  Radiology: No results found.  Assessment and Plan:     ICD-10-CM   1. Acute pain of left knee  M25.562 DG Knee 4 Views W/Patella Left    2. 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 Primary osteoarthritis of left knee  M17.12      On the Lutricia Feil view, the patient does show some minimal to mild medial compartmental joint space narrowing.  Right now, his knee is not really bothering him all that much, so I think that doing some occasional ibuprofen and Tylenol is probably all he needs to do.  Remain physically active and keep his legs and hip strong and weight under control.  We reviewed anatomy trigger fingers, and I am going to do a trigger finger injection today for him.  Tendon Sheath Injection Procedure Note Nils Thor [birth year] Date of procedure: 09/24/2020  Procedure: Tendon Sheath Injection for Trigger Finger, L 3rd Indications: Pain  Procedure Details Verbal consent was obtained. Risks (including potential risk for skin lightening and potential atrophy), benefits and alternatives were discussed. Prepped with Chloraprep and Ethyl Chloride used for anesthesia. Under sterile conditions, patient injected at palmar crease aiming distally with 45 degree angle towards nodule; injected directly into tendon sheath. Medication flowed freely without resistance.  Needle size: 22 gauge 1 1/2 inch Injection: 1/2 cc of Lidocaine 1% and Kenalog 20 mg Medication: 1/2 cc of Kenalog 40 mg (equaling Kenalog 20 mg)   Meds ordered this encounter   Medications   triamcinolone acetonide (KENALOG-40) injection 20 mg   Medications Discontinued During This Encounter  Medication Reason   azithromycin (ZITHROMAX) 250 MG tablet Completed Course   Orders Placed This Encounter  Procedures   DG Knee 4 Views W/Patella Left    Follow-up: As needed only  Signed,  Jalyiah Shelley T. Normajean Nash, MD   Outpatient Encounter Medications as of 09/24/2020  Medication Sig   aspirin 81 MG chewable tablet Chew 81 mg by mouth daily.   ciclopirox (LOPROX) 0.77 % cream APPLY AS DIRECTED APPLY TO FEET AND IN BETWEEN TOES TWICE DAILY   hydrocortisone 2.5 % cream    magnesium oxide (MAG-OX) 400 MG tablet Take 400 mg by mouth daily.   Multiple Vitamin (MULTI-VITAMINS) TABS Take 1 tablet by mouth daily.   omeprazole (PRILOSEC) 20 MG capsule Take 20 mg by mouth daily.   tamsulosin (FLOMAX) 0.4 MG CAPS capsule TAKE 1 CAPSULE(0.4 MG) BY MOUTH DAILY AFTER SUPPER   Zinc 50 MG CAPS    [DISCONTINUED] azithromycin (ZITHROMAX) 250 MG tablet Take 2 tabs today, then 1 tab daily x 4 days   [EXPIRED] triamcinolone acetonide (KENALOG-40) injection 20 mg    No facility-administered encounter medications on file as of 09/24/2020.

## 2020-09-24 ENCOUNTER — Other Ambulatory Visit: Payer: Self-pay

## 2020-09-24 ENCOUNTER — Ambulatory Visit (INDEPENDENT_AMBULATORY_CARE_PROVIDER_SITE_OTHER): Payer: Medicare HMO | Admitting: Family Medicine

## 2020-09-24 ENCOUNTER — Ambulatory Visit (INDEPENDENT_AMBULATORY_CARE_PROVIDER_SITE_OTHER)
Admission: RE | Admit: 2020-09-24 | Discharge: 2020-09-24 | Disposition: A | Discharge status: Home / Self Care | Payer: Medicare HMO | Source: Ambulatory Visit | Attending: Family Medicine | Admitting: Family Medicine

## 2020-09-24 ENCOUNTER — Encounter: Payer: Self-pay | Admitting: Family Medicine

## 2020-09-24 VITALS — BP 110/70 | HR 67 | Temp 97.4°F | Ht 69.0 in | Wt 203.5 lb

## 2020-09-24 DIAGNOSIS — M1712 Unilateral primary osteoarthritis, left knee: Secondary | ICD-10-CM

## 2020-09-24 DIAGNOSIS — M65332 Trigger finger, left middle finger: Secondary | ICD-10-CM

## 2020-09-24 DIAGNOSIS — M25562 Pain in left knee: Secondary | ICD-10-CM

## 2020-09-24 MED ORDER — TRIAMCINOLONE ACETONIDE 40 MG/ML IJ SUSP
20.0000 mg | Freq: Once | INTRAMUSCULAR | Status: AC
  Administered 2020-09-24: 20 mg via INTRA_ARTICULAR

## 2020-10-18 ENCOUNTER — Ambulatory Visit: Payer: Medicare HMO | Admitting: Dermatology

## 2020-10-18 ENCOUNTER — Other Ambulatory Visit: Payer: Self-pay

## 2020-10-18 DIAGNOSIS — L821 Other seborrheic keratosis: Secondary | ICD-10-CM | POA: Diagnosis not present

## 2020-10-18 DIAGNOSIS — Z872 Personal history of diseases of the skin and subcutaneous tissue: Secondary | ICD-10-CM

## 2020-10-18 DIAGNOSIS — L578 Other skin changes due to chronic exposure to nonionizing radiation: Secondary | ICD-10-CM

## 2020-10-18 DIAGNOSIS — Z86006 Personal history of melanoma in-situ: Secondary | ICD-10-CM

## 2020-10-18 DIAGNOSIS — D1800 Hemangioma unspecified site: Secondary | ICD-10-CM

## 2020-10-18 DIAGNOSIS — D485 Neoplasm of uncertain behavior of skin: Secondary | ICD-10-CM

## 2020-10-18 DIAGNOSIS — L570 Actinic keratosis: Secondary | ICD-10-CM

## 2020-10-18 DIAGNOSIS — L738 Other specified follicular disorders: Secondary | ICD-10-CM | POA: Diagnosis not present

## 2020-10-18 NOTE — Progress Notes (Signed)
Follow-Up Visit   Subjective  Christian Carlson is a 80 y.o. male who presents for the following: Follow-up (Patient here today for 3 month AK follow up at face and scalp. Patient has had PDT to scalp and face since last appt and advises he had a good reaction and prefers not to do another one going forward. ).  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  The following portions of the chart were reviewed this encounter and updated as appropriate:   Tobacco  Allergies  Meds  Problems  Med Hx  Surg Hx  Fam Hx      Review of Systems:  No other skin or systemic complaints except as noted in HPI or Assessment and Plan.  Objective  Well appearing patient in no apparent distress; mood and affect are within normal limits.  All skin waist up examined.  face Small yellow papules with a central dell.   Right Eyebrow x 1, right frontal scalp x 1 (2) Erythematous thin papules/macules with gritty scale.   upper mid chest 0.3cm slightly waxy macule with prominent telangiectasia without features suspicious for malignancy on dermoscopy       Assessment & Plan  Sebaceous hyperplasia face  Benign-appearing.  Observation.  Call clinic for new or changing lesions.    AK (actinic keratosis) (2) Right Eyebrow x 1, right frontal scalp x 1  Prior to procedure, discussed risks of blister formation, small wound, skin dyspigmentation, or rare scar following cryotherapy. Recommend Vaseline ointment to treated areas while healing.  Actinic keratoses are precancerous spots that appear secondary to cumulative UV radiation exposure/sun exposure over time. They are chronic with expected duration over 1 year. A portion of actinic keratoses will progress to squamous cell carcinoma of the skin. It is not possible to reliably predict which spots will progress to skin cancer and so treatment is recommended to prevent development of skin cancer.  Recommend daily broad spectrum sunscreen  SPF 30+ to sun-exposed areas, reapply every 2 hours as needed.  Recommend staying in the shade or wearing long sleeves, sun glasses (UVA+UVB protection) and wide brim hats (4-inch brim around the entire circumference of the hat). Call for new or changing lesions.   Destruction of lesion - Right Eyebrow x 1, right frontal scalp x 1  Destruction method: cryotherapy   Informed consent: discussed and consent obtained   Lesion destroyed using liquid nitrogen: Yes   Cryotherapy cycles:  2 Outcome: patient tolerated procedure well with no complications   Post-procedure details: wound care instructions given    Neoplasm of uncertain behavior of skin upper mid chest  Recheck on follow up, biopsy if indicated. Call for changes in the meantime.    Seborrheic Keratoses - Stuck-on, waxy, tan-brown papules and/or plaques  - Benign-appearing - Discussed benign etiology and prognosis. - Observe - Call for any changes  Hemangiomas - Red papules - Discussed benign nature - Observe - Call for any changes  Actinic Damage - chronic, secondary to cumulative UV radiation exposure/sun exposure over time - diffuse scaly erythematous macules with underlying dyspigmentation - Recommend daily broad spectrum sunscreen SPF 30+ to sun-exposed areas, reapply every 2 hours as needed.  - Recommend staying in the shade or wearing long sleeves, sun glasses (UVA+UVB protection) and wide brim hats (4-inch brim around the entire circumference of the hat). - Call for new or changing lesions.  History of PreCancerous Actinic Keratosis  - site(s) of PreCancerous Actinic Keratosis clear today. -  these may recur and new lesions may form requiring treatment to prevent transformation into skin cancer - observe for new or changing spots and contact [place] for appointment if occur - photoprotection with sun protective clothing; sunglasses and broad spectrum sunscreen with SPF of at least 30 + and frequent  self skin exams recommended - yearly exams by a dermatologist recommended for persons with history of PreCancerous Actinic Keratoses  History of Squamous Cell Carcinoma in Situ of the Skin - No evidence of recurrence today - Recommend regular full body skin exams - Recommend daily broad spectrum sunscreen SPF 30+ to sun-exposed areas, reapply every 2 hours as needed.  - Call if any new or changing lesions are noted between office visits. Return for TBSE, recheck upper mid chest.  Graciella Belton, RMA, am acting as scribe for Forest Gleason, MD .  Documentation: I have reviewed the above documentation for accuracy and completeness, and I agree with the above.  Forest Gleason, MD

## 2020-10-18 NOTE — Patient Instructions (Addendum)
Cryotherapy Aftercare  Wash gently with soap and water everyday.   Apply Vaseline and Band-Aid daily until healed.   Prior to procedure, discussed risks of blister formation, small wound, skin dyspigmentation, or rare scar following cryotherapy. Recommend Vaseline ointment to treated areas while healing.  Wound Care Instructions  Cleanse wound gently with soap and water once a day then pat dry with clean gauze. Apply a thing coat of Petrolatum (petroleum jelly, "Vaseline") over the wound (unless you have an allergy to this). We recommend that you use a new, sterile tube of Vaseline. Do not pick or remove scabs. Do not remove the yellow or white "healing tissue" from the base of the wound.  Cover the wound with fresh, clean, nonstick gauze and secure with paper tape. You may use Band-Aids in place of gauze and tape if the would is small enough, but would recommend trimming much of the tape off as there is often too much. Sometimes Band-Aids can irritate the skin.  You should call the office for your biopsy report after 1 week if you have not already been contacted.  If you experience any problems, such as abnormal amounts of bleeding, swelling, significant bruising, significant pain, or evidence of infection, please call the office immediately.  FOR ADULT SURGERY PATIENTS: If you need something for pain relief you may take 1 extra strength Tylenol (acetaminophen) AND 2 Ibuprofen ('200mg'$  each) together every 4 hours as needed for pain. (do not take these if you are allergic to them or if you have a reason you should not take them.) Typically, you may only need pain medication for 1 to 3 days.   Recommend daily broad spectrum sunscreen SPF 30+ to sun-exposed areas, reapply every 2 hours as needed. Call for new or changing lesions.  Staying in the shade or wearing long sleeves, sun glasses (UVA+UVB protection) and wide brim hats (4-inch brim around the entire circumference of the hat) are also  recommended for sun protection.   If you have any questions or concerns for your doctor, please call our main line at [phone] and press option 4 to reach your doctor's medical assistant. If no one answers, please leave a voicemail as directed and we will return your call as soon as possible. Messages left after 4 pm will be answered the following business day.   You may also send [place] a message via [place]. We typically respond to MyChart messages within 1-2 business days.  For prescription refills, please ask your pharmacy to contact our office. Our fax number is [phone].  If you have an urgent issue when the clinic is closed that cannot wait until the next business day, you can page your doctor at the number below.    Please note that while we do our best to be available for urgent issues outside of office hours, we are not available 24/7.   If you have an urgent issue and are unable to reach [place], you may choose to seek medical care at your doctor's office, retail clinic, urgent care center, or emergency room.  If you have a medical emergency, please immediately call 911 or go to the emergency department.  Pager Numbers  - Dr. Nehemiah Massed: [phone]  - Dr. Laurence Ferrari: [phone]  - Dr. Nicole Kindred: [phone]  In the event of inclement weather, please call our main line at [phone] for an update on the status of any delays or closures.  Dermatology Medication Tips: Please keep the boxes that topical medications come in in order  to help keep track of the instructions about where and how to use these. Pharmacies typically print the medication instructions only on the boxes and not directly on the medication tubes.   If your medication is too expensive, please contact our office at [phone] option 4 or send [place] a message through [place].   We are unable to tell what your co-pay for medications will be in advance as this is different depending on your insurance coverage. However,  we may be able to find a substitute medication at lower cost or fill out paperwork to get insurance to cover a needed medication.   If a prior authorization is required to get your medication covered by your insurance company, please allow [place] 1-2 business days to complete this process.  Drug prices often vary depending on where the prescription is filled and some pharmacies may offer cheaper prices.  The website www.goodrx.com contains coupons for medications through different pharmacies. The prices here do not account for what the cost may be with help from insurance (it may be cheaper with your insurance), but the website can give you the price if you did not use any insurance.  - You can print the associated coupon and take it with your prescription to the pharmacy.  - You may also stop by our office during regular business hours and pick up a GoodRx coupon card.  - If you need your prescription sent electronically to a different pharmacy, notify our office through [facility] or by phone at [phone] option 4.

## 2020-10-30 ENCOUNTER — Encounter: Payer: Self-pay | Admitting: Dermatology

## 2020-12-07 ENCOUNTER — Other Ambulatory Visit: Payer: Self-pay

## 2020-12-07 ENCOUNTER — Ambulatory Visit (INDEPENDENT_AMBULATORY_CARE_PROVIDER_SITE_OTHER): Payer: Medicare HMO | Admitting: Internal Medicine

## 2020-12-07 ENCOUNTER — Encounter: Payer: Self-pay | Admitting: Internal Medicine

## 2020-12-07 VITALS — BP 118/68 | HR 67 | Temp 98.6°F | Ht 69.0 in | Wt 200.0 lb

## 2020-12-07 DIAGNOSIS — C61 Malignant neoplasm of prostate: Secondary | ICD-10-CM

## 2020-12-07 DIAGNOSIS — Z0000 Encounter for general adult medical examination without abnormal findings: Secondary | ICD-10-CM

## 2020-12-07 DIAGNOSIS — N138 Other obstructive and reflux uropathy: Secondary | ICD-10-CM

## 2020-12-07 DIAGNOSIS — Z7189 Other specified counseling: Secondary | ICD-10-CM

## 2020-12-07 DIAGNOSIS — N401 Enlarged prostate with lower urinary tract symptoms: Secondary | ICD-10-CM | POA: Diagnosis not present

## 2020-12-07 DIAGNOSIS — K2270 Barrett's esophagus without dysplasia: Secondary | ICD-10-CM

## 2020-12-07 DIAGNOSIS — M25562 Pain in left knee: Secondary | ICD-10-CM | POA: Diagnosis not present

## 2020-12-07 LAB — COMPREHENSIVE METABOLIC PANEL
ALT: 13 U/L (ref 0–53)
AST: 18 U/L (ref 0–37)
Albumin: 4 g/dL (ref 3.5–5.2)
Alkaline Phosphatase: 54 U/L (ref 39–117)
BUN: 15 mg/dL (ref 6–23)
CO2: 33 mEq/L — ABNORMAL HIGH (ref 19–32)
Calcium: 8.9 mg/dL (ref 8.4–10.5)
Chloride: 101 mEq/L (ref 96–112)
Creatinine, Ser: 1.03 mg/dL (ref 0.40–1.50)
GFR: 68.4 mL/min (ref >60.00)
Glucose, Bld: 64 mg/dL — ABNORMAL LOW (ref 70–99)
Potassium: 4.3 mEq/L (ref 3.5–5.1)
Sodium: 140 mEq/L (ref 135–145)
Total Bilirubin: 0.6 mg/dL (ref 0.2–1.2)
Total Protein: 6.6 g/dL (ref 6.0–8.3)

## 2020-12-07 LAB — CBC
HCT: 40.1 % (ref 39.0–52.0)
Hemoglobin: 13.3 g/dL (ref 13.0–17.0)
MCHC: 33.3 g/dL (ref 30.0–36.0)
MCV: 92.7 fl (ref 78.0–100.0)
Platelets: 191 10*3/uL (ref 150.0–400.0)
RBC: 4.32 Mil/uL (ref 4.22–5.81)
RDW: 13.6 % (ref 11.5–15.5)
WBC: 4.8 10*3/uL (ref 4.0–10.5)

## 2020-12-07 LAB — PSA: PSA: 0.61 ng/mL (ref 0.10–4.00)

## 2020-12-07 NOTE — Assessment & Plan Note (Signed)
Quiet on the omeprazole 

## 2020-12-07 NOTE — Assessment & Plan Note (Signed)
Voids okay on the tamsulosin

## 2020-12-07 NOTE — Progress Notes (Signed)
Subjective:    Patient ID: Christian Carlson, male    DOB: 10-09-1940, 80 y.o.   MRN: [id number]  HPI Here for Medicare wellness visit and follow up of chronic health conditions This visit occurred during the SARS-CoV-2 public health emergency.  Safety protocols were in place, including screening questions prior to the visit, additional usage of staff PPE, and extensive cleaning of exam room while observing appropriate contact time as indicated for disinfecting solutions.   Reviewed advanced directives Reviewed other doctors---Dr Chrystal--radiation oncology, Dr Matthew Folks, Dr Mitchum--dentist No hospitalizations or surgery this year Vision is fine Hearing aides do help No falls No depression or anhedonia Independent with instrumental ADLs No sig memory problem  Has had some arthritic problems with left knee Did see Dr Lorelei Pont for this Feels better some Walks--but not as much Uses OTC cream for this  Did have RT for prostate cancer Done slightly more than a year ago No recent lupron injection  Voids okay with flomax Notices if he misses it Nocturia x 1-3 generally (worse if he has coffee at night)  Takes prilosec daily No heartburn or dysphagia while on this  Current Outpatient Medications on File Prior to Visit  Medication Sig Dispense Refill   aspirin 81 MG chewable tablet Chew 81 mg by mouth daily.     ciclopirox (LOPROX) 0.77 % cream APPLY AS DIRECTED APPLY TO FEET AND IN BETWEEN TOES TWICE DAILY 90 g 1   hydrocortisone 2.5 % cream      magnesium oxide (MAG-OX) 400 MG tablet Take 400 mg by mouth daily.     Multiple Vitamin (MULTI-VITAMINS) TABS Take 1 tablet by mouth daily.     omeprazole (PRILOSEC) 20 MG capsule Take 20 mg by mouth daily.     tamsulosin (FLOMAX) 0.4 MG CAPS capsule TAKE 1 CAPSULE(0.4 MG) BY MOUTH DAILY AFTER SUPPER 30 capsule 11   Zinc 50 MG CAPS      No current facility-administered medications on file prior to visit.    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    Past Surgical History:  Procedure Laterality Date   CATARACT EXTRACTION, BILATERAL  2015   ESOPHAGOGASTRODUODENOSCOPY (EGD) WITH PROPOFOL N/A 10/09/2017   Surgeon: Jonathon Bellows, MD; Location: [facility] ENDOSCOPY; REPEAT 3 YEARS 10/2020 Barrett's esophagus   TONSILLECTOMY AND ADENOIDECTOMY      Family History  Problem Relation Age of Onset   Stroke Father    Diabetes Father    Hypertension Sister    Stroke Brother    Esophageal cancer Brother    Heart disease Neg Hx     Social History   Socioeconomic History   Marital status: Married    Spouse name: Not on file   Number of children: 2   Years of education: Not on file   Highest education level: Not on file  Occupational History   Occupation: Primary school teacher    Comment: Retired  Tobacco Use   Smoking status: Former   Smokeless tobacco: Never  Substance and Sexual  Activity   Alcohol use: No    Comment: quit 2 years ago with wife's breast cancer   Drug use: Not on file   Sexual activity: Not on file  Other Topics Concern   Not on file  Social History Narrative   Has living will   Wife, then daughter, should make health care decisions   Would accept resuscitation but no prolonged ventilation   No tube feeds if cognitively unaware   Social Determinants of Health   Financial Resource Strain: Not on file  Food Insecurity: Not on file  Transportation Needs: Not on file  Physical Activity: Not on file  Stress: Not on file  Social Connections: Not on file  Intimate Partner Violence: Not on file    Review of Systems Appetite is good Weight stable Sleeps well Wears seat belt Bowels are fine---some blood on toilet paper from hemorrhoid (long standing) No chest pain or SOB No dizziness or syncope Some pain in left thumb also--no other joint issues     Objective:   Physical Exam Constitutional:      Appearance: Normal appearance.  HENT:     Mouth/Throat:     Comments: No lesions Eyes:     Conjunctiva/sclera: Conjunctivae normal.     Pupils: Pupils are equal, round, and reactive to light.  Cardiovascular:     Rate and Rhythm: Normal rate and regular rhythm.     Pulses: Normal pulses.     Heart sounds: No murmur heard.   No gallop.  Pulmonary:     Effort: Pulmonary effort is normal.     Breath sounds: Normal breath sounds. No wheezing or rales.  Abdominal:     Palpations: Abdomen is soft.     Tenderness: There is no abdominal tenderness.  Musculoskeletal:     Cervical back: Neck supple.     Right lower leg: No edema.     Left lower leg: No edema.  Lymphadenopathy:     Cervical: No cervical adenopathy.  Skin:    Findings: No lesion or rash.  Neurological:     Mental Status: He is alert and oriented to person, place, and time.     Comments: President---"Biden, Obama----Trump" [id number] Recall 3/3  Psychiatric:        Mood and Affect: Mood normal.        Behavior: Behavior normal.           Assessment & Plan:

## 2020-12-07 NOTE — Assessment & Plan Note (Signed)
Better now Using OTC cream on this

## 2020-12-07 NOTE — Assessment & Plan Note (Signed)
See social history 

## 2020-12-07 NOTE — Assessment & Plan Note (Signed)
I have personally reviewed the Medicare Annual Wellness questionnaire and have noted 1. The patient's medical and social history 2. Their use of alcohol, tobacco or illicit drugs 3. Their current medications and supplements 4. The patient's functional ability including ADL's, fall risks, home safety risks and hearing or visual             impairment. 5. Diet and physical activities 6. Evidence for depression or mood disorders  The patients weight, height, BMI and visual acuity have been recorded in the chart I have made referrals, counseling and provided education to the patient based review of the above and I have provided the pt with a written personalized care plan for preventive services.  I have provided you with a copy of your personalized plan for preventive services. Please take the time to review along with your updated medication list.  Just had bivalent COVID Flu vaccine later this fall Done with colonoscopies Discussed exercise and resistance training Td if any injury

## 2020-12-07 NOTE — Assessment & Plan Note (Signed)
Will reluctantly do lupron again if PSA rising

## 2021-01-15 ENCOUNTER — Other Ambulatory Visit: Payer: Self-pay

## 2021-01-15 ENCOUNTER — Encounter: Payer: Self-pay | Admitting: Nurse Practitioner

## 2021-01-15 ENCOUNTER — Other Ambulatory Visit: Payer: Self-pay | Admitting: Nurse Practitioner

## 2021-01-15 ENCOUNTER — Ambulatory Visit (INDEPENDENT_AMBULATORY_CARE_PROVIDER_SITE_OTHER): Payer: Medicare HMO | Admitting: Nurse Practitioner

## 2021-01-15 ENCOUNTER — Ambulatory Visit (INDEPENDENT_AMBULATORY_CARE_PROVIDER_SITE_OTHER)
Admission: RE | Admit: 2021-01-15 | Discharge: 2021-01-15 | Disposition: A | Discharge status: Home / Self Care | Payer: Medicare HMO | Source: Ambulatory Visit | Attending: Nurse Practitioner | Admitting: Nurse Practitioner

## 2021-01-15 VITALS — BP 112/76 | HR 94 | Temp 98.4°F | Resp 18 | Ht 69.0 in | Wt 210.0 lb

## 2021-01-15 DIAGNOSIS — R051 Acute cough: Secondary | ICD-10-CM

## 2021-01-15 DIAGNOSIS — R0982 Postnasal drip: Secondary | ICD-10-CM | POA: Diagnosis not present

## 2021-01-15 MED ORDER — FLUTICASONE PROPIONATE 50 MCG/ACT NA SUSP: 2.0000 | Freq: Every day | NASAL | 0 refills | Status: DC

## 2021-01-15 NOTE — Patient Instructions (Signed)
Nice to see you today Will be in touch with the results of the chest xray. Let me know if your symptoms worsen or fail to improve

## 2021-01-15 NOTE — Progress Notes (Signed)
Acute Office Visit  Subjective:    Patient ID: Christian Carlson, male    DOB: 11-03-1940, 80 y.o.   MRN: [id number]  Chief Complaint  Patient presents with   Cough    X 2 weeks, head congestion, nasal congestion, cough-productive mainly white phlegm, post nasal drip, slight sore throat. Covid test negative on 10/29 or 01/13/21. No fever.    Cough Associated symptoms include postnasal drip. Pertinent negatives include no chest pain, chills, fever, headaches, myalgias, sore throat or shortness of breath.  Patient is in today for Cough  Started on 12/30/2020. Went on a trip the next day to [place] country.   Took at home covid test this weekend and it was negative  Symptoms got worse towards the end of the first week. Got home 01/06/2021 Has gotten better since he got home Dyquill and nyquill with saline spray.  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    Past Surgical History:  Procedure Laterality Date   CATARACT EXTRACTION, BILATERAL  2015   ESOPHAGOGASTRODUODENOSCOPY (EGD) WITH PROPOFOL N/A 10/09/2017   Surgeon: Jonathon Bellows, MD; Location: [facility] ENDOSCOPY; REPEAT 3 YEARS 10/2020 Barrett's esophagus   TONSILLECTOMY AND ADENOIDECTOMY      Family History  Problem Relation Age of Onset   Stroke Father    Diabetes Father    Hypertension Sister    Stroke Brother    Esophageal cancer Brother    Heart disease Neg Hx     Social History   Socioeconomic History   Marital status: Married    Spouse name: Not on file   Number of children: 2   Years of education: Not on file   Highest education level: Not  on file  Occupational History   Occupation: Primary school teacher    Comment: Retired  Tobacco Use   Smoking status: Former   Smokeless tobacco: Never  Substance and Sexual Activity   Alcohol use: No    Comment: quit 2 years ago with wife's breast cancer   Drug use: Not on file   Sexual activity: Not on file  Other Topics Concern   Not on file  Social History Narrative   Has living will   Wife, then daughter, should make health care decisions   Would accept resuscitation but no prolonged ventilation   No tube feeds if cognitively unaware   Social Determinants of Health   Financial Resource Strain: Not on file  Food Insecurity: Not on file  Transportation Needs: Not on file  Physical Activity: Not on file  Stress: Not on file  Social Connections: Not on file  Intimate Partner Violence: Not on file    Outpatient Medications Prior to Visit  Medication Sig Dispense Refill   aspirin 81 MG chewable tablet Chew 81 mg by mouth daily.     ciclopirox (LOPROX) 0.77 % cream APPLY AS DIRECTED APPLY TO FEET AND IN BETWEEN TOES TWICE DAILY 90 g 1   magnesium oxide (MAG-OX) 400 MG tablet Take 400 mg by mouth daily.     Multiple Vitamin (  MULTI-VITAMINS) TABS Take 1 tablet by mouth daily.     omeprazole (PRILOSEC) 20 MG capsule Take 20 mg by mouth daily.     tamsulosin (FLOMAX) 0.4 MG CAPS capsule TAKE 1 CAPSULE(0.4 MG) BY MOUTH DAILY AFTER SUPPER 30 capsule 11   Zinc 50 MG CAPS      hydrocortisone 2.5 % cream      No facility-administered medications prior to visit.    No Known Allergies  Review of Systems  Constitutional:  Positive for fatigue. Negative for chills and fever.  HENT:  Positive for congestion and postnasal drip. Negative for sore throat.   Respiratory:  Positive for cough (Thin white phelgm). Negative for shortness of breath.   Cardiovascular:  Negative for chest pain.  Gastrointestinal:  Negative for diarrhea, nausea and vomiting.  Musculoskeletal:  Negative for  arthralgias and myalgias.  Neurological:  Negative for dizziness, light-headedness and headaches.      Objective:    Physical Exam Vitals and nursing note reviewed.  Constitutional:      Appearance: Normal appearance.  HENT:     Right Ear: Tympanic membrane, ear canal and external ear normal. There is no impacted cerumen.     Left Ear: Tympanic membrane, ear canal and external ear normal. There is no impacted cerumen.     Ears:     Comments: Wears hearing aids    Nose:     Right Sinus: No maxillary sinus tenderness or frontal sinus tenderness.     Left Sinus: No maxillary sinus tenderness or frontal sinus tenderness.     Mouth/Throat:     Mouth: Mucous membranes are moist.     Pharynx: Oropharynx is clear.     Comments: cobblestoning Eyes:     Extraocular Movements: Extraocular movements intact.     Pupils: Pupils are equal, round, and reactive to light.  Neck:     Thyroid: No thyroid mass, thyromegaly or thyroid tenderness.  Cardiovascular:     Rate and Rhythm: Normal rate and regular rhythm.  Pulmonary:     Effort: Pulmonary effort is normal.     Breath sounds: Normal breath sounds.  Abdominal:     General: Bowel sounds are normal.  Lymphadenopathy:     Cervical: No cervical adenopathy.  Neurological:     Mental Status: He is alert.  Psychiatric:        Mood and Affect: Mood normal.        Behavior: Behavior normal.        Thought Content: Thought content normal.        Judgment: Judgment normal.    BP 112/76   Pulse (!) 107   Temp 98.4 F (36.9 C)   Resp 18   Ht 5\' 9"  (1.753 m)   Wt 210 lb (95.3 kg)   SpO2 92%   BMI 31.01 kg/m  Wt Readings from Last 3 Encounters:  01/15/21 210 lb (95.3 kg)  12/07/20 200 lb (90.7 kg)  09/24/20 203 lb 8 oz (92.3 kg)    Health Maintenance Due  Topic Date Due   TETANUS/TDAP  11/12/2020    There are no preventive care reminders to display for this patient.   No results found for: TSH Lab Results  Component Value  Date   WBC 4.8 12/07/2020   HGB 13.3 12/07/2020   HCT 40.1 12/07/2020   MCV 92.7 12/07/2020   PLT 191.0 12/07/2020   Lab Results  Component Value Date   NA 140 12/07/2020   K 4.3 12/07/2020  CO2 33 (H) 12/07/2020   GLUCOSE 64 (L) 12/07/2020   BUN 15 12/07/2020   CREATININE 1.03 12/07/2020   BILITOT 0.6 12/07/2020   ALKPHOS 54 12/07/2020   AST 18 12/07/2020   ALT 13 12/07/2020   PROT 6.6 12/07/2020   ALBUMIN 4.0 12/07/2020   CALCIUM 8.9 12/07/2020   GFR 68.40 12/07/2020   No results found for: CHOL No results found for: HDL No results found for: LDLCALC No results found for: TRIG No results found for: CHOLHDL No results found for: HGBA1C     Assessment & Plan:   Problem List Items Addressed This Visit       Other   Acute cough    Patient having a cough extending for 2 weeks.  Wife started having symptoms several days after him went to urgent care and was diagnosed with acute pneumonia.  Patient is concerned for this.  His symptoms have improved inclusive of cough slightly.  Will obtain chest x-ray to be certain given patient's age.  No history of pneumonia or DVT PE.      Relevant Orders   DG Chest 2 View (Completed)   PND (post-nasal drip) - Primary    Patient symptoms of postnasal drip.  He has been taking oral over-the-counter medications.  Send in some Flonase for patient to see if this helps with symptom management.  Continue to monitor      Relevant Medications   fluticasone (FLONASE) 50 MCG/ACT nasal spray     No orders of the defined types were placed in this encounter.  This visit occurred during the SARS-CoV-2 public health emergency.  Safety protocols were in place, including screening questions prior to the visit, additional usage of staff PPE, and extensive cleaning of exam room while observing appropriate contact time as indicated for disinfecting solutions.   Romilda Garret, NP

## 2021-01-15 NOTE — Assessment & Plan Note (Signed)
Patient symptoms of postnasal drip.  He has been taking oral over-the-counter medications.  Send in some Flonase for patient to see if this helps with symptom management.  Continue to monitor

## 2021-01-15 NOTE — Assessment & Plan Note (Signed)
Patient having a cough extending for 2 weeks.  Wife started having symptoms several days after him went to urgent care and was diagnosed with acute pneumonia.  Patient is concerned for this.  His symptoms have improved inclusive of cough slightly.  Will obtain chest x-ray to be certain given patient's age.  No history of pneumonia or DVT PE.

## 2021-02-08 ENCOUNTER — Other Ambulatory Visit: Payer: Medicare HMO

## 2021-02-11 ENCOUNTER — Other Ambulatory Visit: Payer: Self-pay | Admitting: *Deleted

## 2021-02-11 DIAGNOSIS — C61 Malignant neoplasm of prostate: Secondary | ICD-10-CM

## 2021-02-12 ENCOUNTER — Other Ambulatory Visit: Payer: Medicare HMO

## 2021-02-12 ENCOUNTER — Inpatient Hospital Stay: Payer: Medicare HMO | Attending: Radiation Oncology

## 2021-02-12 ENCOUNTER — Other Ambulatory Visit: Payer: Self-pay

## 2021-02-12 DIAGNOSIS — C61 Malignant neoplasm of prostate: Secondary | ICD-10-CM | POA: Insufficient documentation

## 2021-02-12 LAB — PSA: Prostatic Specific Antigen: 0.55 ng/mL (ref 0.00–4.00)

## 2021-02-14 ENCOUNTER — Ambulatory Visit
Admission: RE | Admit: 2021-02-14 | Discharge: 2021-02-14 | Disposition: A | Discharge status: Home / Self Care | Payer: Medicare HMO | Source: Ambulatory Visit | Attending: Radiation Oncology | Admitting: Radiation Oncology

## 2021-02-14 ENCOUNTER — Other Ambulatory Visit: Payer: Self-pay

## 2021-02-14 ENCOUNTER — Encounter: Payer: Self-pay | Admitting: Radiation Oncology

## 2021-02-14 VITALS — BP 127/83 | Temp 97.6°F | Wt 204.0 lb

## 2021-02-14 DIAGNOSIS — C61 Malignant neoplasm of prostate: Secondary | ICD-10-CM | POA: Insufficient documentation

## 2021-02-14 DIAGNOSIS — Z923 Personal history of irradiation: Secondary | ICD-10-CM | POA: Insufficient documentation

## 2021-02-14 NOTE — Progress Notes (Signed)
Radiation Oncology Follow up Note  Name: Christian Carlson   Date:   02/14/2021 MRN:  [id number] DOB: 09-11-1940    This 80 y.o. 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  REFERRING PROVIDER: Venia Carbon, MD  HPI: Patient is an 80 year old male now out 2 years having completed image guided IMRT radiation therapy to his prostate for Gleason 7 adenocarcinoma presenting with a PSA of 11.5.  Seen today in routine follow-up from a clinical standpoint he is doing well.  He specifically denies any increased lower urinary tract symptoms diarrhea..  His most recent PSA has creeped up slightly to 0.55 from 1.12 a year ago.  COMPLICATIONS OF TREATMENT: none  FOLLOW UP COMPLIANCE: keeps appointments   PHYSICAL EXAM:  BP 127/83   Temp 97.6 F (36.4 C) (Tympanic)   Wt 204 lb (92.5 kg)   BMI 30.13 kg/m  Well-developed well-nourished patient in NAD. HEENT reveals PERLA, EOMI, discs not visualized.  Oral cavity is clear. No oral mucosal lesions are identified. Neck is clear without evidence of cervical or supraclavicular adenopathy. Lungs are clear to A&P. Cardiac examination is essentially unremarkable with regular rate and rhythm without murmur rub or thrill. Abdomen is benign with no organomegaly or masses noted. Motor sensory and DTR levels are equal and symmetric in the upper and lower extremities. Cranial nerves II through XII are grossly intact. Proprioception is intact. No peripheral adenopathy or edema is identified. No motor or sensory levels are noted. Crude visual fields are within normal range.  RADIOLOGY RESULTS: No current films to review  PLAN: Present time patient is clinically doing well.  I explained to him there is been a slight checkup in his PSA to like to keep a closer eye on it with a repeat in 6 months should there be a continual trend  upward.  Would refer him for ADT therapy and possible evaluation by medical oncology.  Patient comprehends my recommendations well.  I would like to take this opportunity to thank you for allowing me to participate in the care of your patient.Noreene Filbert, MD

## 2021-02-14 NOTE — Progress Notes (Signed)
Done

## 2021-04-25 ENCOUNTER — Encounter: Payer: Self-pay | Admitting: Dermatology

## 2021-04-25 ENCOUNTER — Ambulatory Visit: Payer: Medicare HMO | Admitting: Dermatology

## 2021-04-25 ENCOUNTER — Other Ambulatory Visit: Payer: Self-pay

## 2021-04-25 DIAGNOSIS — L814 Other melanin hyperpigmentation: Secondary | ICD-10-CM

## 2021-04-25 DIAGNOSIS — L570 Actinic keratosis: Secondary | ICD-10-CM

## 2021-04-25 DIAGNOSIS — L578 Other skin changes due to chronic exposure to nonionizing radiation: Secondary | ICD-10-CM

## 2021-04-25 DIAGNOSIS — Z1283 Encounter for screening for malignant neoplasm of skin: Secondary | ICD-10-CM

## 2021-04-25 DIAGNOSIS — B009 Herpesviral infection, unspecified: Secondary | ICD-10-CM | POA: Diagnosis not present

## 2021-04-25 DIAGNOSIS — D1800 Hemangioma unspecified site: Secondary | ICD-10-CM

## 2021-04-25 DIAGNOSIS — L821 Other seborrheic keratosis: Secondary | ICD-10-CM

## 2021-04-25 DIAGNOSIS — D229 Melanocytic nevi, unspecified: Secondary | ICD-10-CM

## 2021-04-25 MED ORDER — VALACYCLOVIR HCL 1 G PO TABS: ORAL_TABLET | ORAL | 1 refills | Status: AC

## 2021-04-25 NOTE — Progress Notes (Signed)
Follow-Up Visit   Subjective  Christian Carlson is a 81 y.o. male who presents for the following: Annual Exam (Here for skin cancer screening. Full body. HxSCC. Recheck lesion on chest, no changes per patient).  The patient presents for Total-Body Skin Exam (TBSE) for skin cancer screening and mole check.  The patient has spots, moles and lesions to be evaluated, some may be new or changing and the patient has concerns that these could be cancer.   The following portions of the chart were reviewed this encounter and updated as appropriate:  Tobacco   Allergies   Meds   Problems   Med Hx   Surg Hx   Fam Hx       Review of Systems: No other skin or systemic complaints except as noted in HPI or Assessment and Plan.   Objective  Well appearing patient in no apparent distress; mood and affect are within normal limits.  A full examination was performed including scalp, head, eyes, ears, nose, lips, neck, chest, axillae, abdomen, back, buttocks, bilateral upper extremities, bilateral lower extremities, hands, feet, fingers, toes, fingernails, and toenails. All findings within normal limits unless otherwise noted below.  right forehead, frontal scalp Erythematous thin papules/macules with gritty scale.   Right Lower Vermilion Lip Erythematous vesicular eruption   Assessment & Plan   Lentigines - Scattered tan macules - Due to sun exposure - Benign-appearing, observe - Recommend daily broad spectrum sunscreen SPF 30+ to sun-exposed areas, reapply every 2 hours as needed. - Call for any changes  Seborrheic Keratoses - Stuck-on, waxy, tan-brown papules and/or plaques  - Benign-appearing - Discussed benign etiology and prognosis. - Observe - Call for any changes  Melanocytic Nevi - Tan-brown and/or pink-flesh-colored symmetric macules and papules - Benign appearing on exam today - Observation - Call clinic for new or changing moles - Recommend daily use of broad spectrum spf 30+  sunscreen to sun-exposed areas.   Hemangiomas - Red papules - Discussed benign nature - Observe - Call for any changes  Actinic Damage - Chronic condition, secondary to cumulative UV/sun exposure - diffuse scaly erythematous macules with underlying dyspigmentation - Recommend daily broad spectrum sunscreen SPF 30+ to sun-exposed areas, reapply every 2 hours as needed.  - Staying in the shade or wearing long sleeves, sun glasses (UVA+UVB protection) and wide brim hats (4-inch brim around the entire circumference of the hat) are also recommended for sun protection.  - Call for new or changing lesions.  History of Squamous Cell Carcinoma of the Skin - No evidence of recurrence today at left lower back - No lymphadenopathy - Recommend regular full body skin exams - Recommend daily broad spectrum sunscreen SPF 30+ to sun-exposed areas, reapply every 2 hours as needed.  - Call if any new or changing lesions are noted between office visits    Skin cancer screening performed today.  AK (actinic keratosis) right forehead, frontal scalp  Actinic keratoses are precancerous spots that appear secondary to cumulative UV radiation exposure/sun exposure over time. They are chronic with expected duration over 1 year. A portion of actinic keratoses will progress to squamous cell carcinoma of the skin. It is not possible to reliably predict which spots will progress to skin cancer and so treatment is recommended to prevent development of skin cancer.  Recommend daily broad spectrum sunscreen SPF 30+ to sun-exposed areas, reapply every 2 hours as needed.  Recommend staying in the shade or wearing long sleeves, sun glasses (UVA+UVB protection) and wide brim  hats (4-inch brim around the entire circumference of the hat). Call for new or changing lesions.  Very early/mild, watch for now. Call for any worsening. Recommend strict DAILY sun protection.  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 Condition is bothersome/symptomatic for patient. Currently flared.  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  It is infectious and can be spread from person to person by direct contact.  It is not curable, but is treatable with topical and oral medication.  Start valacyclovir take 2 tablets with a glass of water at first sign of symptoms, repeat in 12 hours for a ONE day treatment  valACYclovir (VALTREX) 1000 MG tablet - Right Lower Vermilion Lip Take 2 tablets at first sign of symptoms, repeat in 12 hours for a 1 day dose. Stop Treatment.  Repeat as needed for new cold sores   Return in about 1 year (around 04/25/2022) for TBSE.  I, Emelia Salisbury, CMA, am acting as scribe for Forest Gleason, MD.  Documentation: I have reviewed the above documentation for accuracy and completeness, and I agree with the above.  Forest Gleason, MD

## 2021-04-25 NOTE — Patient Instructions (Addendum)
Cryotherapy Aftercare  Wash gently with soap and water everyday.   Apply Vaseline and Band-Aid daily until healed.   Prior to procedure, discussed risks of blister formation, small wound, skin dyspigmentation, or rare scar following cryotherapy. Recommend Vaseline ointment to treated areas while healing.   Recommend taking Heliocare sun protection supplement daily in sunny weather for additional sun protection. For maximum protection on the sunniest days, you can take up to 2 capsules of regular Heliocare OR take 1 capsule of Heliocare Ultra. For prolonged exposure (such as a full day in the sun), you can repeat your dose of the supplement 4 hours after your first dose. Heliocare can be purchased at [employer], at some [facility] or at [email or web address].    Valacyclovir take 2 tablets at first sign of symptoms, repeat in 12 hours for a ONE day dose. Repeat as needed for new cold sores.  Melanoma ABCDEs  Melanoma is the most dangerous type of skin cancer, and is the leading cause of death from skin disease.  You are more likely to develop melanoma if you: Have light-colored skin, light-colored eyes, or red or blond hair Spend a lot of time in the sun Tan regularly, either outdoors or in a tanning bed Have had blistering sunburns, especially during childhood Have a close family member who has had a melanoma Have atypical moles or large birthmarks  Early detection of melanoma is key since treatment is typically straightforward and cure rates are extremely high if we catch it early.   The first sign of melanoma is often a change in a mole or a new dark spot.  The ABCDE system is a way of remembering the signs of melanoma.  A for asymmetry:  The two halves do not match. B for border:  The edges of the growth are irregular. C for color:  A mixture of colors are present instead of an even brown color. D for diameter:  Melanomas are usually (but not always) greater than 40mm - the size of  a pencil eraser. E for evolution:  The spot keeps changing in size, shape, and color.  Please check your skin once per month between visits. You can use a small mirror in front and a large mirror behind you to keep an eye on the back side or your body.   If you see any new or changing lesions before your next follow-up, please call to schedule a visit.  Please continue daily skin protection including broad spectrum sunscreen SPF 30+ to sun-exposed areas, reapplying every 2 hours as needed when you're outdoors.   Staying in the shade or wearing long sleeves, sun glasses (UVA+UVB protection) and wide brim hats (4-inch brim around the entire circumference of the hat) are also recommended for sun protection.    If You Need Anything After Your Visit  If you have any questions or concerns for your doctor, please call our main line at [phone] and press option 4 to reach your doctor's medical assistant. If no one answers, please leave a voicemail as directed and we will return your call as soon as possible. Messages left after 4 pm will be answered the following business day.   You may also send [place] a message via [place]. We typically respond to MyChart messages within 1-2 business days.  For prescription refills, please ask your pharmacy to contact our office. Our fax number is [phone].  If you have an urgent issue when the clinic is closed that cannot wait until the  next business day, you can page your doctor at the number below.    Please note that while we do our best to be available for urgent issues outside of office hours, we are not available 24/7.   If you have an urgent issue and are unable to reach [place], you may choose to seek medical care at your doctor's office, retail clinic, urgent care center, or emergency room.  If you have a medical emergency, please immediately call 911 or go to the emergency department.  Pager Numbers  - Dr. Nehemiah Massed: [phone]  - Dr. Laurence Ferrari:  [phone]  - Dr. Nicole Kindred: [phone]  In the event of inclement weather, please call our main line at [phone] for an update on the status of any delays or closures.  Dermatology Medication Tips: Please keep the boxes that topical medications come in in order to help keep track of the instructions about where and how to use these. Pharmacies typically print the medication instructions only on the boxes and not directly on the medication tubes.   If your medication is too expensive, please contact our office at [phone] option 4 or send [place] a message through [place].   We are unable to tell what your co-pay for medications will be in advance as this is different depending on your insurance coverage. However, we may be able to find a substitute medication at lower cost or fill out paperwork to get insurance to cover a needed medication.   If a prior authorization is required to get your medication covered by your insurance company, please allow [place] 1-2 business days to complete this process.  Drug prices often vary depending on where the prescription is filled and some pharmacies may offer cheaper prices.  The website www.goodrx.com contains coupons for medications through different pharmacies. The prices here do not account for what the cost may be with help from insurance (it may be cheaper with your insurance), but the website can give you the price if you did not use any insurance.  - You can print the associated coupon and take it with your prescription to the pharmacy.  - You may also stop by our office during regular business hours and pick up a GoodRx coupon card.  - If you need your prescription sent electronically to a different pharmacy, notify our office through [facility] or by phone at [phone] option 4.     Si Usted Necesita Algo Despus de Su Visita  Tambin puede enviarnos un mensaje a travs de Pharmacist, community. Por lo general respondemos a los mensajes de  MyChart en el transcurso de 1 a 2 das hbiles.  Para renovar recetas, por favor pida a su farmacia que se ponga en contacto con nuestra oficina. Harland Dingwall de fax es [place] [phone].  Si tiene un asunto urgente cuando la clnica est cerrada y que no puede esperar hasta el siguiente da hbil, puede llamar/localizar a su doctor(a) al nmero que aparece a continuacin.   Por favor, tenga en cuenta que aunque hacemos todo lo posible para estar disponibles para asuntos urgentes fuera del horario de [place], no estamos disponibles las 24 horas del da, los 7 das de la [place].   Si tiene un problema urgente y no puede comunicarse con nosotros, puede optar por buscar atencin mdica  en el consultorio de su doctor(a), en una clnica privada, en un centro de atencin urgente o en una sala de emergencias.  Si tiene una emergencia mdica, por favor llame inmediatamente al 911  o vaya a la sala de emergencias.  Nmeros de bper  - Dr. Nehemiah Massed: [phone]  - Dra. Moye: [phone]  - Dra. Nicole Kindred: [phone]  En caso de inclemencias del [place], por favor llame a Johnsie Kindred principal al [phone] para una actualizacin sobre el [place] de cualquier retraso o cierre.  Consejos para la medicacin en dermatologa: Por favor, guarde las cajas en las que vienen los medicamentos de uso tpico para ayudarle a seguir las instrucciones sobre dnde y cmo usarlos. Las farmacias generalmente imprimen las instrucciones del medicamento slo en las cajas y no directamente en los tubos del [place].   Si su medicamento es muy caro, por favor, pngase en contacto con Zigmund Daniel llamando al [phone] y presione la opcin 4 o envenos un mensaje a travs de Pharmacist, community.   No podemos decirle cul ser su copago por los medicamentos por adelantado ya que esto es diferente dependiendo de la cobertura de su seguro. Sin embargo, es posible que podamos encontrar un medicamento sustituto a Contractor un formulario para que el seguro cubra el medicamento que se considera necesario.   Si se requiere una autorizacin previa para que su compaa de seguros [place] su medicamento, por favor permtanos de 1 a 2 das hbiles para completar este proceso.  Los precios de los medicamentos varan con frecuencia dependiendo del Environmental consultant de dnde se surte la receta y alguna farmacias pueden ofrecer precios ms baratos.  El sitio web www.goodrx.com tiene cupones para medicamentos de Airline pilot. Los precios aqu no tienen en cuenta lo que podra costar con la ayuda del seguro (puede ser ms barato con su seguro), pero el sitio web puede darle el precio si no utiliz Research scientist (physical sciences).  - Puede imprimir el cupn correspondiente y llevarlo con su receta a la farmacia.  - Tambin puede pasar por nuestra oficina durante el horario de atencin regular y Charity fundraiser una tarjeta de cupones de GoodRx.  - Si necesita que su receta se enve electrnicamente a una farmacia diferente, informe a nuestra oficina a travs de MyChart de [place] o por telfono llamando al [phone] y presione la opcin 4.

## 2021-05-04 ENCOUNTER — Encounter: Payer: Self-pay | Admitting: Dermatology

## 2021-07-09 ENCOUNTER — Ambulatory Visit (INDEPENDENT_AMBULATORY_CARE_PROVIDER_SITE_OTHER): Payer: Medicare HMO | Admitting: Internal Medicine

## 2021-07-09 ENCOUNTER — Encounter: Payer: Self-pay | Admitting: Internal Medicine

## 2021-07-09 VITALS — BP 132/72 | HR 78 | Temp 97.2°F | Ht 69.0 in | Wt 203.0 lb

## 2021-07-09 DIAGNOSIS — R002 Palpitations: Secondary | ICD-10-CM

## 2021-07-09 LAB — COMPREHENSIVE METABOLIC PANEL
ALT: 18 U/L (ref 0–53)
AST: 19 U/L (ref 0–37)
Albumin: 4 g/dL (ref 3.5–5.2)
Alkaline Phosphatase: 49 U/L (ref 39–117)
BUN: 13 mg/dL (ref 6–23)
CO2: 30 mEq/L (ref 19–32)
Calcium: 8.7 mg/dL (ref 8.4–10.5)
Chloride: 103 mEq/L (ref 96–112)
Creatinine, Ser: 0.98 mg/dL (ref 0.40–1.50)
GFR: 72.31 mL/min (ref >60.00)
Glucose, Bld: 73 mg/dL (ref 70–99)
Potassium: 4.2 mEq/L (ref 3.5–5.1)
Sodium: 139 mEq/L (ref 135–145)
Total Bilirubin: 0.6 mg/dL (ref 0.2–1.2)
Total Protein: 6.4 g/dL (ref 6.0–8.3)

## 2021-07-09 LAB — CBC
HCT: 39.7 % (ref 39.0–52.0)
Hemoglobin: 13.4 g/dL (ref 13.0–17.0)
MCHC: 33.7 g/dL (ref 30.0–36.0)
MCV: 92.3 fl (ref 78.0–100.0)
Platelets: 193 10*3/uL (ref 150.0–400.0)
RBC: 4.3 Mil/uL (ref 4.22–5.81)
RDW: 13.9 % (ref 11.5–15.5)
WBC: 4.4 10*3/uL (ref 4.0–10.5)

## 2021-07-09 LAB — T4, FREE: Free T4: 0.75 ng/dL (ref 0.60–1.60)

## 2021-07-09 NOTE — Progress Notes (Signed)
? ?Subjective:  ? ? Patient ID: Christian Carlson, male    DOB: [birth year], 81 y.o.   MRN: [id number] ? ?HPI ?Here due to heart fluttering ? ?Has occasional sense that his heart is beating fast ?May last as long as 20 seconds ?He will check at his carotid---and very fast (150??) ?Not sure if regular ?No chest pain or SOB ? ?Often early in the morning--but not always ?Once a week or so ? ?Does have caffeinated tea at most meals ?Some sodas also ? ?Current Outpatient Medications on File Prior to Visit  ?Medication Sig Dispense Refill  ? aspirin 81 MG chewable tablet Chew 81 mg by mouth daily.    ? ciclopirox (LOPROX) 0.77 % cream APPLY AS DIRECTED APPLY TO FEET AND IN BETWEEN TOES TWICE DAILY 90 g 1  ? fluticasone (FLONASE) 50 MCG/ACT nasal spray Place 2 sprays into both nostrils daily. 16 g 0  ? magnesium oxide (MAG-OX) 400 MG tablet Take 400 mg by mouth daily.    ? Multiple Vitamin (MULTI-VITAMINS) TABS Take 1 tablet by mouth daily.    ? omeprazole (PRILOSEC) 20 MG capsule Take 20 mg by mouth daily.    ? tamsulosin (FLOMAX) 0.4 MG CAPS capsule TAKE 1 CAPSULE(0.4 MG) BY MOUTH DAILY AFTER SUPPER 30 capsule 11  ? valACYclovir (VALTREX) 1000 MG tablet Take 2 tablets at first sign of symptoms, repeat in 12 hours for a 1 day dose. Stop Treatment.  Repeat as needed for new cold sores 12 tablet 1  ? Zinc 50 MG CAPS     ? ?No current facility-administered medications on file prior to visit.  ? ? ?No Known Allergies ? ?Past Medical History:  ?Diagnosis Date  ? Barrett's esophagus   ? BPH with obstruction/lower urinary tract symptoms   ? Diverticulosis   ? GERD (gastroesophageal reflux disease)   ? Internal hemorrhoids   ? Obstructive sleep apnea   ? Peyronie's disease   ? mild and not a problem now  ? Prostate cancer ([place]) 05/2018  ? Gleason 3+4  ? Rosacea   ? Squamous cell carcinoma of skin 05/28/2017  ? 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 ? ?Past Surgical History:  ?Procedure Laterality Date  ? CATARACT EXTRACTION, BILATERAL   2015  ? ESOPHAGOGASTRODUODENOSCOPY (EGD) WITH PROPOFOL N/A 10/09/2017  ? Surgeon: Jonathon Bellows, MD; Location: [facility] ENDOSCOPY; REPEAT 3 YEARS 10/2020 Barrett's esophagus  ? TONSILLECTOMY AND ADENOIDECTOMY    ? ? ?Family History  ?Problem Relation Age of Onset  ? Stroke Father   ? Diabetes Father   ? Hypertension Sister   ? Stroke Brother   ? Esophageal cancer Brother   ? Heart disease Neg Hx   ? ? ?Social History  ? ?Socioeconomic History  ? Marital status: Married  ?  Spouse name: Not on file  ? Number of children: 2  ? Years of education: Not on file  ? Highest education level: Not on file  ?Occupational History  ? Occupation: Primary school teacher  ?  Comment: Retired  ?Tobacco Use  ? Smoking status: Former  ?  Passive exposure: Past  ? Smokeless tobacco: Never  ?Substance and Sexual Activity  ? Alcohol use: No  ?  Comment: quit 2 years ago with wife's breast cancer  ? Drug use: Not on file  ? Sexual activity: Not on file  ?Other Topics Concern  ? Not on file  ?Social History Narrative  ? Has living will  ? Wife, then daughter, should make  health care decisions  ? Would accept resuscitation but no prolonged ventilation  ? No tube feeds if cognitively unaware  ? ?Social Determinants of Health  ? ?Financial Resource Strain: Not on file  ?Food Insecurity: Not on file  ?Transportation Needs: Not on file  ?Physical Activity: Not on file  ?Stress: Not on file  ?Social Connections: Not on file  ?Intimate Partner Violence: Not on file  ? ?Review of Systems ?Concerned about his family history of stroke ?Eating well ?Weight stable ?Sleeps fine ?   ?Objective:  ? Physical Exam ?Constitutional:   ?   Appearance: Normal appearance.  ?Cardiovascular:  ?   Rate and Rhythm: Normal rate and regular rhythm.  ?   Heart sounds: No murmur heard. ?  No gallop.  ?Pulmonary:  ?   Effort: Pulmonary effort is normal.  ?   Breath sounds: Normal breath sounds. No wheezing or rales.  ?Musculoskeletal:  ?   Cervical back: Neck supple.  ?    Right lower leg: No edema.  ?   Left lower leg: No edema.  ?Lymphadenopathy:  ?   Cervical: No cervical adenopathy.  ?Neurological:  ?   Mental Status: He is alert.  ?Psychiatric:     ?   Mood and Affect: Mood normal.     ?   Behavior: Behavior normal.  ?  ? ? ? ? ?   ?Assessment & Plan:  ? ?

## 2021-07-09 NOTE — Assessment & Plan Note (Signed)
Weekly spells of fast heart rate for 20 seconds or so ?Sounds like SVT---small chance it could be atrial fib ?Discussed cutting back on caffeine ?Will check labs ?Cardiology evaluation---will need event monitor (and perhaps echo) ?

## 2021-08-19 ENCOUNTER — Inpatient Hospital Stay: Payer: Medicare HMO | Attending: Radiation Oncology

## 2021-08-19 DIAGNOSIS — C61 Malignant neoplasm of prostate: Secondary | ICD-10-CM | POA: Diagnosis present

## 2021-08-19 LAB — PSA: Prostatic Specific Antigen: 0.81 ng/mL (ref 0.00–4.00)

## 2021-08-22 DIAGNOSIS — I479 Paroxysmal tachycardia, unspecified: Secondary | ICD-10-CM | POA: Insufficient documentation

## 2021-08-22 NOTE — Progress Notes (Signed)
Cardiology Office Note  Date:  08/23/2021   ID:  Christian Carlson, DOB 09/24/1940, MRN [id number]  PCP:  Venia Carbon, MD   Chief Complaint  Patient presents with   OTHER    Palpitations c/o last 6 months irregular heart beat. Meds reviewed verbally with pt.    HPI:  Mr. Christian Carlson is a 81 year old gentleman with past medical history of Prostate cancer, treatment in 2020, XRT Barrett's esophagus Sleep apnea Who presents by referral from Dr. Silvio Pate for evaluation of his palpitations  Reports having approximately 6-week period of paroxysmal palpitations Described as a tachycardia, can last up to 20 seconds  will check his pulse at carotid Presents once a week or so, typically early in the morning but sometimes later in the day  3-4 episodes of palpitations this week Denies near syncope or syncope Active at baseline Lives at [facility] No chest pain or shortness of breath on exertion  Family hx of CVA  EKG personally reviewed by myself on todays visit Normal sinus rhythm rate 74 bpm no significant ST-T wave changes  PMH:   has a past medical history of Barrett's esophagus, BPH with obstruction/lower urinary tract symptoms, Diverticulosis, GERD (gastroesophageal reflux disease), Internal hemorrhoids, Obstructive sleep apnea, Peyronie's disease, Prostate cancer ([place]) (05/2018), Rosacea, and 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  PSH:    Past Surgical History:  Procedure Laterality Date   CATARACT EXTRACTION, BILATERAL  2015   ESOPHAGOGASTRODUODENOSCOPY (EGD) WITH PROPOFOL N/A 10/09/2017   Surgeon: Jonathon Bellows, MD; Location: [facility] ENDOSCOPY; REPEAT 3 YEARS 10/2020 Barrett's esophagus   TONSILLECTOMY AND ADENOIDECTOMY      Current Outpatient Medications  Medication Sig Dispense Refill   aspirin 81 MG chewable tablet Chew 81 mg by mouth daily.     ciclopirox (LOPROX) 0.77 % cream APPLY AS DIRECTED APPLY TO FEET AND IN BETWEEN TOES TWICE DAILY 90 g 1   magnesium oxide  (MAG-OX) 400 MG tablet Take 400 mg by mouth daily.     Multiple Vitamin (MULTI-VITAMINS) TABS Take 1 tablet by mouth daily.     omeprazole (PRILOSEC) 20 MG capsule Take 20 mg by mouth daily.     tamsulosin (FLOMAX) 0.4 MG CAPS capsule TAKE 1 CAPSULE(0.4 MG) BY MOUTH DAILY AFTER SUPPER 30 capsule 11   valACYclovir (VALTREX) 1000 MG tablet Take 2 tablets at first sign of symptoms, repeat in 12 hours for a 1 day dose. Stop Treatment.  Repeat as needed for new cold sores 12 tablet 1   Zinc 50 MG CAPS      No current facility-administered medications for this visit.     Allergies:   Patient has no known allergies.   Social History:  The patient  reports that he has quit smoking. He has been exposed to tobacco smoke. He has never used smokeless tobacco. He reports that he does not drink alcohol.   Family History:   family history includes Diabetes in his father; Esophageal cancer in his brother; Hypertension in his sister; Stroke in his brother, father, and paternal great-grandfather.    Review of Systems: Review of Systems  Constitutional: Negative.   HENT: Negative.    Respiratory: Negative.    Cardiovascular:  Positive for palpitations.  Gastrointestinal: Negative.   Musculoskeletal: Negative.   Neurological: Negative.   Psychiatric/Behavioral: Negative.    All other systems reviewed and are negative.   PHYSICAL EXAM: VS:  BP 112/80 (BP Location: Right Arm, Patient Position: Sitting, Cuff Size: Normal)   Ht '5\' 10"'$  (  1.778 m)   Wt 203 lb 8 oz (92.3 kg)   BMI 29.20 kg/m  , BMI Body mass index is 29.2 kg/m. GEN: Well nourished, well developed, in no acute distress HEENT: normal Neck: no JVD, carotid bruits, or masses Cardiac: RRR; no murmurs, rubs, or gallops,no edema  Respiratory:  clear to auscultation bilaterally, normal work of breathing GI: soft, nontender, nondistended, + BS MS: no deformity or atrophy Skin: warm and dry, no rash Neuro:  Strength and sensation are  intact Psych: euthymic mood, full affect    Recent Labs: 07/09/2021: ALT 18; BUN 13; Creatinine, Ser 0.98; Hemoglobin 13.4; Platelets 193.0; Potassium 4.2; Sodium 139    Lipid Panel No results found for: "CHOL", "HDL", "LDLCALC", "TRIG"    Wt Readings from Last 3 Encounters:  08/23/21 203 lb 8 oz (92.3 kg)  07/09/21 203 lb (92.1 kg)  02/14/21 204 lb (92.5 kg)       ASSESSMENT AND PLAN:  Problem List Items Addressed This Visit       Cardiology Problems   Paroxysmal tachycardia (HCC)     Other   Palpitations - Primary   Preventative health care   Obstructive sleep apnea    Palpitations Likely paroxysmal arrhythmia, etiology unclear Recommended Zio monitor to rule out arrhythmia such as atrial fibrillation/flutter Discussed implications of various types of arrhythmia If benign findings on monitor, may not need to treat Otherwise clinically stable, exam within normal limits, normal appearing EKG No strong indication for echocardiography We will wait on the results of the Zio monitor  Sleep apnea  Barrett's esophagus On omeprazole, stable   Total encounter time more than 50 minutes  Greater than 50% was spent in counseling and coordination of care with the patient    Signed, Esmond Plants, M.D., Ph.D. [place], [place]

## 2021-08-23 ENCOUNTER — Ambulatory Visit (INDEPENDENT_AMBULATORY_CARE_PROVIDER_SITE_OTHER): Payer: Medicare HMO

## 2021-08-23 ENCOUNTER — Ambulatory Visit (INDEPENDENT_AMBULATORY_CARE_PROVIDER_SITE_OTHER): Payer: Medicare HMO | Admitting: Cardiovascular Disease

## 2021-08-23 ENCOUNTER — Encounter: Payer: Self-pay | Admitting: Cardiovascular Disease

## 2021-08-23 VITALS — BP 112/80 | HR 74 | Ht 70.0 in | Wt 203.5 lb

## 2021-08-23 DIAGNOSIS — G4733 Obstructive sleep apnea (adult) (pediatric): Secondary | ICD-10-CM | POA: Diagnosis not present

## 2021-08-23 DIAGNOSIS — R002 Palpitations: Secondary | ICD-10-CM

## 2021-08-23 DIAGNOSIS — Z0000 Encounter for general adult medical examination without abnormal findings: Secondary | ICD-10-CM | POA: Diagnosis not present

## 2021-08-23 DIAGNOSIS — I479 Paroxysmal tachycardia, unspecified: Secondary | ICD-10-CM

## 2021-08-23 NOTE — Patient Instructions (Addendum)
We will order a Zio monitor for palpitations  Medication Instructions:  No changes  If you need a refill on your cardiac medications before your next appointment, please call your pharmacy.    Lab work: No new labs needed   Testing/Procedures:  1) Heart Monitor:  Length of Wear: 14 days  The monitor will be mailed directly to your home address within 3-5 business days. If you have not received your monitor after 5 business day, then please call the office at [phone], so we can follow up on this for you.   Your physician has recommended that you wear a Zio XT (heart) monitor.   This monitor is a medical device that records the heart's electrical activity. Doctors most often use these monitors to diagnose arrhythmias. Arrhythmias are problems with the speed or rhythm of the heartbeat. The monitor is a small device applied to your chest. You can wear one while you do your normal daily activities. While wearing this monitor if you have any symptoms to push the button and record what you felt. Once you have worn this monitor for the period of time provider prescribed (Usually 14 days), you will return the monitor device in the postage paid box. Once it is returned they will download the data collected and provide [place] with a report which the provider will then review and we will call you with those results. Important tips:  Avoid showering during the first 24 hours of wearing the monitor. Avoid excessive sweating to help maximize wear time. Do not submerge the device, no hot tubs, and no swimming pools. Keep any lotions or oils away from the patch. After 24 hours you may shower with the patch on. Take brief showers with your back facing the shower head.  Do not remove patch once it has been placed because that will interrupt data and decrease adhesive wear time. Push the button when you have any symptoms and write down what you were feeling. Once you have completed wearing your  monitor, remove and place into box which has postage paid and place in your outgoing mailbox.  If for some reason you have misplaced your box then call our office and we can provide another box and/or mail it off for you.    Follow-Up: At [facility], you and your health needs are our priority.  As part of our continuing mission to provide you with exceptional heart care, we have created designated Provider Care Teams.  These Care Teams include your primary Cardiologist (physician) and Advanced Practice Providers (APPs -  Physician Assistants and Nurse Practitioners) who all work together to provide you with the care you need, when you need it.  You will need a follow up appointment as needed (pending the results of your heart monitor)  Providers on your designated Care Team:   Murray Hodgkins, NP Christell Faith, PA-C Cadence Kathlen Mody, PA-C  COVID-19 Vaccine Information can be found at: [email or web address] For questions related to vaccine distribution or appointments, please email [email or web address] or call [phone].

## 2021-08-26 ENCOUNTER — Ambulatory Visit
Admission: RE | Admit: 2021-08-26 | Discharge: 2021-08-26 | Disposition: A | Discharge status: Home / Self Care | Payer: Medicare HMO | Source: Ambulatory Visit | Attending: Radiation Oncology | Admitting: Radiation Oncology

## 2021-08-26 ENCOUNTER — Encounter: Payer: Self-pay | Admitting: Radiation Oncology

## 2021-08-26 VITALS — BP 130/86 | HR 74 | Temp 98.3°F | Resp 16 | Ht 70.0 in | Wt 204.0 lb

## 2021-08-26 DIAGNOSIS — Z923 Personal history of irradiation: Secondary | ICD-10-CM | POA: Diagnosis not present

## 2021-08-26 DIAGNOSIS — C61 Malignant neoplasm of prostate: Secondary | ICD-10-CM | POA: Insufficient documentation

## 2021-08-26 NOTE — Progress Notes (Signed)
Radiation Oncology Follow up Note  Name: Christian Carlson   Date:   08/26/2021 MRN:  [id number] DOB: Aug 14, 1940    This 81 y.o. 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  REFERRING PROVIDER: LVenia Carbon MD  HPI: Patient is an 81year old male now out 6 months having completed IMRT radiation therapy for Gleason 7 adenocarcinoma prostate presenting with a PSA of 11.5 seen today in routine follow-up he is doing well.  He specifically denies any increased lower urinary tract symptoms diarrhea or fatigue..Marland Kitchen His PSA is gradually increasing was 0.1 to a year ago 0.556 months ago and now 0.81.  He is having no bone pain.  COMPLICATIONS OF TREATMENT: none  FOLLOW UP COMPLIANCE: keeps appointments   PHYSICAL EXAM:  BP 130/86 (BP Location: Right Arm, Patient Position: Sitting, Cuff Size: Normal)   Pulse 74   Temp 98.3 F (36.8 C) (Tympanic)   Resp 16   Ht '5\' 10"'$  (1.778 m)   Wt 204 lb (92.5 kg)   BMI 29.27 kg/m  Well-developed well-nourished patient in NAD. HEENT reveals PERLA, EOMI, discs not visualized.  Oral cavity is clear. No oral mucosal lesions are identified. Neck is clear without evidence of cervical or supraclavicular adenopathy. Lungs are clear to A&P. Cardiac examination is essentially unremarkable with regular rate and rhythm without murmur rub or thrill. Abdomen is benign with no organomegaly or masses noted. Motor sensory and DTR levels are equal and symmetric in the upper and lower extremities. Cranial nerves II through XII are grossly intact. Proprioception is intact. No peripheral adenopathy or edema is identified. No motor or sensory levels are noted. Crude visual fields are within normal range.  RADIOLOGY RESULTS: No current films for review  PLAN: Present time he has slowly increasing PSA after definitive treatment for his  prostate cancer.  I am going to follow-up in 4 months with a repeat PSA.  Should this be continue to elevate we will refer to medical oncology for opinion.  Patient is comfortable with this decision.  I would like to take this opportunity to thank you for allowing me to participate in the care of your patient..Noreene Filbert MD

## 2021-08-27 DIAGNOSIS — R002 Palpitations: Secondary | ICD-10-CM | POA: Diagnosis not present

## 2021-09-02 ENCOUNTER — Other Ambulatory Visit: Payer: Self-pay | Admitting: Radiation Oncology

## 2021-09-04 ENCOUNTER — Other Ambulatory Visit: Payer: Self-pay | Admitting: *Deleted

## 2021-09-04 DIAGNOSIS — C61 Malignant neoplasm of prostate: Secondary | ICD-10-CM

## 2021-09-20 ENCOUNTER — Encounter: Payer: Self-pay | Admitting: Internal Medicine

## 2021-09-20 ENCOUNTER — Ambulatory Visit (INDEPENDENT_AMBULATORY_CARE_PROVIDER_SITE_OTHER): Payer: Medicare HMO | Admitting: Internal Medicine

## 2021-09-20 DIAGNOSIS — M7061 Trochanteric bursitis, right hip: Secondary | ICD-10-CM

## 2021-09-20 NOTE — Progress Notes (Signed)
Subjective:    Patient ID: Christian Carlson, male    DOB: October 11, 1940, 81 y.o.   MRN: [id number]  HPI Here due to right leg and hip pain  Started about a week ago Started along the side of the hip--and now moving down the leg Pain worst when first standing---and feels pressure when bearing weight Has been able to walk[place] (but has slowed down)  No swelling Tried tylenol and aleve---they help temporarily  Current Outpatient Medications on File Prior to Visit  Medication Sig Dispense Refill   aspirin 81 MG chewable tablet Chew 81 mg by mouth daily.     ciclopirox (LOPROX) 0.77 % cream APPLY AS DIRECTED APPLY TO FEET AND IN BETWEEN TOES TWICE DAILY 90 g 1   magnesium oxide (MAG-OX) 400 MG tablet Take 400 mg by mouth daily.     Multiple Vitamin (MULTI-VITAMINS) TABS Take 1 tablet by mouth daily.     omeprazole (PRILOSEC) 20 MG capsule Take 20 mg by mouth daily.     tamsulosin (FLOMAX) 0.4 MG CAPS capsule TAKE 1 CAPSULE(0.4 MG) BY MOUTH DAILY AFTER AND SUPPER 30 capsule 11   valACYclovir (VALTREX) 1000 MG tablet Take 2 tablets at first sign of symptoms, repeat in 12 hours for a 1 day dose. Stop Treatment.  Repeat as needed for new cold sores 12 tablet 1   Zinc 50 MG CAPS      No current facility-administered medications on file prior to visit.    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    Past Surgical History:  Procedure Laterality Date   CATARACT EXTRACTION, BILATERAL  2015   ESOPHAGOGASTRODUODENOSCOPY (EGD) WITH PROPOFOL N/A 10/09/2017   Surgeon: Jonathon Bellows, MD;  Location: [facility] ENDOSCOPY; REPEAT 3 YEARS 10/2020 Barrett's esophagus   TONSILLECTOMY AND ADENOIDECTOMY      Family History  Problem Relation Age of Onset   Stroke Father    Diabetes Father    Hypertension Sister    Stroke Brother    Esophageal cancer Brother    Stroke Paternal Great-grandfather    Heart disease Neg Hx     Social History   Socioeconomic History   Marital status: Married    Spouse name: Not on file   Number of children: 2   Years of education: Not on file   Highest education level: Not on file  Occupational History   Occupation: Primary school teacher    Comment: Retired  Tobacco Use   Smoking status: Former    Passive exposure: Past   Smokeless tobacco: Never  Substance and Sexual Activity   Alcohol use: No    Comment: quit 2 years ago with wife's breast cancer   Drug use: Not on file   Sexual activity: Not on file  Other Topics Concern   Not on file  Social History Narrative   Has living will   Wife, then daughter, should make health care decisions   Would accept resuscitation but no prolonged ventilation   No tube feeds if  cognitively unaware   Social Determinants of Health   Financial Resource Strain: Not on file  Food Insecurity: Not on file  Transportation Needs: Not on file  Physical Activity: Not on file  Stress: Not on file  Social Connections: Not on file  Intimate Partner Violence: Not on file   Review of Systems No treatment for prostate cancer at present Last PSA 1 month ago-- 0.81     Objective:   Physical Exam Constitutional:      Appearance: Normal appearance.  Musculoskeletal:     Comments: No spine tenderness  Point tenderness over trochanteric bursa Normal ROM right hip  Neurological:     Mental Status: He is alert.     Comments: Normal strength in legs            Assessment & Plan:

## 2021-09-20 NOTE — Assessment & Plan Note (Signed)
Discussed that this is usually self limited Try diclofenac gel tid Ice if worsens Okay to use tylenol/aleve

## 2021-09-20 NOTE — Patient Instructions (Signed)
Please try over the counter diclofenac gel up to three times a day---right over the painful area.

## 2021-12-12 ENCOUNTER — Encounter: Payer: Self-pay | Admitting: Internal Medicine

## 2021-12-12 ENCOUNTER — Ambulatory Visit (INDEPENDENT_AMBULATORY_CARE_PROVIDER_SITE_OTHER): Payer: Medicare HMO | Admitting: Internal Medicine

## 2021-12-12 VITALS — BP 120/76 | HR 70 | Temp 97.7°F | Ht 68.5 in | Wt 202.0 lb

## 2021-12-12 DIAGNOSIS — K2270 Barrett's esophagus without dysplasia: Secondary | ICD-10-CM

## 2021-12-12 DIAGNOSIS — I479 Paroxysmal tachycardia, unspecified: Secondary | ICD-10-CM

## 2021-12-12 DIAGNOSIS — N401 Enlarged prostate with lower urinary tract symptoms: Secondary | ICD-10-CM

## 2021-12-12 DIAGNOSIS — Z0000 Encounter for general adult medical examination without abnormal findings: Secondary | ICD-10-CM | POA: Diagnosis not present

## 2021-12-12 DIAGNOSIS — C61 Malignant neoplasm of prostate: Secondary | ICD-10-CM | POA: Diagnosis not present

## 2021-12-12 DIAGNOSIS — N138 Other obstructive and reflux uropathy: Secondary | ICD-10-CM

## 2021-12-12 NOTE — Progress Notes (Signed)
Hearing Screening - Comments:: Has hearing aids. Not wearing them today. Vision Screening - Comments:: June 2023

## 2021-12-12 NOTE — Progress Notes (Signed)
Subjective:    Patient ID: Christian Carlson, male    DOB: June 16, 1940, 81 y.o.   MRN: [id number]  HPI Here for Medicare wellness visit and follow up of chronic health conditions Reviewed advanced directives Reviewed other doctors----Dr [facility], Dr Matthew Folks, Dr Kemp--dentist, Dr Rathman--urology No hospitalizations or surgery this year Not really exercising---other than playing golf. Discussed  Vision is fine Hearing aides work well No alcohol or tobacco One fall--on golf course---no injury No depression or anhedonia Independent with instrumental ADLs No sig memory issues  Still gets some palpitations --but not bad No chest pain or SOB No dizziness or syncope--except rare orthostatic symptoms No edema  Minor arthritic problems Knees---hip is better Uses topical Rx at times  Takes omeprazole daily--prevents heartburn No dysphagia  Void well Empties fine on the tamsulosin  Done with RT for prostate Last PSA did go up---going back to [place] next month  Current Outpatient Medications on File Prior to Visit  Medication Sig Dispense Refill   aspirin 81 MG chewable tablet Chew 81 mg by mouth daily.     ciclopirox (LOPROX) 0.77 % cream APPLY AS DIRECTED APPLY TO FEET AND IN BETWEEN TOES TWICE DAILY 90 g 1   magnesium oxide (MAG-OX) 400 MG tablet Take 400 mg by mouth daily.     Multiple Vitamin (MULTI-VITAMINS) TABS Take 1 tablet by mouth daily.     omeprazole (PRILOSEC) 20 MG capsule Take 20 mg by mouth daily.     tamsulosin (FLOMAX) 0.4 MG CAPS capsule TAKE 1 CAPSULE(0.4 MG) BY MOUTH DAILY AFTER AND SUPPER 30 capsule 11   valACYclovir (VALTREX) 1000 MG tablet Take 2 tablets at first sign of symptoms, repeat in 12 hours for a 1 day dose. Stop Treatment.  Repeat as needed for new cold sores 12 tablet 1   Zinc 50 MG CAPS      No current facility-administered medications on file prior to visit.    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    Past Surgical History:  Procedure Laterality Date   CATARACT EXTRACTION, BILATERAL  2015   ESOPHAGOGASTRODUODENOSCOPY (EGD) WITH PROPOFOL N/A 10/09/2017   Surgeon: Jonathon Bellows, MD; Location: [facility] ENDOSCOPY; REPEAT 3 YEARS 10/2020 Barrett's esophagus   TONSILLECTOMY AND ADENOIDECTOMY      Family History  Problem Relation Age of Onset   Stroke Father    Diabetes Father    Hypertension Sister    Stroke Brother    Esophageal cancer Brother    Stroke Paternal Great-grandfather    Heart disease Neg Hx     Social History   Socioeconomic History   Marital status: Married    Spouse name: Not on file   Number of children: 2   Years of education: Not on file   Highest education level: Not on file  Occupational History   Occupation: Primary school teacher    Comment: Retired  Tobacco Use   Smoking status: Former    Passive exposure: Past   Smokeless tobacco: Never  Substance and  Sexual Activity   Alcohol use: No    Comment: quit 2 years ago with wife's breast cancer   Drug use: Not on file   Sexual activity: Not on file  Other Topics Concern   Not on file  Social History Narrative   Has living will   Wife, then daughter, should make health care decisions   Would accept resuscitation but no prolonged ventilation   No tube feeds if cognitively unaware   Social Determinants of Health   Financial Resource Strain: Not on file  Food Insecurity: Not on file  Transportation Needs: Not on file  Physical Activity: Not on file  Stress: Not on file  Social Connections: Not on file  Intimate Partner  Violence: Not on file   Review of Systems Appetite is fine Weight is stable Sleeps well--nocturia x 1-4 times Wears seat belt Teeth okay--keeps up with dentist No sig back pain No suspicious skin lesions now Bowels move fine--rare blood on paper (hemorrhoid)    Objective:   Physical Exam Constitutional:      Appearance: Normal appearance.  HENT:     Mouth/Throat:     Comments: No lesions Eyes:     Conjunctiva/sclera: Conjunctivae normal.     Pupils: Pupils are equal, round, and reactive to light.  Cardiovascular:     Rate and Rhythm: Normal rate and regular rhythm.     Pulses: Normal pulses.     Heart sounds: No murmur heard.    No gallop.  Pulmonary:     Effort: Pulmonary effort is normal.     Breath sounds: Normal breath sounds. No wheezing or rales.  Abdominal:     Palpations: Abdomen is soft.     Tenderness: There is no abdominal tenderness.  Musculoskeletal:     Cervical back: Neck supple.     Right lower leg: No edema.     Left lower leg: No edema.  Lymphadenopathy:     Cervical: No cervical adenopathy.  Skin:    Findings: No lesion or rash.  Neurological:     General: No focal deficit present.     Mental Status: He is alert and oriented to person, place, and time.     Comments: Mini-cog okay--normal clock, recall 2/3  Psychiatric:        Mood and Affect: Mood normal.        Behavior: Behavior normal.            Assessment & Plan:

## 2021-12-12 NOTE — Assessment & Plan Note (Signed)
Voids okay with tamsulosin daily

## 2021-12-12 NOTE — Assessment & Plan Note (Signed)
Brief spells on monitor Minimal symptoms---if worsens, will start low dose metoprolol

## 2021-12-12 NOTE — Assessment & Plan Note (Signed)
I have personally reviewed the Medicare Annual Wellness questionnaire and have noted 1. The patient's medical and social history 2. Their use of alcohol, tobacco or illicit drugs 3. Their current medications and supplements 4. The patient's functional ability including ADL's, fall risks, home safety risks and hearing or visual             impairment. 5. Diet and physical activities 6. Evidence for depression or mood disorders  The patients weight, height, BMI and visual acuity have been recorded in the chart I have made referrals, counseling and provided education to the patient based review of the above and I have provided the pt with a written personalized care plan for preventive services.  I have provided you with a copy of your personalized plan for preventive services. Please take the time to review along with your updated medication list.  Done with screening colonoscopies Discussed walking more Td at pharmacy FLu vaccine at [place]---and considering COVID also

## 2021-12-12 NOTE — Assessment & Plan Note (Signed)
Continues on the omeprazole '20mg'$  daily

## 2021-12-12 NOTE — Assessment & Plan Note (Signed)
PSA did go up several months ago Will recheck---if further evidence of biologic recurrence, will need to restart lupron

## 2021-12-13 LAB — COMPREHENSIVE METABOLIC PANEL
ALT: 14 U/L (ref 0–53)
AST: 17 U/L (ref 0–37)
Albumin: 4.2 g/dL (ref 3.5–5.2)
Alkaline Phosphatase: 49 U/L (ref 39–117)
BUN: 17 mg/dL (ref 6–23)
CO2: 30 mEq/L (ref 19–32)
Calcium: 9 mg/dL (ref 8.4–10.5)
Chloride: 103 mEq/L (ref 96–112)
Creatinine, Ser: 1.24 mg/dL (ref 0.40–1.50)
GFR: 54.35 mL/min — ABNORMAL LOW (ref >60.00)
Glucose, Bld: 93 mg/dL (ref 70–99)
Potassium: 5 mEq/L (ref 3.5–5.1)
Sodium: 140 mEq/L (ref 135–145)
Total Bilirubin: 0.4 mg/dL (ref 0.2–1.2)
Total Protein: 6.8 g/dL (ref 6.0–8.3)

## 2021-12-13 LAB — CBC
HCT: 40.5 % (ref 39.0–52.0)
Hemoglobin: 13.5 g/dL (ref 13.0–17.0)
MCHC: 33.3 g/dL (ref 30.0–36.0)
MCV: 93.3 fl (ref 78.0–100.0)
Platelets: 201 10*3/uL (ref 150.0–400.0)
RBC: 4.34 Mil/uL (ref 4.22–5.81)
RDW: 13.9 % (ref 11.5–15.5)
WBC: 5.5 10*3/uL (ref 4.0–10.5)

## 2021-12-13 LAB — PSA: PSA: 1.92 ng/mL (ref 0.10–4.00)

## 2021-12-13 LAB — TSH: TSH: 4.55 u[IU]/mL (ref 0.35–5.50)

## 2021-12-19 ENCOUNTER — Other Ambulatory Visit: Payer: Medicare HMO

## 2021-12-26 ENCOUNTER — Ambulatory Visit
Admission: RE | Admit: 2021-12-26 | Discharge: 2021-12-26 | Disposition: A | Discharge status: Home / Self Care | Payer: Medicare HMO | Source: Ambulatory Visit | Attending: Radiation Oncology | Admitting: Radiation Oncology

## 2021-12-26 VITALS — BP 105/76 | HR 77 | Temp 95.9°F | Resp 16 | Ht 68.5 in | Wt 199.1 lb

## 2021-12-26 DIAGNOSIS — C61 Malignant neoplasm of prostate: Secondary | ICD-10-CM | POA: Diagnosis present

## 2021-12-26 DIAGNOSIS — Z923 Personal history of irradiation: Secondary | ICD-10-CM | POA: Diagnosis not present

## 2021-12-26 NOTE — Progress Notes (Signed)
Radiation Oncology Follow up Note  Name: Christian Carlson   Date:   12/26/2021 MRN:  [id number] DOB: 1941-02-11    This 81 y.o. 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  REFERRING PROVIDER: LVenia Carbon MD  HPI: Patient is a 81year old male now out 10 months having completed IMRT radiation therapy for stage IIa Gleason 7 adenocarcinoma of the prostate.  Seen today in routine follow-up he is doing well from a clinical standpoint specifically denies any increased lower urinary tract symptoms diarrhea or fatigue.  His PSA continues to climb.  It is doubled to 1.9 from 0.81 over the past 3 months.  He is having no bone pain..  COMPLICATIONS OF TREATMENT: none  FOLLOW UP COMPLIANCE: keeps appointments   PHYSICAL EXAM:  BP 105/76   Pulse 77   Temp (!) 95.9 F (35.5 C)   Resp 16   Ht 5' 8.5" (1.74 m)   Wt 199 lb 1.6 oz (90.3 kg)   BMI 29.83 kg/m  Well-developed well-nourished patient in NAD. HEENT reveals PERLA, EOMI, discs not visualized.  Oral cavity is clear. No oral mucosal lesions are identified. Neck is clear without evidence of cervical or supraclavicular adenopathy. Lungs are clear to A&P. Cardiac examination is essentially unremarkable with regular rate and rhythm without murmur rub or thrill. Abdomen is benign with no organomegaly or masses noted. Motor sensory and DTR levels are equal and symmetric in the upper and lower extremities. Cranial nerves II through XII are grossly intact. Proprioception is intact. No peripheral adenopathy or edema is identified. No motor or sensory levels are noted. Crude visual fields are within normal range.  RADIOLOGY RESULTS: PSMA PET scan ordered  PLAN: At this time like patient evaluated by medical oncology.  I have ordered a PSMA PET scan to rule out any possibility of metastatic disease  which might also be amenable to treatment for oligometastatic disease.  I have asked to see him back in 4 months for follow-up.  Patient knows to call with any concerns.  I would like to take this opportunity to thank you for allowing me to participate in the care of your patient..Noreene Filbert MD

## 2021-12-31 ENCOUNTER — Inpatient Hospital Stay: Payer: Medicare HMO

## 2021-12-31 ENCOUNTER — Inpatient Hospital Stay: Payer: Medicare HMO | Attending: Oncology | Admitting: Oncology

## 2021-12-31 ENCOUNTER — Encounter: Payer: Self-pay | Admitting: Oncology

## 2021-12-31 VITALS — BP 130/93 | HR 76 | Temp 97.3°F | Resp 18 | Wt 201.1 lb

## 2021-12-31 DIAGNOSIS — Z79899 Other long term (current) drug therapy: Secondary | ICD-10-CM | POA: Insufficient documentation

## 2021-12-31 DIAGNOSIS — G4733 Obstructive sleep apnea (adult) (pediatric): Secondary | ICD-10-CM | POA: Insufficient documentation

## 2021-12-31 DIAGNOSIS — K2270 Barrett's esophagus without dysplasia: Secondary | ICD-10-CM | POA: Diagnosis not present

## 2021-12-31 DIAGNOSIS — Z923 Personal history of irradiation: Secondary | ICD-10-CM | POA: Diagnosis not present

## 2021-12-31 DIAGNOSIS — Z7982 Long term (current) use of aspirin: Secondary | ICD-10-CM | POA: Diagnosis not present

## 2021-12-31 DIAGNOSIS — C61 Malignant neoplasm of prostate: Secondary | ICD-10-CM | POA: Insufficient documentation

## 2021-12-31 DIAGNOSIS — K219 Gastro-esophageal reflux disease without esophagitis: Secondary | ICD-10-CM | POA: Diagnosis not present

## 2022-01-01 NOTE — Progress Notes (Signed)
Hematology/Oncology Consult note [facility] [phone] [phone]  Patient Care Team: Venia Carbon, MD as PCP - General (Internal Medicine)   Name of the patient: Christian Carlson  [id number]  1940/09/12    Reason for referral- prostate cancer   Referring physician- Dr. 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  Only 1 core was submitted for pathology diagnosis and therefore additional information is not known based on that biopsy specimen.  He received IMRT radiation therapy for the same.  He has not had a prostatectomy.  Patient received 1 dose of Lupron back in April 2020.  He has now been referred to [place] for rising PSA.  Most recent PSA from 12/12/2021 was 1.92.  He is already scheduled to undergo PSMA PET scan in 1 weeks time.  He denies any specific complaints at this time  ECOG PS- 1  Pain scale- 0   Review of systems- Review of Systems  Constitutional:  Negative for chills, fever, malaise/fatigue and weight loss.  HENT:  Negative for congestion, ear discharge and nosebleeds.   Eyes:  Negative for blurred vision.  Respiratory:  Negative for cough, hemoptysis, sputum production, shortness of breath and wheezing.   Cardiovascular:  Negative for chest pain, palpitations, orthopnea and claudication.  Gastrointestinal:  Negative for abdominal pain, blood in stool, constipation, diarrhea, heartburn, melena, nausea and vomiting.  Genitourinary:  Negative for dysuria, flank pain, frequency, hematuria and urgency.  Musculoskeletal:  Negative for back pain, joint pain and myalgias.  Skin:  Negative for rash.  Neurological:  Negative for dizziness, tingling, focal weakness, seizures, weakness and headaches.  Endo/Heme/Allergies:  Does not bruise/bleed easily.  Psychiatric/Behavioral:  Negative for  depression and suicidal ideas. The patient does not have insomnia.     No Known Allergies  Patient Active Problem List   Diagnosis Date Noted   Trochanteric bursitis of right hip 09/20/2021   Paroxysmal tachycardia ([place]) 08/22/2021   Prostate cancer ([place]) 11/29/2018   Preventative health care 08/04/2017   Advance directive discussed with patient 11/11/2016   Obstructive sleep apnea    Barrett's esophagus    BPH with obstruction/lower urinary tract symptoms    Rosacea    Internal hemorrhoids 02/19/2011   Peyronie's disease 02/11/2010   Diverticulosis of colon 01/06/2008     Past Medical History:  Diagnosis Date   Barrett's esophagus    BPH with obstruction/lower urinary tract symptoms    Diverticulosis    GERD (gastroesophageal reflux disease)    Internal hemorrhoids    Obstructive sleep apnea    Peyronie's disease    mild and not a problem now   Prostate cancer ([place]) 05/2018   Gleason 3+4   Rosacea    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     Past Surgical History:  Procedure Laterality Date   CATARACT EXTRACTION, BILATERAL  2015   ESOPHAGOGASTRODUODENOSCOPY (EGD) WITH PROPOFOL N/A 10/09/2017   Surgeon: Jonathon Bellows, MD; Location: [facility] ENDOSCOPY; REPEAT 3 YEARS 10/2020 Barrett's esophagus   TONSILLECTOMY AND ADENOIDECTOMY      Social History   Socioeconomic History   Marital status: Married    Spouse name: Not on file   Number of children: 2   Years of education: Not on file   Highest education level: Not on file  Occupational History   Occupation: Primary school teacher    Comment: Retired  Tobacco Use   Smoking status: Former    Passive exposure: Past   Smokeless tobacco: Never  Substance and Sexual Activity   Alcohol use: No    Comment: quit 2 years ago with wife's breast cancer   Drug use: Not on file   Sexual activity: Not on file  Other Topics Concern   Not on file  Social History Narrative   Has  living will   Wife, then daughter, should make health care decisions   Would accept resuscitation but no prolonged ventilation   No tube feeds if cognitively unaware   Social Determinants of Health   Financial Resource Strain: Not on file  Food Insecurity: Not on file  Transportation Needs: Not on file  Physical Activity: Not on file  Stress: Not on file  Social Connections: Not on file  Intimate Partner Violence: Not on file     Family History  Problem Relation Age of Onset   Stroke Father    Diabetes Father    Hypertension Sister    Stroke Brother    Esophageal cancer Brother    Stroke Paternal Great-grandfather    Heart disease Neg Hx      Current Outpatient Medications:    aspirin 81 MG chewable tablet, Chew 81 mg by mouth daily., Disp: , Rfl:    ciclopirox (LOPROX) 0.77 % cream, APPLY AS DIRECTED APPLY TO FEET AND IN BETWEEN TOES TWICE DAILY, Disp: 90 g, Rfl: 1   magnesium oxide (MAG-OX) 400 MG tablet, Take 400 mg by mouth daily., Disp: , Rfl:    Multiple Vitamin (MULTI-VITAMINS) TABS, Take 1 tablet by mouth daily., Disp: , Rfl:    omeprazole (PRILOSEC) 20 MG capsule, Take 20 mg by mouth daily., Disp: , Rfl:    tamsulosin (FLOMAX) 0.4 MG CAPS capsule, TAKE 1 CAPSULE(0.4 MG) BY MOUTH DAILY AFTER AND SUPPER, Disp: 30 capsule, Rfl: 11   Zinc 50 MG CAPS, , Disp: , Rfl:    valACYclovir (VALTREX) 1000 MG tablet, Take 2 tablets at first sign of symptoms, repeat in 12 hours for a 1 day dose. Stop Treatment.  Repeat as needed for new cold sores (Patient not taking: Reported on 12/31/2021), Disp: 12 tablet, Rfl: 1   Physical exam:  Vitals:   12/31/21 1411  BP: (!) 130/93  Pulse: 76  Resp: 18  Temp: (!) 97.3 F (36.3 C)  SpO2: 99%  Weight: 201 lb 1.6 oz (91.2 kg)   Physical Exam Cardiovascular:     Rate and Rhythm: Normal rate and regular rhythm.     Heart sounds: Normal heart sounds.  Pulmonary:     Effort: Pulmonary effort is normal.     Breath sounds: Normal  breath sounds.  Abdominal:     General: Bowel sounds are normal.     Palpations: Abdomen is soft.  Skin:    General: Skin is warm and dry.  Neurological:     Mental Status: He is alert and oriented to person, place, and time.           Latest Ref Rng & Units 12/12/2021    5:01 PM  CMP  Glucose 70 - 99 mg/dL 93   BUN 6 - 23 mg/dL 17   Creatinine 0.40 - 1.50 mg/dL 1.24   Sodium 135 - 145 mEq/L 140   Potassium 3.5 - 5.1 mEq/L 5.0   Chloride 96 - 112 mEq/L 103   CO2 19 -  32 mEq/L 30   Calcium 8.4 - 10.5 mg/dL 9.0   Total Protein 6.0 - 8.3 g/dL 6.8   Total Bilirubin 0.2 - 1.2 mg/dL 0.4   Alkaline Phos 39 - 117 U/L 49   AST 0 - 37 U/L 17   ALT 0 - 53 U/L 14       Latest Ref Rng & Units 12/12/2021    5:01 PM  CBC  WBC 4.0 - 10.5 K/uL 5.5   Hemoglobin 13.0 - 17.0 g/dL 13.5   Hematocrit 39.0 - 52.0 % 40.5   Platelets 150.0 - 400.0 K/uL 201.0      Assessment and plan- Patient is a 81 y.o. male with nonmetastatic castrate sensitive prostate cancer referred for further management  Patient diagnosed with Gleason 7 prostate adenocarcinoma in 2020 based on 1 core biopsy of the prostate for which she received IMRT.  He has received 1 dose of Lupron back in 2020.PSA has been gradually going up from 0.352 years ago to 0.61-year ago and presently at 1.92.  Based on his prior PSA values his PSA doubling time is roughly 6.2 months.  Discussed with the patient differences between metastatic and nonmetastatic prostate cancer which will be determined on the basis of his PSMA's PET scan.  Since he has not received androgen deprivation therapy since 2020 he still has castrate sensitive disease.  I will therefore plan to initiate Lupron at this time 45 mg dosing every 6 months.  Depending on his PSA response I will decide if additional oral androgen deprivation therapy is needed.  I will discuss genetic testing with him down the line.  I will see him tentatively in 2 weeks time to discuss his PSMA  PET scan for his first dose of Lupron.   Thank you for this kind referral and the opportunity to participate in the care of this patient   Visit Diagnosis 1. Prostate cancer [facility])     Dr. Randa Evens, MD, MPH [facility] at [facility] [phone] 01/01/2022

## 2022-01-02 ENCOUNTER — Ambulatory Visit (INDEPENDENT_AMBULATORY_CARE_PROVIDER_SITE_OTHER): Payer: Medicare HMO | Admitting: Internal Medicine

## 2022-01-02 ENCOUNTER — Encounter: Payer: Self-pay | Admitting: Internal Medicine

## 2022-01-02 DIAGNOSIS — R1909 Other intra-abdominal and pelvic swelling, mass and lump: Secondary | ICD-10-CM | POA: Insufficient documentation

## 2022-01-02 NOTE — Assessment & Plan Note (Signed)
He describes what sounds like a hernia--but I cannot reproduce it with exam Reassured--no action needed for now If has recurrent symptoms, will set up with surgeon Discussed care with lifting

## 2022-01-02 NOTE — Progress Notes (Signed)
Subjective:    Patient ID: Christian Carlson, male    DOB: January 28, 1941, 81 y.o.   MRN: [id number]  HPI Here with concern about a lump in his abdomen  Felt a hard lump in his right groin--thought it was a hernia First noticed it 3 days ago Did carry a lot of groceries to be donated--noticed it after Then played golf yesterday--more prominent then and caused some discomfort  No prior hernia repairs Did have the prostate cancer --but just RT  Current Outpatient Medications on File Prior to Visit  Medication Sig Dispense Refill   aspirin 81 MG chewable tablet Chew 81 mg by mouth daily.     ciclopirox (LOPROX) 0.77 % cream APPLY AS DIRECTED APPLY TO FEET AND IN BETWEEN TOES TWICE DAILY 90 g 1   magnesium oxide (MAG-OX) 400 MG tablet Take 400 mg by mouth daily.     Multiple Vitamin (MULTI-VITAMINS) TABS Take 1 tablet by mouth daily.     omeprazole (PRILOSEC) 20 MG capsule Take 20 mg by mouth daily.     tamsulosin (FLOMAX) 0.4 MG CAPS capsule TAKE 1 CAPSULE(0.4 MG) BY MOUTH DAILY AFTER AND SUPPER 30 capsule 11   valACYclovir (VALTREX) 1000 MG tablet Take 2 tablets at first sign of symptoms, repeat in 12 hours for a 1 day dose. Stop Treatment.  Repeat as needed for new cold sores 12 tablet 1   Zinc 50 MG CAPS      No current facility-administered medications on file prior to visit.    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    Past Surgical History:  Procedure Laterality Date   CATARACT EXTRACTION, BILATERAL  2015   ESOPHAGOGASTRODUODENOSCOPY (EGD) WITH PROPOFOL N/A 10/09/2017   Surgeon:  Jonathon Bellows, MD; Location: [facility] ENDOSCOPY; REPEAT 3 YEARS 10/2020 Barrett's esophagus   TONSILLECTOMY AND ADENOIDECTOMY      Family History  Problem Relation Age of Onset   Stroke Father    Diabetes Father    Hypertension Sister    Stroke Brother    Esophageal cancer Brother    Stroke Paternal Great-grandfather    Heart disease Neg Hx     Social History   Socioeconomic History   Marital status: Married    Spouse name: Not on file   Number of children: 2   Years of education: Not on file   Highest education level: Not on file  Occupational History   Occupation: Primary school teacher    Comment: Retired  Tobacco Use   Smoking status: Former    Passive exposure: Past   Smokeless tobacco: Never  Substance and Sexual Activity   Alcohol use: No    Comment: quit 2 years ago with wife's breast cancer   Drug use: Not on file   Sexual activity: Not on file  Other Topics Concern   Not on file  Social History Narrative   Has living will   Wife, then daughter, should make health care decisions   Would accept resuscitation but no prolonged ventilation  No tube feeds if cognitively unaware   Social Determinants of Health   Financial Resource Strain: Not on file  Food Insecurity: Not on file  Transportation Needs: Not on file  Physical Activity: Not on file  Stress: Not on file  Social Connections: Not on file  Intimate Partner Violence: Not on file   Review of Systems Appetite is good No N/V     Objective:   Physical Exam Constitutional:      Appearance: Normal appearance.  Abdominal:     Comments: Prominent inguinal fat pads Even with pushing---no clear inguinal hernia  Genitourinary:    Comments: Normal testes--no mass in scrotum Neurological:     Mental Status: He is alert.            Assessment & Plan:

## 2022-01-03 ENCOUNTER — Other Ambulatory Visit: Payer: Self-pay | Admitting: Internal Medicine

## 2022-01-03 NOTE — Progress Notes (Signed)
Dr.Rao-I have ordered the Eligard 45 mg as requested by authorization team.  Patient has an appointment with you on the 30th/today patient gets his Eligard.  Thanks GB

## 2022-01-08 ENCOUNTER — Ambulatory Visit: Payer: Medicare HMO

## 2022-01-08 ENCOUNTER — Ambulatory Visit
Admission: RE | Admit: 2022-01-08 | Discharge: 2022-01-08 | Disposition: A | Discharge status: Home / Self Care | Payer: Medicare HMO | Source: Ambulatory Visit | Attending: Radiation Oncology | Admitting: Radiation Oncology

## 2022-01-08 DIAGNOSIS — K4090 Unilateral inguinal hernia, without obstruction or gangrene, not specified as recurrent: Secondary | ICD-10-CM | POA: Diagnosis not present

## 2022-01-08 DIAGNOSIS — C61 Malignant neoplasm of prostate: Secondary | ICD-10-CM | POA: Diagnosis present

## 2022-01-08 MED ORDER — PIFLIFOLASTAT F 18 (PYLARIFY) INJECTION
9.0000 | Freq: Once | INTRAVENOUS | Status: AC
  Administered 2022-01-08: 9.26 via INTRAVENOUS

## 2022-01-10 ENCOUNTER — Encounter: Payer: Self-pay | Admitting: Internal Medicine

## 2022-01-13 ENCOUNTER — Inpatient Hospital Stay: Payer: Medicare HMO | Admitting: Oncology

## 2022-01-13 ENCOUNTER — Encounter: Payer: Self-pay | Admitting: Oncology

## 2022-01-13 ENCOUNTER — Inpatient Hospital Stay: Payer: Medicare HMO

## 2022-01-13 VITALS — BP 118/88 | HR 90 | Resp 18 | Wt 193.5 lb

## 2022-01-13 DIAGNOSIS — Z7189 Other specified counseling: Secondary | ICD-10-CM

## 2022-01-13 DIAGNOSIS — C61 Malignant neoplasm of prostate: Secondary | ICD-10-CM

## 2022-01-13 MED ORDER — LEUPROLIDE ACETATE (6 MONTH) 45 MG ~~LOC~~ KIT
45.0000 mg | PACK | Freq: Once | SUBCUTANEOUS | Status: AC
  Administered 2022-01-13: 45 mg via SUBCUTANEOUS
  Filled 2022-01-13: qty 45

## 2022-01-13 NOTE — Progress Notes (Signed)
Pt will like to discuss what is more important as far as trreatment his hernia or cancer? Pt is concerned that hernia is causing the cancer.

## 2022-01-14 ENCOUNTER — Encounter: Payer: Self-pay | Admitting: Internal Medicine

## 2022-01-15 ENCOUNTER — Ambulatory Visit: Payer: Medicare HMO

## 2022-01-19 ENCOUNTER — Encounter: Payer: Self-pay | Admitting: Internal Medicine

## 2022-01-19 NOTE — Progress Notes (Signed)
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  Only 1 core was submitted for pathology diagnosis and therefore additional information is not known based on that biopsy specimen.  He received IMRT radiation therapy for the same.  He has not had a prostatectomy.  Patient received 1 dose of Lupron back in April 2020.  He has now been referred to [place] for rising PSA.  Most recent PSA from 12/12/2021 was 1.92.     PSMA PET scan showed no focal activity to localized prostatic carcinoma.  2 very small PSA I would right internal iliac lymph nodes consistent with nodal metastases with an SUV uptake of 16.5.  No evidence of nodal metastases outside of pelvis.  No visceral metastases or skeletal metastases.  Interval history-no acute issues since his last visit.  ECOG PS- 1 Pain scale- 0   Review of systems- Review of Systems  Constitutional:  Negative for chills, fever, malaise/fatigue and weight loss.  HENT:  Negative for congestion, ear discharge and nosebleeds.   Eyes:  Negative for blurred vision.  Respiratory:  Negative for cough, hemoptysis, sputum production, shortness of breath and wheezing.   Cardiovascular:  Negative for chest pain, palpitations, orthopnea and claudication.  Gastrointestinal:  Negative for abdominal pain, blood in stool, constipation, diarrhea, heartburn, melena, nausea and vomiting.  Genitourinary:  Negative for dysuria,  flank pain, frequency, hematuria and urgency.  Musculoskeletal:  Negative for back pain, joint pain and myalgias.  Skin:  Negative for rash.  Neurological:  Negative for dizziness, tingling, focal weakness, seizures, weakness and headaches.  Endo/Heme/Allergies:  Does not bruise/bleed easily.  Psychiatric/Behavioral:  Negative for depression and suicidal ideas. The patient does not have insomnia.       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     Past Surgical History:  Procedure Laterality Date   CATARACT EXTRACTION, BILATERAL  2015   ESOPHAGOGASTRODUODENOSCOPY (EGD) WITH PROPOFOL N/A 10/09/2017   Surgeon: Jonathon Bellows, MD; Location: [facility] ENDOSCOPY; REPEAT 3 YEARS 10/2020 Barrett's esophagus   TONSILLECTOMY AND ADENOIDECTOMY      Social History   Socioeconomic History   Marital status: Married    Spouse name: Not on file   Number of children: 2   Years of education: Not on file   Highest education level: Not on file  Occupational History   Occupation: Primary school teacher    Comment: Retired  Tobacco Use   Smoking status: Former    Passive exposure: Past   Smokeless tobacco: Never  Substance  and Sexual Activity   Alcohol use: No    Comment: quit 2 years ago with wife's breast cancer   Drug use: Not on file   Sexual activity: Not on file  Other Topics Concern   Not on file  Social History Narrative   Has living will   Wife, then daughter, should make health care decisions   Would accept resuscitation but no prolonged ventilation   No tube feeds if cognitively  unaware   Social Determinants of Health   Financial Resource Strain: Not on file  Food Insecurity: Not on file  Transportation Needs: Not on file  Physical Activity: Not on file  Stress: Not on file  Social Connections: Not on file  Intimate Partner Violence: Not on file    Family History  Problem Relation Age of Onset   Stroke Father    Diabetes Father    Hypertension Sister    Stroke Brother    Esophageal cancer Brother    Stroke Paternal Great-grandfather    Heart disease Neg Hx      Current Outpatient Medications:    aspirin 81 MG chewable tablet, Chew 81 mg by mouth daily., Disp: , Rfl:    ciclopirox (LOPROX) 0.77 % cream, APPLY AS DIRECTED APPLY TO FEET AND IN BETWEEN TOES TWICE DAILY, Disp: 90 g, Rfl: 1   magnesium oxide (MAG-OX) 400 MG tablet, Take 400 mg by mouth daily., Disp: , Rfl:    Multiple Vitamin (MULTI-VITAMINS) TABS, Take 1 tablet by mouth daily., Disp: , Rfl:    omeprazole (PRILOSEC) 20 MG capsule, Take 20 mg by mouth daily., Disp: , Rfl:    tamsulosin (FLOMAX) 0.4 MG CAPS capsule, TAKE 1 CAPSULE(0.4 MG) BY MOUTH DAILY AFTER AND SUPPER, Disp: 30 capsule, Rfl: 11   Zinc 50 MG CAPS, , Disp: , Rfl:    valACYclovir (VALTREX) 1000 MG tablet, Take 2 tablets at first sign of symptoms, repeat in 12 hours for a 1 day dose. Stop Treatment.  Repeat as needed for new cold sores (Patient not taking: Reported on 01/13/2022), Disp: 12 tablet, Rfl: 1  Physical exam:  Vitals:   01/13/22 1524  BP: 118/88  Pulse: 90  Resp: 18  SpO2: 94%  Weight: 193 lb 8 oz (87.8 kg)   Physical Exam Constitutional:      General: He is not in acute distress. Cardiovascular:     Rate and Rhythm: Normal rate and regular rhythm.     Heart sounds: Normal heart sounds.  Pulmonary:     Effort: Pulmonary effort is normal.     Breath sounds: Normal breath sounds.  Abdominal:     General: Bowel sounds are normal.     Palpations: Abdomen is soft.  Skin:    General: Skin is warm and dry.   Neurological:     Mental Status: He is alert and oriented to person, place, and time.         Latest Ref Rng & Units 12/12/2021    5:01 PM  CMP  Glucose 70 - 99 mg/dL 93   BUN 6 - 23 mg/dL 17   Creatinine 0.40 - 1.50 mg/dL 1.24   Sodium 135 - 145 mEq/L 140   Potassium 3.5 - 5.1 mEq/L 5.0   Chloride 96 - 112 mEq/L 103   CO2 19 - 32 mEq/L 30   Calcium 8.4 - 10.5 mg/dL 9.0   Total Protein 6.0 - 8.3 g/dL 6.8   Total Bilirubin 0.2 - 1.2 mg/dL 0.4  Alkaline Phos 39 - 117 U/L 49   AST 0 - 37 U/L 17   ALT 0 - 53 U/L 14       Latest Ref Rng & Units 12/12/2021    5:01 PM  CBC  WBC 4.0 - 10.5 K/uL 5.5   Hemoglobin 13.0 - 17.0 g/dL 13.5   Hematocrit 39.0 - 52.0 % 40.5   Platelets 150.0 - 400.0 K/uL 201.0     No images are attached to the encounter.  NM PET (PSMA) SKULL TO MID THIGH  Result Date: 01/10/2022 CLINICAL DATA:  Prostate carcinoma with biochemical recurrence. EXAM: NUCLEAR MEDICINE PET SKULL BASE TO THIGH TECHNIQUE: mCi F18 Piflufolastat (Pylarify) was injected intravenously. Full-ring PET imaging was performed from the skull base to thigh after the radiotracer. CT data was obtained and used for attenuation correction and anatomic localization. COMPARISON:  None Available. FINDINGS: NECK No radiotracer activity in neck lymph nodes. Incidental CT finding: None. CHEST No radiotracer accumulation within mediastinal or hilar lymph nodes. No suspicious pulmonary nodules on the CT scan. Incidental CT finding: None. ABDOMEN/PELVIS Prostate: Mild nonspecific activity within the prostate gland. Lymph nodes: PSMA radiotracer avid lymph node in the deep RIGHT pelvis along the internal iliac vessels measures 4 mm (image 226) with SUV max equal 16.5. Slightly more superior similar lymph node activity with SUV max equal 13.3 on image 222. Discrete nodal tissue is difficult to measure. No abnormal radiotracer avid lymph nodes are tissue outside pelvis. Liver: No abnormal focal radiotracer  activity within liver. Multiple small hypodense lesions in liver favored benign cysts. Incidental CT finding: None. SKELETON No focal activity to suggest skeletal metastasis. IMPRESSION: 1. No focal activity to localized prostate carcinoma within the prostate gland. 2. Two very small PSMA avid RIGHT internal iliac lymph nodes consistent with nodal metastasis. 3. No evidence of nodal metastasis outside of the pelvis. 4. No visceral metastasis or skeletal metastasis Electronically Signed   By: Suzy Bouchard M.D.   On: 01/10/2022 13:49     Assessment and plan- Patient is a 81 y.o. 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  It would still constitute stage IVaDisease.  My plan is to offer him ADT for 2 years starting today.  Also referring the patient to radiation oncology for consideration of palliative radiation to his internal iliac group of lymph nodes.  Radiation is typically started about 2 months after his first dose of Lupron.    We will continue to monitor his PSA closely and assess response with ADT.  Discussed risks and benefits of ADT including all but not limited to hot flashes, fatigue, anemia, decreased muscle mass and worsening bone health.  Treatment will be given with a potential palliative intent.  Patient understands and agrees to proceed as planned.  I will see him back in 3 months with CBC with differential CMP and PSA.  He is getting his 58-monthdose of Lupron today.   Visit Diagnosis 1. [place]   2. Goals of care, counseling/discussion      Dr. ARanda Evens MD, MPH [id number]/07/2021 7:50 PM

## 2022-01-22 ENCOUNTER — Encounter: Payer: Self-pay | Admitting: Internal Medicine

## 2022-01-22 ENCOUNTER — Ambulatory Visit
Admission: RE | Admit: 2022-01-22 | Discharge: 2022-01-22 | Disposition: A | Discharge status: Home / Self Care | Payer: Medicare HMO | Source: Ambulatory Visit | Attending: Radiation Oncology | Admitting: Radiation Oncology

## 2022-01-22 VITALS — BP 105/75 | HR 75 | Resp 16 | Ht 68.5 in | Wt 197.0 lb

## 2022-01-22 DIAGNOSIS — C61 Malignant neoplasm of prostate: Secondary | ICD-10-CM | POA: Insufficient documentation

## 2022-01-22 DIAGNOSIS — R1909 Other intra-abdominal and pelvic swelling, mass and lump: Secondary | ICD-10-CM

## 2022-01-22 NOTE — Progress Notes (Signed)
Radiation Oncology Follow up Note  Name: Christian Carlson   Date:   01/22/2022 MRN:  [id number] DOB: 1940-10-01    This 81 y.o. 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  His PSA started doubling from 0.8-1.9 over the past several months.  He was referred to Dr. Janese Banks.  PSMA PET/CT showed no activity in his prostate although there are 2 small PSMA avid right internal iliac lymph nodes consistent with nodal metastasis.  No evidence of disease outside his pelvis and no visceral skeletal metastasis was noted.  Patient is asymptomatic.  He has been started on Lupron by Dr. Janese Banks.  He is now referred back to radiation for consideration of treatment to his pelvis.  Patient was treated back in 2020 and at the time [facility] nomogram showing a 4% chance of lymph node involvement based on his tumor parameters  COMPLICATIONS OF TREATMENT: none  FOLLOW UP COMPLIANCE: keeps appointments   PHYSICAL EXAM:  BP 105/75   Pulse 75   Resp 16   Ht 5' 8.5" (1.74 m)   Wt 197 lb (89.4 kg)   BMI 29.52 kg/m  Well-developed well-nourished patient in NAD. HEENT reveals PERLA, EOMI, discs not visualized.  Oral cavity is clear. No oral mucosal lesions are identified. Neck is clear without evidence of cervical or supraclavicular adenopathy. Lungs are clear to A&P. Cardiac examination is essentially unremarkable with regular rate and rhythm without murmur rub or thrill. Abdomen is benign with no organomegaly or masses noted. Motor sensory and DTR levels are equal and symmetric in the upper and lower extremities. Cranial nerves II  through XII are grossly intact. Proprioception is intact. No peripheral adenopathy or edema is identified. No motor or sensory levels are noted. Crude visual fields are within normal range.  RADIOLOGY RESULTS: PSMA PET scan reviewed compatible with above-stated findings  PLAN: At this time like to go ahead with IMRT radiation therapy to his pelvis.  I would choose IMRT to spare critical structures such as pretreated prostate bladder or rectum.  I would plan on delivering 60 Pearline Cables to the avid PSMA PET positive nodes and 50 Gray to the remainder of his pelvic nodes.  Risks and benefits of treatment occluding possible diarrhea fatigue alteration of blood counts and skin reaction all were reviewed with the patient.  I have personally set up and ordered CT simulation and will use a PSMA PET fusion study for his treatment planning.  Patient and wife both comprehend my treatment plan well.  I also would advocate for continued ADT therapy for 2 to 3 years under Dr. Elroy Channel direction.  I would like to take this opportunity to thank you for allowing me to participate in the care of your patient.Noreene Filbert, MD

## 2022-01-26 ENCOUNTER — Encounter: Payer: Self-pay | Admitting: Oncology

## 2022-01-27 ENCOUNTER — Ambulatory Visit: Payer: Medicare HMO | Admitting: Radiation Oncology

## 2022-01-29 ENCOUNTER — Ambulatory Visit: Payer: Medicare HMO | Admitting: Oncology

## 2022-01-29 ENCOUNTER — Ambulatory Visit: Payer: Medicare HMO

## 2022-01-29 ENCOUNTER — Other Ambulatory Visit: Payer: Medicare HMO

## 2022-02-03 ENCOUNTER — Ambulatory Visit
Admission: RE | Admit: 2022-02-03 | Discharge: 2022-02-03 | Disposition: A | Discharge status: Home / Self Care | Payer: Medicare HMO | Source: Ambulatory Visit | Attending: Radiation Oncology | Admitting: Radiation Oncology

## 2022-02-03 DIAGNOSIS — C61 Malignant neoplasm of prostate: Secondary | ICD-10-CM | POA: Diagnosis present

## 2022-02-10 DIAGNOSIS — C61 Malignant neoplasm of prostate: Secondary | ICD-10-CM | POA: Diagnosis not present

## 2022-02-14 ENCOUNTER — Other Ambulatory Visit: Payer: Self-pay | Admitting: *Deleted

## 2022-02-14 DIAGNOSIS — C61 Malignant neoplasm of prostate: Secondary | ICD-10-CM

## 2022-02-17 ENCOUNTER — Ambulatory Visit: Admission: RE | Admit: 2022-02-17 | Payer: Medicare HMO | Source: Ambulatory Visit

## 2022-02-18 ENCOUNTER — Other Ambulatory Visit: Payer: Self-pay

## 2022-02-18 ENCOUNTER — Ambulatory Visit
Admission: RE | Admit: 2022-02-18 | Discharge: 2022-02-18 | Disposition: A | Discharge status: Home / Self Care | Payer: Medicare HMO | Source: Ambulatory Visit | Attending: Radiation Oncology | Admitting: Radiation Oncology

## 2022-02-18 DIAGNOSIS — C61 Malignant neoplasm of prostate: Secondary | ICD-10-CM | POA: Insufficient documentation

## 2022-02-18 LAB — RAD ONC ARIA SESSION SUMMARY
Course Elapsed Days: 0
Plan Fractions Treated to Date: 1
Plan Prescribed Dose Per Fraction: 2 Gy
Plan Total Fractions Prescribed: 30
Plan Total Prescribed Dose: 60 Gy
Reference Point Dosage Given to Date: 2 Gy
Reference Point Session Dosage Given: 2 Gy
Session Number: 1

## 2022-02-19 ENCOUNTER — Other Ambulatory Visit: Payer: Self-pay

## 2022-02-19 ENCOUNTER — Ambulatory Visit
Admission: RE | Admit: 2022-02-19 | Discharge: 2022-02-19 | Disposition: A | Discharge status: Home / Self Care | Payer: Medicare HMO | Source: Ambulatory Visit | Attending: Radiation Oncology | Admitting: Radiation Oncology

## 2022-02-19 DIAGNOSIS — C61 Malignant neoplasm of prostate: Secondary | ICD-10-CM | POA: Diagnosis not present

## 2022-02-19 LAB — RAD ONC ARIA SESSION SUMMARY
Course Elapsed Days: 1
Plan Fractions Treated to Date: 2
Plan Prescribed Dose Per Fraction: 2 Gy
Plan Total Fractions Prescribed: 30
Plan Total Prescribed Dose: 60 Gy
Reference Point Dosage Given to Date: 4 Gy
Reference Point Session Dosage Given: 2 Gy
Session Number: 2

## 2022-02-20 ENCOUNTER — Ambulatory Visit
Admission: RE | Admit: 2022-02-20 | Discharge: 2022-02-20 | Disposition: A | Discharge status: Home / Self Care | Payer: Medicare HMO | Source: Ambulatory Visit | Attending: Radiation Oncology | Admitting: Radiation Oncology

## 2022-02-20 ENCOUNTER — Other Ambulatory Visit: Payer: Self-pay

## 2022-02-20 DIAGNOSIS — C61 Malignant neoplasm of prostate: Secondary | ICD-10-CM | POA: Diagnosis not present

## 2022-02-20 LAB — RAD ONC ARIA SESSION SUMMARY
Course Elapsed Days: 2
Plan Fractions Treated to Date: 3
Plan Prescribed Dose Per Fraction: 2 Gy
Plan Total Fractions Prescribed: 30
Plan Total Prescribed Dose: 60 Gy
Reference Point Dosage Given to Date: 6 Gy
Reference Point Session Dosage Given: 2 Gy
Session Number: 3

## 2022-02-21 ENCOUNTER — Ambulatory Visit
Admission: RE | Admit: 2022-02-21 | Discharge: 2022-02-21 | Disposition: A | Discharge status: Home / Self Care | Payer: Medicare HMO | Source: Ambulatory Visit | Attending: Radiation Oncology | Admitting: Radiation Oncology

## 2022-02-21 ENCOUNTER — Other Ambulatory Visit: Payer: Self-pay

## 2022-02-21 DIAGNOSIS — C61 Malignant neoplasm of prostate: Secondary | ICD-10-CM | POA: Diagnosis not present

## 2022-02-21 LAB — RAD ONC ARIA SESSION SUMMARY
Course Elapsed Days: 3
Plan Fractions Treated to Date: 4
Plan Prescribed Dose Per Fraction: 2 Gy
Plan Total Fractions Prescribed: 30
Plan Total Prescribed Dose: 60 Gy
Reference Point Dosage Given to Date: 8 Gy
Reference Point Session Dosage Given: 2 Gy
Session Number: 4

## 2022-02-24 ENCOUNTER — Other Ambulatory Visit: Payer: Self-pay

## 2022-02-24 ENCOUNTER — Ambulatory Visit
Admission: RE | Admit: 2022-02-24 | Discharge: 2022-02-24 | Disposition: A | Discharge status: Home / Self Care | Payer: Medicare HMO | Source: Ambulatory Visit | Attending: Radiation Oncology | Admitting: Radiation Oncology

## 2022-02-24 DIAGNOSIS — C61 Malignant neoplasm of prostate: Secondary | ICD-10-CM | POA: Diagnosis not present

## 2022-02-24 LAB — RAD ONC ARIA SESSION SUMMARY
Course Elapsed Days: 6
Plan Fractions Treated to Date: 5
Plan Prescribed Dose Per Fraction: 2 Gy
Plan Total Fractions Prescribed: 30
Plan Total Prescribed Dose: 60 Gy
Reference Point Dosage Given to Date: 10 Gy
Reference Point Session Dosage Given: 2 Gy
Session Number: 5

## 2022-02-25 ENCOUNTER — Ambulatory Visit
Admission: RE | Admit: 2022-02-25 | Discharge: 2022-02-25 | Disposition: A | Discharge status: Home / Self Care | Payer: Medicare HMO | Source: Ambulatory Visit | Attending: Radiation Oncology | Admitting: Radiation Oncology

## 2022-02-25 ENCOUNTER — Other Ambulatory Visit: Payer: Self-pay

## 2022-02-25 DIAGNOSIS — C61 Malignant neoplasm of prostate: Secondary | ICD-10-CM | POA: Diagnosis not present

## 2022-02-25 LAB — RAD ONC ARIA SESSION SUMMARY
Course Elapsed Days: 7
Plan Fractions Treated to Date: 6
Plan Prescribed Dose Per Fraction: 2 Gy
Plan Total Fractions Prescribed: 30
Plan Total Prescribed Dose: 60 Gy
Reference Point Dosage Given to Date: 12 Gy
Reference Point Session Dosage Given: 2 Gy
Session Number: 6

## 2022-02-25 IMAGING — DX DG KNEE COMPLETE 4+V*L*
4 series · 4 of 4 positions shown · non-contrast
Comparison: None.

CLINICAL DATA: Knee pain

EXAM:
LEFT KNEE - COMPLETE 4+ VIEW

[knee ap]
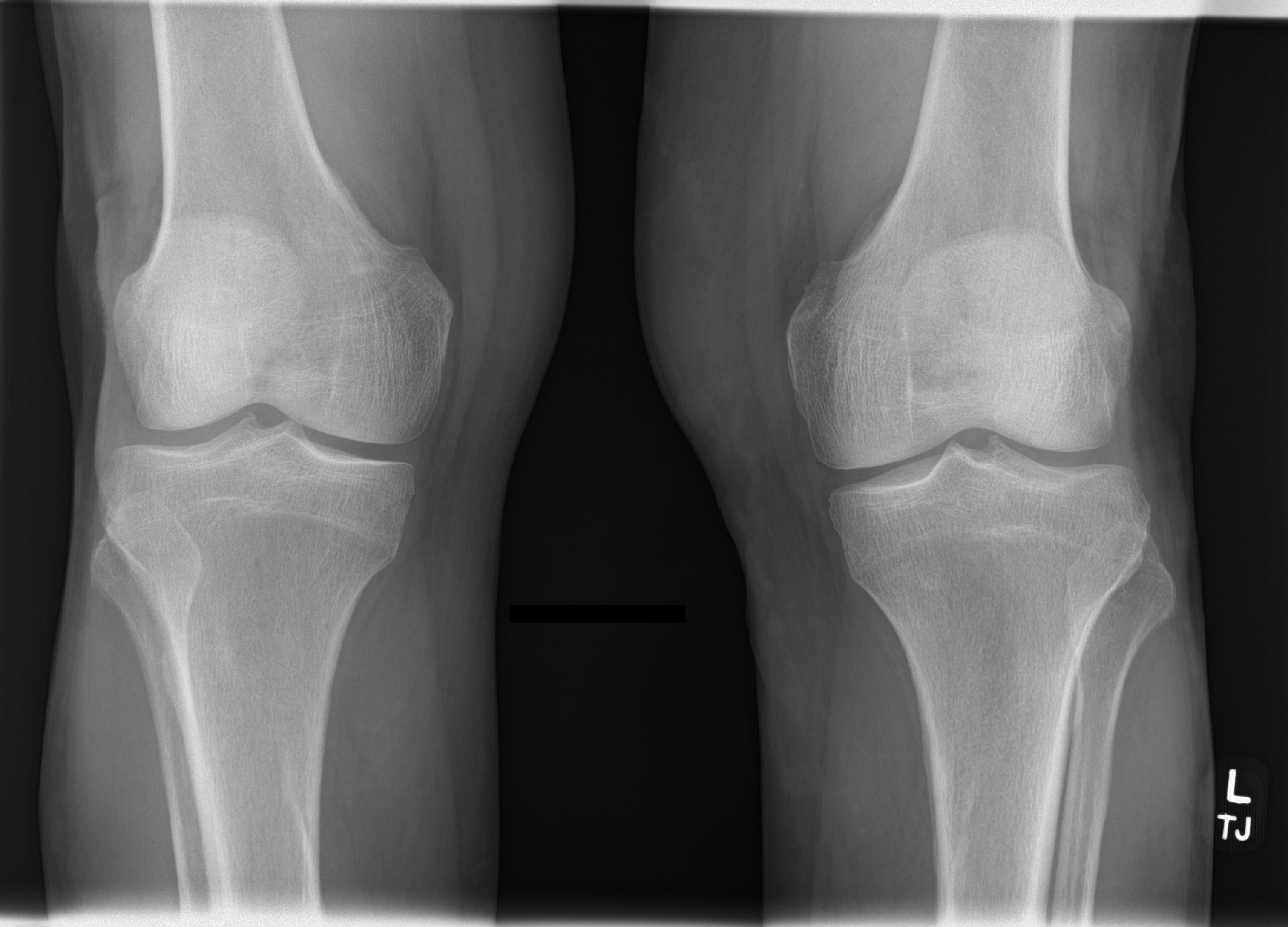

[knee tunnel]
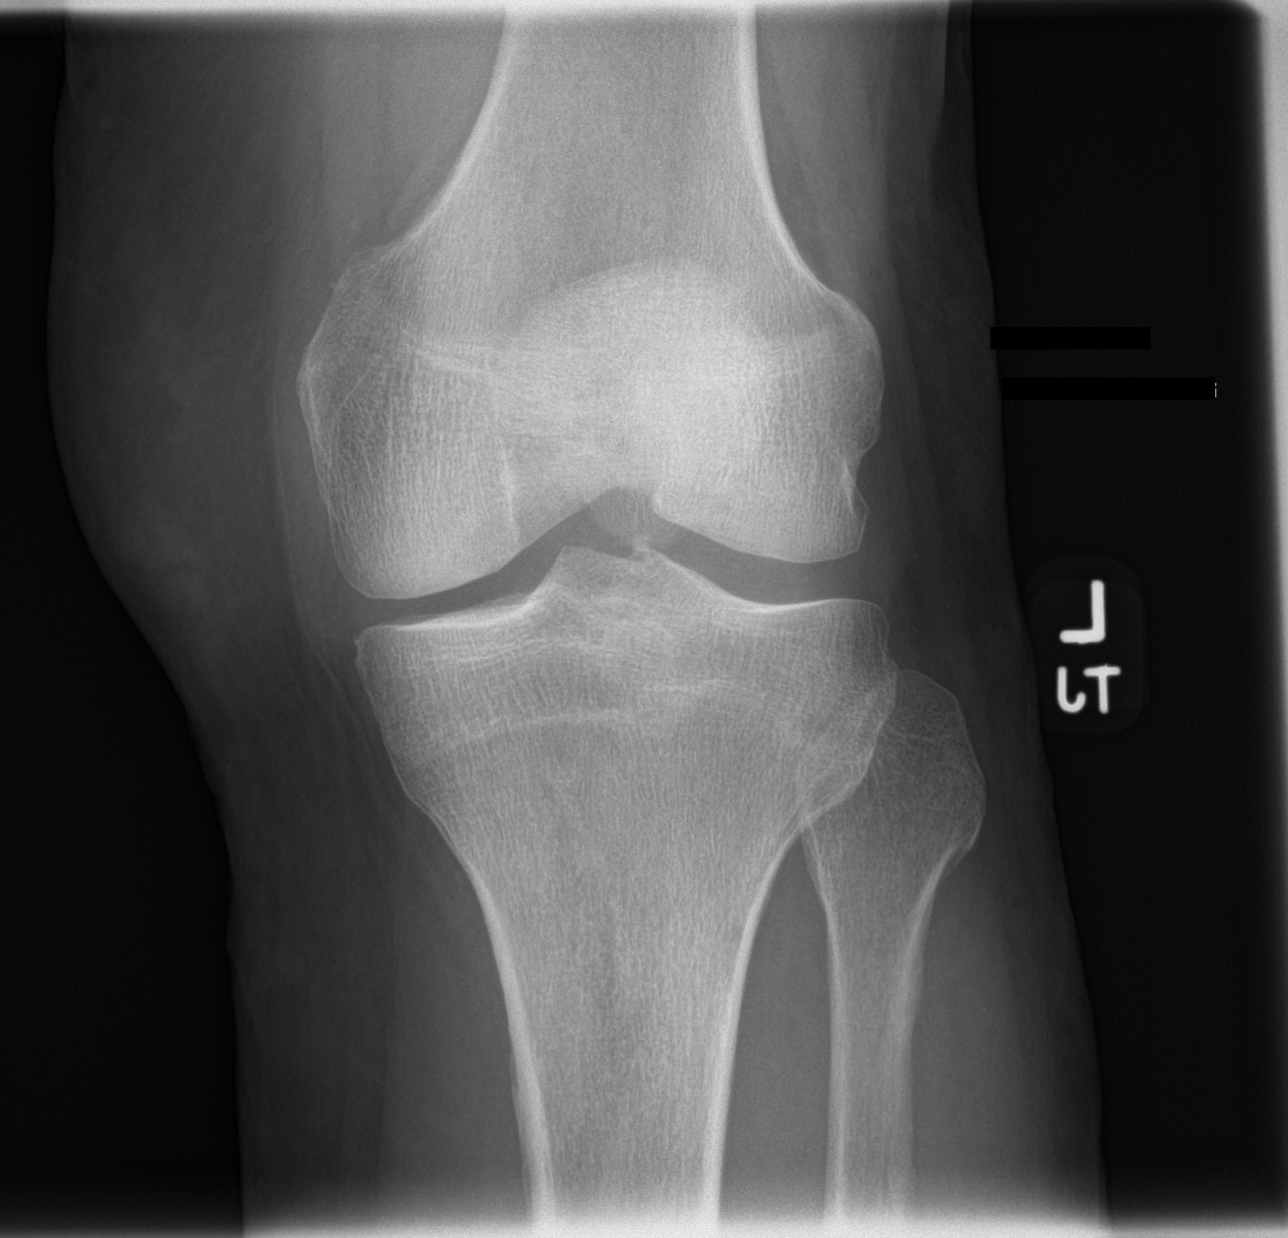

[knee lat]
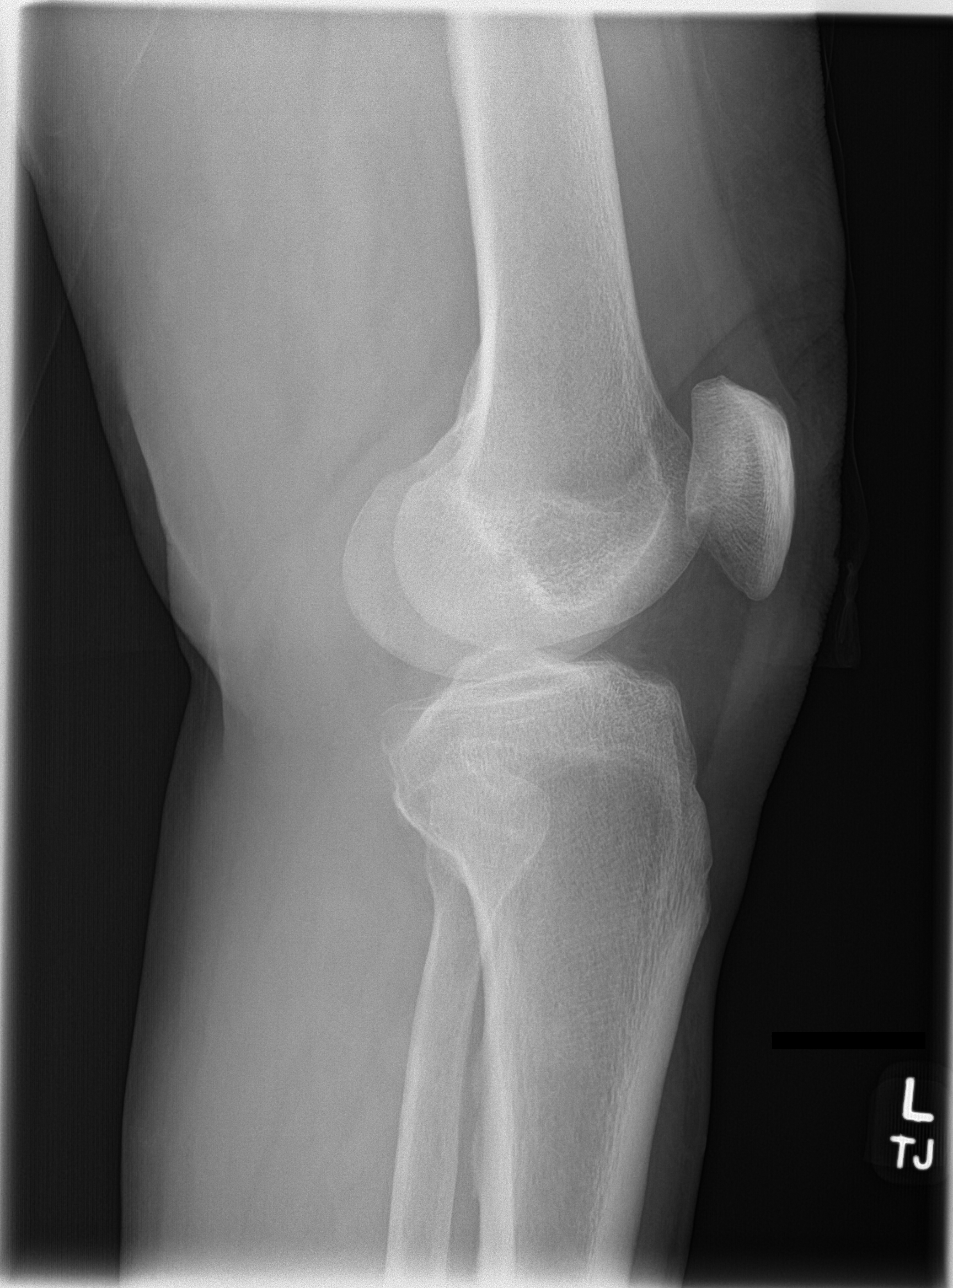

[patella skyline]
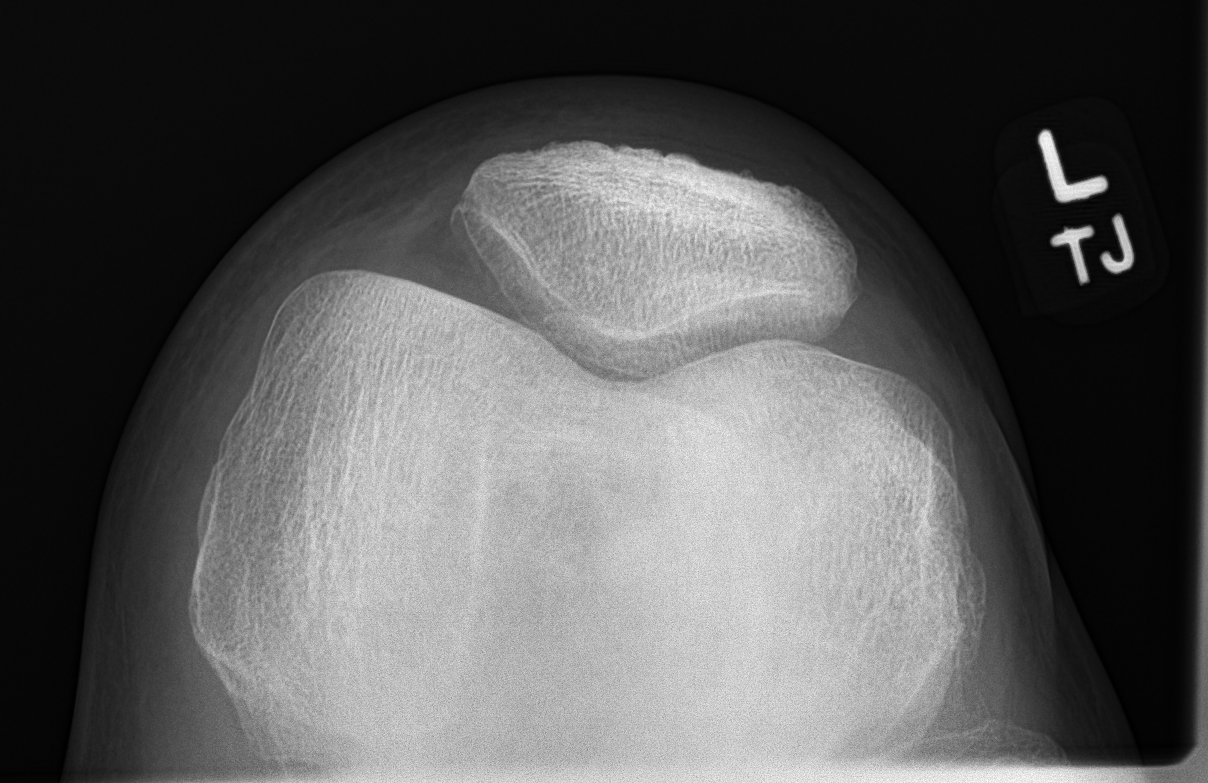

[4 of 4 positions shown; findings below may reference images not displayed]

FINDINGS: No evidence of fracture, dislocation, or joint effusion. No evidence
of arthropathy or other focal bone abnormality. Soft tissues are
unremarkable.
IMPRESSION: Negative.

## 2022-02-26 ENCOUNTER — Other Ambulatory Visit: Payer: Self-pay

## 2022-02-26 ENCOUNTER — Inpatient Hospital Stay: Payer: Medicare HMO | Attending: Oncology

## 2022-02-26 ENCOUNTER — Ambulatory Visit
Admission: RE | Admit: 2022-02-26 | Discharge: 2022-02-26 | Disposition: A | Discharge status: Home / Self Care | Payer: Medicare HMO | Source: Ambulatory Visit | Attending: Radiation Oncology | Admitting: Radiation Oncology

## 2022-02-26 DIAGNOSIS — C61 Malignant neoplasm of prostate: Secondary | ICD-10-CM

## 2022-02-26 LAB — RAD ONC ARIA SESSION SUMMARY
Course Elapsed Days: 8
Plan Fractions Treated to Date: 7
Plan Prescribed Dose Per Fraction: 2 Gy
Plan Total Fractions Prescribed: 30
Plan Total Prescribed Dose: 60 Gy
Reference Point Dosage Given to Date: 14 Gy
Reference Point Session Dosage Given: 2 Gy
Session Number: 7

## 2022-02-26 LAB — CBC
HCT: 41.7 % (ref 39.0–52.0)
Hemoglobin: 13.7 g/dL (ref 13.0–17.0)
MCH: 30.4 pg (ref 26.0–34.0)
MCHC: 32.9 g/dL (ref 30.0–36.0)
MCV: 92.5 fL (ref 80.0–100.0)
Platelets: 201 10*3/uL (ref 150–400)
RBC: 4.51 MIL/uL (ref 4.22–5.81)
RDW: 12.7 % (ref 11.5–15.5)
WBC: 4.4 10*3/uL (ref 4.0–10.5)
nRBC: 0 % (ref 0.0–0.2)

## 2022-02-27 ENCOUNTER — Other Ambulatory Visit: Payer: Self-pay

## 2022-02-27 ENCOUNTER — Ambulatory Visit
Admission: RE | Admit: 2022-02-27 | Discharge: 2022-02-27 | Disposition: A | Discharge status: Home / Self Care | Payer: Medicare HMO | Source: Ambulatory Visit | Attending: Radiation Oncology | Admitting: Radiation Oncology

## 2022-02-27 DIAGNOSIS — C61 Malignant neoplasm of prostate: Secondary | ICD-10-CM | POA: Diagnosis not present

## 2022-02-27 LAB — RAD ONC ARIA SESSION SUMMARY
Course Elapsed Days: 9
Plan Fractions Treated to Date: 8
Plan Prescribed Dose Per Fraction: 2 Gy
Plan Total Fractions Prescribed: 30
Plan Total Prescribed Dose: 60 Gy
Reference Point Dosage Given to Date: 16 Gy
Reference Point Session Dosage Given: 2 Gy
Session Number: 8

## 2022-02-28 ENCOUNTER — Other Ambulatory Visit: Payer: Self-pay

## 2022-02-28 ENCOUNTER — Ambulatory Visit
Admission: RE | Admit: 2022-02-28 | Discharge: 2022-02-28 | Disposition: A | Discharge status: Home / Self Care | Payer: Medicare HMO | Source: Ambulatory Visit | Attending: Radiation Oncology | Admitting: Radiation Oncology

## 2022-02-28 DIAGNOSIS — C61 Malignant neoplasm of prostate: Secondary | ICD-10-CM | POA: Diagnosis not present

## 2022-02-28 LAB — RAD ONC ARIA SESSION SUMMARY
Course Elapsed Days: 10
Plan Fractions Treated to Date: 9
Plan Prescribed Dose Per Fraction: 2 Gy
Plan Total Fractions Prescribed: 30
Plan Total Prescribed Dose: 60 Gy
Reference Point Dosage Given to Date: 18 Gy
Reference Point Session Dosage Given: 2 Gy
Session Number: 9

## 2022-03-03 ENCOUNTER — Other Ambulatory Visit: Payer: Self-pay

## 2022-03-03 ENCOUNTER — Ambulatory Visit
Admission: RE | Admit: 2022-03-03 | Discharge: 2022-03-03 | Disposition: A | Discharge status: Home / Self Care | Payer: Medicare HMO | Source: Ambulatory Visit | Attending: Radiation Oncology | Admitting: Radiation Oncology

## 2022-03-03 DIAGNOSIS — C61 Malignant neoplasm of prostate: Secondary | ICD-10-CM | POA: Diagnosis not present

## 2022-03-03 LAB — RAD ONC ARIA SESSION SUMMARY
Course Elapsed Days: 13
Plan Fractions Treated to Date: 10
Plan Prescribed Dose Per Fraction: 2 Gy
Plan Total Fractions Prescribed: 30
Plan Total Prescribed Dose: 60 Gy
Reference Point Dosage Given to Date: 20 Gy
Reference Point Session Dosage Given: 2 Gy
Session Number: 10

## 2022-03-04 ENCOUNTER — Ambulatory Visit
Admission: RE | Admit: 2022-03-04 | Discharge: 2022-03-04 | Disposition: A | Discharge status: Home / Self Care | Payer: Medicare HMO | Source: Ambulatory Visit | Attending: Radiation Oncology | Admitting: Radiation Oncology

## 2022-03-04 ENCOUNTER — Other Ambulatory Visit: Payer: Self-pay

## 2022-03-04 DIAGNOSIS — C61 Malignant neoplasm of prostate: Secondary | ICD-10-CM | POA: Diagnosis not present

## 2022-03-04 LAB — RAD ONC ARIA SESSION SUMMARY
Course Elapsed Days: 14
Plan Fractions Treated to Date: 11
Plan Prescribed Dose Per Fraction: 2 Gy
Plan Total Fractions Prescribed: 30
Plan Total Prescribed Dose: 60 Gy
Reference Point Dosage Given to Date: 22 Gy
Reference Point Session Dosage Given: 2 Gy
Session Number: 11

## 2022-03-05 ENCOUNTER — Inpatient Hospital Stay: Payer: Medicare HMO

## 2022-03-05 ENCOUNTER — Ambulatory Visit
Admission: RE | Admit: 2022-03-05 | Discharge: 2022-03-05 | Disposition: A | Discharge status: Home / Self Care | Payer: Medicare HMO | Source: Ambulatory Visit | Attending: Radiation Oncology | Admitting: Radiation Oncology

## 2022-03-05 ENCOUNTER — Other Ambulatory Visit: Payer: Self-pay

## 2022-03-05 DIAGNOSIS — C61 Malignant neoplasm of prostate: Secondary | ICD-10-CM

## 2022-03-05 LAB — CBC
HCT: 40.6 % (ref 39.0–52.0)
Hemoglobin: 13.5 g/dL (ref 13.0–17.0)
MCH: 30.5 pg (ref 26.0–34.0)
MCHC: 33.3 g/dL (ref 30.0–36.0)
MCV: 91.9 fL (ref 80.0–100.0)
Platelets: 187 10*3/uL (ref 150–400)
RBC: 4.42 MIL/uL (ref 4.22–5.81)
RDW: 12.8 % (ref 11.5–15.5)
WBC: 4.5 10*3/uL (ref 4.0–10.5)
nRBC: 0 % (ref 0.0–0.2)

## 2022-03-05 LAB — RAD ONC ARIA SESSION SUMMARY
Course Elapsed Days: 15
Plan Fractions Treated to Date: 12
Plan Prescribed Dose Per Fraction: 2 Gy
Plan Total Fractions Prescribed: 30
Plan Total Prescribed Dose: 60 Gy
Reference Point Dosage Given to Date: 24 Gy
Reference Point Session Dosage Given: 2 Gy
Session Number: 12

## 2022-03-06 ENCOUNTER — Other Ambulatory Visit: Payer: Self-pay

## 2022-03-06 ENCOUNTER — Ambulatory Visit
Admission: RE | Admit: 2022-03-06 | Discharge: 2022-03-06 | Disposition: A | Discharge status: Home / Self Care | Payer: Medicare HMO | Source: Ambulatory Visit | Attending: Radiation Oncology | Admitting: Radiation Oncology

## 2022-03-06 DIAGNOSIS — C61 Malignant neoplasm of prostate: Secondary | ICD-10-CM | POA: Diagnosis not present

## 2022-03-06 LAB — RAD ONC ARIA SESSION SUMMARY
Course Elapsed Days: 16
Plan Fractions Treated to Date: 13
Plan Prescribed Dose Per Fraction: 2 Gy
Plan Total Fractions Prescribed: 30
Plan Total Prescribed Dose: 60 Gy
Reference Point Dosage Given to Date: 26 Gy
Reference Point Session Dosage Given: 2 Gy
Session Number: 13

## 2022-03-07 ENCOUNTER — Ambulatory Visit
Admission: RE | Admit: 2022-03-07 | Discharge: 2022-03-07 | Disposition: A | Discharge status: Home / Self Care | Payer: Medicare HMO | Source: Ambulatory Visit | Attending: Radiation Oncology | Admitting: Radiation Oncology

## 2022-03-07 ENCOUNTER — Other Ambulatory Visit: Payer: Self-pay

## 2022-03-07 DIAGNOSIS — C61 Malignant neoplasm of prostate: Secondary | ICD-10-CM | POA: Diagnosis not present

## 2022-03-07 LAB — RAD ONC ARIA SESSION SUMMARY
Course Elapsed Days: 17
Plan Fractions Treated to Date: 14
Plan Prescribed Dose Per Fraction: 2 Gy
Plan Total Fractions Prescribed: 30
Plan Total Prescribed Dose: 60 Gy
Reference Point Dosage Given to Date: 28 Gy
Reference Point Session Dosage Given: 2 Gy
Session Number: 14

## 2022-03-11 ENCOUNTER — Ambulatory Visit
Admission: RE | Admit: 2022-03-11 | Discharge: 2022-03-11 | Disposition: A | Discharge status: Home / Self Care | Payer: Medicare HMO | Source: Ambulatory Visit | Attending: Radiation Oncology | Admitting: Radiation Oncology

## 2022-03-11 ENCOUNTER — Other Ambulatory Visit: Payer: Self-pay

## 2022-03-11 DIAGNOSIS — C61 Malignant neoplasm of prostate: Secondary | ICD-10-CM | POA: Diagnosis not present

## 2022-03-11 LAB — RAD ONC ARIA SESSION SUMMARY
Course Elapsed Days: 21
Plan Fractions Treated to Date: 15
Plan Prescribed Dose Per Fraction: 2 Gy
Plan Total Fractions Prescribed: 30
Plan Total Prescribed Dose: 60 Gy
Reference Point Dosage Given to Date: 30 Gy
Reference Point Session Dosage Given: 2 Gy
Session Number: 15

## 2022-03-12 ENCOUNTER — Ambulatory Visit
Admission: RE | Admit: 2022-03-12 | Discharge: 2022-03-12 | Disposition: A | Discharge status: Home / Self Care | Payer: Medicare HMO | Source: Ambulatory Visit | Attending: Radiation Oncology | Admitting: Radiation Oncology

## 2022-03-12 ENCOUNTER — Other Ambulatory Visit: Payer: Self-pay

## 2022-03-12 ENCOUNTER — Inpatient Hospital Stay: Payer: Medicare HMO

## 2022-03-12 DIAGNOSIS — C61 Malignant neoplasm of prostate: Secondary | ICD-10-CM | POA: Diagnosis not present

## 2022-03-12 LAB — CBC
HCT: 38.5 % — ABNORMAL LOW (ref 39.0–52.0)
Hemoglobin: 12.9 g/dL — ABNORMAL LOW (ref 13.0–17.0)
MCH: 30.7 pg (ref 26.0–34.0)
MCHC: 33.5 g/dL (ref 30.0–36.0)
MCV: 91.7 fL (ref 80.0–100.0)
Platelets: 145 K/uL — ABNORMAL LOW (ref 150–400)
RBC: 4.2 MIL/uL — ABNORMAL LOW (ref 4.22–5.81)
RDW: 12.8 % (ref 11.5–15.5)
WBC: 4.2 K/uL (ref 4.0–10.5)
nRBC: 0 % (ref 0.0–0.2)

## 2022-03-12 LAB — RAD ONC ARIA SESSION SUMMARY
Course Elapsed Days: 22
Plan Fractions Treated to Date: 16
Plan Prescribed Dose Per Fraction: 2 Gy
Plan Total Fractions Prescribed: 30
Plan Total Prescribed Dose: 60 Gy
Reference Point Dosage Given to Date: 32 Gy
Reference Point Session Dosage Given: 2 Gy
Session Number: 16

## 2022-03-12 LAB — PSA: Prostatic Specific Antigen: 0.31 ng/mL (ref 0.00–4.00)

## 2022-03-13 ENCOUNTER — Other Ambulatory Visit: Payer: Self-pay

## 2022-03-13 ENCOUNTER — Ambulatory Visit
Admission: RE | Admit: 2022-03-13 | Discharge: 2022-03-13 | Disposition: A | Discharge status: Home / Self Care | Payer: Medicare HMO | Source: Ambulatory Visit | Attending: Radiation Oncology | Admitting: Radiation Oncology

## 2022-03-13 DIAGNOSIS — C61 Malignant neoplasm of prostate: Secondary | ICD-10-CM | POA: Diagnosis not present

## 2022-03-13 LAB — RAD ONC ARIA SESSION SUMMARY
Course Elapsed Days: 23
Plan Fractions Treated to Date: 17
Plan Prescribed Dose Per Fraction: 2 Gy
Plan Total Fractions Prescribed: 30
Plan Total Prescribed Dose: 60 Gy
Reference Point Dosage Given to Date: 34 Gy
Reference Point Session Dosage Given: 2 Gy
Session Number: 17

## 2022-03-14 ENCOUNTER — Ambulatory Visit
Admission: RE | Admit: 2022-03-14 | Discharge: 2022-03-14 | Disposition: A | Discharge status: Home / Self Care | Payer: Medicare HMO | Source: Ambulatory Visit | Attending: Radiation Oncology | Admitting: Radiation Oncology

## 2022-03-14 ENCOUNTER — Other Ambulatory Visit: Payer: Self-pay

## 2022-03-14 DIAGNOSIS — C61 Malignant neoplasm of prostate: Secondary | ICD-10-CM | POA: Diagnosis not present

## 2022-03-14 LAB — RAD ONC ARIA SESSION SUMMARY
Course Elapsed Days: 24
Plan Fractions Treated to Date: 18
Plan Prescribed Dose Per Fraction: 2 Gy
Plan Total Fractions Prescribed: 30
Plan Total Prescribed Dose: 60 Gy
Reference Point Dosage Given to Date: 36 Gy
Reference Point Session Dosage Given: 2 Gy
Session Number: 18

## 2022-03-18 ENCOUNTER — Other Ambulatory Visit: Payer: Self-pay

## 2022-03-18 ENCOUNTER — Ambulatory Visit
Admission: RE | Admit: 2022-03-18 | Discharge: 2022-03-18 | Disposition: A | Discharge status: Home / Self Care | Payer: Medicare HMO | Source: Ambulatory Visit | Attending: Radiation Oncology | Admitting: Radiation Oncology

## 2022-03-18 DIAGNOSIS — C61 Malignant neoplasm of prostate: Secondary | ICD-10-CM | POA: Insufficient documentation

## 2022-03-18 LAB — RAD ONC ARIA SESSION SUMMARY
Course Elapsed Days: 28
Plan Fractions Treated to Date: 19
Plan Prescribed Dose Per Fraction: 2 Gy
Plan Total Fractions Prescribed: 30
Plan Total Prescribed Dose: 60 Gy
Reference Point Dosage Given to Date: 38 Gy
Reference Point Session Dosage Given: 2 Gy
Session Number: 19

## 2022-03-19 ENCOUNTER — Ambulatory Visit
Admission: RE | Admit: 2022-03-19 | Discharge: 2022-03-19 | Disposition: A | Discharge status: Home / Self Care | Payer: Medicare HMO | Source: Ambulatory Visit | Attending: Radiation Oncology | Admitting: Radiation Oncology

## 2022-03-19 ENCOUNTER — Other Ambulatory Visit: Payer: Self-pay

## 2022-03-19 ENCOUNTER — Inpatient Hospital Stay: Payer: Medicare HMO | Attending: Oncology

## 2022-03-19 DIAGNOSIS — C61 Malignant neoplasm of prostate: Secondary | ICD-10-CM

## 2022-03-19 DIAGNOSIS — Z923 Personal history of irradiation: Secondary | ICD-10-CM | POA: Insufficient documentation

## 2022-03-19 DIAGNOSIS — Z79899 Other long term (current) drug therapy: Secondary | ICD-10-CM | POA: Insufficient documentation

## 2022-03-19 LAB — RAD ONC ARIA SESSION SUMMARY
Course Elapsed Days: 29
Plan Fractions Treated to Date: 20
Plan Prescribed Dose Per Fraction: 2 Gy
Plan Total Fractions Prescribed: 30
Plan Total Prescribed Dose: 60 Gy
Reference Point Dosage Given to Date: 40 Gy
Reference Point Session Dosage Given: 2 Gy
Session Number: 20

## 2022-03-19 LAB — CBC
HCT: 39.3 % (ref 39.0–52.0)
Hemoglobin: 13.3 g/dL (ref 13.0–17.0)
MCH: 30.9 pg (ref 26.0–34.0)
MCHC: 33.8 g/dL (ref 30.0–36.0)
MCV: 91.4 fL (ref 80.0–100.0)
Platelets: 166 10*3/uL (ref 150–400)
RBC: 4.3 MIL/uL (ref 4.22–5.81)
RDW: 13.1 % (ref 11.5–15.5)
WBC: 4.5 10*3/uL (ref 4.0–10.5)
nRBC: 0 % (ref 0.0–0.2)

## 2022-03-20 ENCOUNTER — Other Ambulatory Visit: Payer: Self-pay

## 2022-03-20 ENCOUNTER — Ambulatory Visit
Admission: RE | Admit: 2022-03-20 | Discharge: 2022-03-20 | Disposition: A | Discharge status: Home / Self Care | Payer: Medicare HMO | Source: Ambulatory Visit | Attending: Radiation Oncology | Admitting: Radiation Oncology

## 2022-03-20 DIAGNOSIS — C61 Malignant neoplasm of prostate: Secondary | ICD-10-CM | POA: Diagnosis not present

## 2022-03-20 LAB — RAD ONC ARIA SESSION SUMMARY
Course Elapsed Days: 30
Plan Fractions Treated to Date: 21
Plan Prescribed Dose Per Fraction: 2 Gy
Plan Total Fractions Prescribed: 30
Plan Total Prescribed Dose: 60 Gy
Reference Point Dosage Given to Date: 42 Gy
Reference Point Session Dosage Given: 2 Gy
Session Number: 21

## 2022-03-21 ENCOUNTER — Other Ambulatory Visit: Payer: Self-pay

## 2022-03-21 ENCOUNTER — Ambulatory Visit
Admission: RE | Admit: 2022-03-21 | Discharge: 2022-03-21 | Disposition: A | Discharge status: Home / Self Care | Payer: Medicare HMO | Source: Ambulatory Visit | Attending: Radiation Oncology | Admitting: Radiation Oncology

## 2022-03-21 DIAGNOSIS — C61 Malignant neoplasm of prostate: Secondary | ICD-10-CM | POA: Diagnosis not present

## 2022-03-21 LAB — RAD ONC ARIA SESSION SUMMARY
Course Elapsed Days: 31
Plan Fractions Treated to Date: 22
Plan Prescribed Dose Per Fraction: 2 Gy
Plan Total Fractions Prescribed: 30
Plan Total Prescribed Dose: 60 Gy
Reference Point Dosage Given to Date: 44 Gy
Reference Point Session Dosage Given: 2 Gy
Session Number: 22

## 2022-03-24 ENCOUNTER — Other Ambulatory Visit: Payer: Self-pay

## 2022-03-24 ENCOUNTER — Ambulatory Visit
Admission: RE | Admit: 2022-03-24 | Discharge: 2022-03-24 | Disposition: A | Discharge status: Home / Self Care | Payer: Medicare HMO | Source: Ambulatory Visit | Attending: Radiation Oncology | Admitting: Radiation Oncology

## 2022-03-24 DIAGNOSIS — C61 Malignant neoplasm of prostate: Secondary | ICD-10-CM | POA: Diagnosis not present

## 2022-03-24 LAB — RAD ONC ARIA SESSION SUMMARY
Course Elapsed Days: 34
Plan Fractions Treated to Date: 23
Plan Prescribed Dose Per Fraction: 2 Gy
Plan Total Fractions Prescribed: 30
Plan Total Prescribed Dose: 60 Gy
Reference Point Dosage Given to Date: 46 Gy
Reference Point Session Dosage Given: 2 Gy
Session Number: 23

## 2022-03-25 ENCOUNTER — Ambulatory Visit
Admission: RE | Admit: 2022-03-25 | Discharge: 2022-03-25 | Disposition: A | Discharge status: Home / Self Care | Payer: Medicare HMO | Source: Ambulatory Visit | Attending: Radiation Oncology | Admitting: Radiation Oncology

## 2022-03-25 ENCOUNTER — Other Ambulatory Visit: Payer: Self-pay

## 2022-03-25 DIAGNOSIS — C61 Malignant neoplasm of prostate: Secondary | ICD-10-CM | POA: Diagnosis not present

## 2022-03-25 LAB — RAD ONC ARIA SESSION SUMMARY
Course Elapsed Days: 35
Plan Fractions Treated to Date: 24
Plan Prescribed Dose Per Fraction: 2 Gy
Plan Total Fractions Prescribed: 30
Plan Total Prescribed Dose: 60 Gy
Reference Point Dosage Given to Date: 48 Gy
Reference Point Session Dosage Given: 2 Gy
Session Number: 24

## 2022-03-26 ENCOUNTER — Other Ambulatory Visit: Payer: Self-pay

## 2022-03-26 ENCOUNTER — Inpatient Hospital Stay: Payer: Medicare HMO

## 2022-03-26 ENCOUNTER — Ambulatory Visit
Admission: RE | Admit: 2022-03-26 | Discharge: 2022-03-26 | Disposition: A | Discharge status: Home / Self Care | Payer: Medicare HMO | Source: Ambulatory Visit | Attending: Radiation Oncology | Admitting: Radiation Oncology

## 2022-03-26 DIAGNOSIS — C61 Malignant neoplasm of prostate: Secondary | ICD-10-CM

## 2022-03-26 LAB — CBC
HCT: 40.4 % (ref 39.0–52.0)
Hemoglobin: 13.6 g/dL (ref 13.0–17.0)
MCH: 31.1 pg (ref 26.0–34.0)
MCHC: 33.7 g/dL (ref 30.0–36.0)
MCV: 92.4 fL (ref 80.0–100.0)
Platelets: 187 10*3/uL (ref 150–400)
RBC: 4.37 MIL/uL (ref 4.22–5.81)
RDW: 13 % (ref 11.5–15.5)
WBC: 4.3 10*3/uL (ref 4.0–10.5)
nRBC: 0 % (ref 0.0–0.2)

## 2022-03-26 LAB — RAD ONC ARIA SESSION SUMMARY
Course Elapsed Days: 36
Plan Fractions Treated to Date: 25
Plan Prescribed Dose Per Fraction: 2 Gy
Plan Total Fractions Prescribed: 30
Plan Total Prescribed Dose: 60 Gy
Reference Point Dosage Given to Date: 50 Gy
Reference Point Session Dosage Given: 2 Gy
Session Number: 25

## 2022-03-27 ENCOUNTER — Ambulatory Visit: Payer: Medicare HMO

## 2022-03-28 ENCOUNTER — Ambulatory Visit: Payer: Medicare HMO

## 2022-03-31 ENCOUNTER — Other Ambulatory Visit: Payer: Self-pay

## 2022-03-31 ENCOUNTER — Ambulatory Visit
Admission: RE | Admit: 2022-03-31 | Discharge: 2022-03-31 | Disposition: A | Discharge status: Home / Self Care | Payer: Medicare HMO | Source: Ambulatory Visit | Attending: Radiation Oncology | Admitting: Radiation Oncology

## 2022-03-31 DIAGNOSIS — C61 Malignant neoplasm of prostate: Secondary | ICD-10-CM | POA: Diagnosis not present

## 2022-03-31 LAB — RAD ONC ARIA SESSION SUMMARY
Course Elapsed Days: 41
Plan Fractions Treated to Date: 26
Plan Prescribed Dose Per Fraction: 2 Gy
Plan Total Fractions Prescribed: 30
Plan Total Prescribed Dose: 60 Gy
Reference Point Dosage Given to Date: 52 Gy
Reference Point Session Dosage Given: 2 Gy
Session Number: 26

## 2022-04-01 ENCOUNTER — Ambulatory Visit
Admission: RE | Admit: 2022-04-01 | Discharge: 2022-04-01 | Disposition: A | Discharge status: Home / Self Care | Payer: Medicare HMO | Source: Ambulatory Visit | Attending: Radiation Oncology | Admitting: Radiation Oncology

## 2022-04-01 ENCOUNTER — Other Ambulatory Visit: Payer: Self-pay

## 2022-04-01 DIAGNOSIS — C61 Malignant neoplasm of prostate: Secondary | ICD-10-CM | POA: Diagnosis not present

## 2022-04-01 LAB — RAD ONC ARIA SESSION SUMMARY
Course Elapsed Days: 42
Plan Fractions Treated to Date: 27
Plan Prescribed Dose Per Fraction: 2 Gy
Plan Total Fractions Prescribed: 30
Plan Total Prescribed Dose: 60 Gy
Reference Point Dosage Given to Date: 54 Gy
Reference Point Session Dosage Given: 2 Gy
Session Number: 27

## 2022-04-02 ENCOUNTER — Ambulatory Visit
Admission: RE | Admit: 2022-04-02 | Discharge: 2022-04-02 | Disposition: A | Discharge status: Home / Self Care | Payer: Medicare HMO | Source: Ambulatory Visit | Attending: Radiation Oncology | Admitting: Radiation Oncology

## 2022-04-02 ENCOUNTER — Ambulatory Visit: Payer: Medicare HMO

## 2022-04-02 ENCOUNTER — Other Ambulatory Visit: Payer: Self-pay

## 2022-04-02 DIAGNOSIS — C61 Malignant neoplasm of prostate: Secondary | ICD-10-CM | POA: Diagnosis not present

## 2022-04-02 LAB — RAD ONC ARIA SESSION SUMMARY
Course Elapsed Days: 43
Plan Fractions Treated to Date: 28
Plan Prescribed Dose Per Fraction: 2 Gy
Plan Total Fractions Prescribed: 30
Plan Total Prescribed Dose: 60 Gy
Reference Point Dosage Given to Date: 56 Gy
Reference Point Session Dosage Given: 2 Gy
Session Number: 28

## 2022-04-03 ENCOUNTER — Other Ambulatory Visit: Payer: Self-pay

## 2022-04-03 ENCOUNTER — Ambulatory Visit
Admission: RE | Admit: 2022-04-03 | Discharge: 2022-04-03 | Disposition: A | Discharge status: Home / Self Care | Payer: Medicare HMO | Source: Ambulatory Visit | Attending: Radiation Oncology | Admitting: Radiation Oncology

## 2022-04-03 ENCOUNTER — Ambulatory Visit: Payer: Medicare HMO

## 2022-04-03 DIAGNOSIS — C61 Malignant neoplasm of prostate: Secondary | ICD-10-CM | POA: Diagnosis not present

## 2022-04-03 LAB — RAD ONC ARIA SESSION SUMMARY
Course Elapsed Days: 44
Plan Fractions Treated to Date: 29
Plan Prescribed Dose Per Fraction: 2 Gy
Plan Total Fractions Prescribed: 30
Plan Total Prescribed Dose: 60 Gy
Reference Point Dosage Given to Date: 58 Gy
Reference Point Session Dosage Given: 2 Gy
Session Number: 29

## 2022-04-04 ENCOUNTER — Other Ambulatory Visit: Payer: Self-pay

## 2022-04-04 ENCOUNTER — Ambulatory Visit
Admission: RE | Admit: 2022-04-04 | Discharge: 2022-04-04 | Disposition: A | Discharge status: Home / Self Care | Payer: Medicare HMO | Source: Ambulatory Visit | Attending: Radiation Oncology | Admitting: Radiation Oncology

## 2022-04-04 DIAGNOSIS — C61 Malignant neoplasm of prostate: Secondary | ICD-10-CM | POA: Diagnosis not present

## 2022-04-04 LAB — RAD ONC ARIA SESSION SUMMARY
Course Elapsed Days: 45
Plan Fractions Treated to Date: 30
Plan Prescribed Dose Per Fraction: 2 Gy
Plan Total Fractions Prescribed: 30
Plan Total Prescribed Dose: 60 Gy
Reference Point Dosage Given to Date: 60 Gy
Reference Point Session Dosage Given: 2 Gy
Session Number: 30

## 2022-04-14 ENCOUNTER — Ambulatory Visit: Payer: Self-pay | Admitting: Surgery

## 2022-04-14 ENCOUNTER — Other Ambulatory Visit: Payer: Self-pay

## 2022-04-15 ENCOUNTER — Inpatient Hospital Stay: Payer: Medicare HMO | Admitting: Oncology

## 2022-04-15 ENCOUNTER — Inpatient Hospital Stay: Payer: Medicare HMO

## 2022-04-15 VITALS — BP 151/91 | HR 73 | Temp 97.0°F | Ht 69.5 in | Wt 194.7 lb

## 2022-04-15 DIAGNOSIS — C61 Malignant neoplasm of prostate: Secondary | ICD-10-CM

## 2022-04-15 DIAGNOSIS — Z5181 Encounter for therapeutic drug level monitoring: Secondary | ICD-10-CM

## 2022-04-15 DIAGNOSIS — Z79818 Long term (current) use of other agents affecting estrogen receptors and estrogen levels: Secondary | ICD-10-CM

## 2022-04-15 LAB — COMPREHENSIVE METABOLIC PANEL
ALT: 23 U/L (ref 0–44)
AST: 22 U/L (ref 15–41)
Albumin: 4 g/dL (ref 3.5–5.0)
Alkaline Phosphatase: 45 U/L (ref 38–126)
Anion gap: 7 (ref 5–15)
BUN: 15 mg/dL (ref 8–23)
CO2: 29 mmol/L (ref 22–32)
Calcium: 8.9 mg/dL (ref 8.9–10.3)
Chloride: 103 mmol/L (ref 98–111)
Creatinine, Ser: 1 mg/dL (ref 0.61–1.24)
GFR, Estimated: >60 mL/min (ref >60)
Glucose, Bld: 78 mg/dL (ref 70–99)
Potassium: 4.4 mmol/L (ref 3.5–5.1)
Sodium: 139 mmol/L (ref 135–145)
Total Bilirubin: 0.5 mg/dL (ref 0.3–1.2)
Total Protein: 7.4 g/dL (ref 6.5–8.1)

## 2022-04-15 LAB — CBC WITH DIFFERENTIAL/PLATELET
Abs Immature Granulocytes: 0.01 10*3/uL (ref 0.00–0.07)
Basophils Absolute: 0 10*3/uL (ref 0.0–0.1)
Basophils Relative: 1 %
Eosinophils Absolute: 0.3 10*3/uL (ref 0.0–0.5)
Eosinophils Relative: 7 %
HCT: 40.9 % (ref 39.0–52.0)
Hemoglobin: 13.5 g/dL (ref 13.0–17.0)
Immature Granulocytes: 0 %
Lymphocytes Relative: 17 %
Lymphs Abs: 0.7 10*3/uL (ref 0.7–4.0)
MCH: 30.5 pg (ref 26.0–34.0)
MCHC: 33 g/dL (ref 30.0–36.0)
MCV: 92.5 fL (ref 80.0–100.0)
Monocytes Absolute: 0.6 10*3/uL (ref 0.1–1.0)
Monocytes Relative: 14 %
Neutro Abs: 2.4 10*3/uL (ref 1.7–7.7)
Neutrophils Relative %: 61 %
Platelets: 174 10*3/uL (ref 150–400)
RBC: 4.42 MIL/uL (ref 4.22–5.81)
RDW: 13.1 % (ref 11.5–15.5)
WBC: 3.9 10*3/uL — ABNORMAL LOW (ref 4.0–10.5)
nRBC: 0 % (ref 0.0–0.2)

## 2022-04-15 LAB — PSA: Prostatic Specific Antigen: 0.23 ng/mL (ref 0.00–4.00)

## 2022-04-20 ENCOUNTER — Encounter: Payer: Self-pay | Admitting: Internal Medicine

## 2022-04-20 NOTE — Progress Notes (Signed)
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  Only 1 core was submitted for pathology diagnosis and therefore additional information is not known based on that biopsy specimen.  He received IMRT radiation therapy for the same.  He has not had a prostatectomy.  Patient received 1 dose of Lupron back in April 2020.  He has now been referred to [place] for rising PSA.  Most recent PSA from 12/12/2021 was 1.92.      PSMA PET scan showed no focal activity to localized prostatic carcinoma.  2 very small PSA I would right internal iliac lymph nodes consistent with nodal metastases with an SUV uptake of 16.5.  No evidence of nodal metastases outside of pelvis.  No visceral metastases or skeletal metastases.  Plan was for 2 years of ADT and radiation treatment to the prostate and external iliac lymph nodes  Interval history-patient is tolerating radiation treatment well and completed on 04/04/2022.  He tolerated first dose of Lupron well without any significant side effects other than mild self-limited hot flashes  ECOG PS- 1 Pain scale- 0   Review of systems- Review of Systems  Constitutional:  Negative for chills, fever, malaise/fatigue and weight loss.  HENT:  Negative for congestion, ear discharge and nosebleeds.   Eyes:  Negative for blurred vision.  Respiratory:  Negative for cough, hemoptysis, sputum production, shortness of breath and wheezing.   Cardiovascular:  Negative for  chest pain, palpitations, orthopnea and claudication.  Gastrointestinal:  Negative for abdominal pain, blood in stool, constipation, diarrhea, heartburn, melena, nausea and vomiting.  Genitourinary:  Negative for dysuria, flank pain, frequency, hematuria and urgency.  Musculoskeletal:  Negative for back pain, joint pain and myalgias.  Skin:  Negative for rash.  Neurological:  Negative for dizziness, tingling, focal weakness, seizures, weakness and headaches.  Endo/Heme/Allergies:  Does not bruise/bleed easily.       Hot flashes  Psychiatric/Behavioral:  Negative for depression and suicidal ideas. The patient does not have insomnia.       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     Past Surgical History:  Procedure Laterality Date   CATARACT EXTRACTION, BILATERAL  2015   ESOPHAGOGASTRODUODENOSCOPY (EGD) WITH PROPOFOL N/A 10/09/2017   Surgeon: Jonathon Bellows, MD; Location: [facility] ENDOSCOPY; REPEAT 3 YEARS 10/2020 Barrett's esophagus   TONSILLECTOMY AND ADENOIDECTOMY      Social History   Socioeconomic History   Marital status: Married    Spouse name: Not on file   Number of children: 2   Years of  education: Not on file   Highest education level: Not on file  Occupational History   Occupation: Primary school teacher    Comment: Retired  Tobacco Use   Smoking status: Former    Passive exposure: Past   Smokeless tobacco: Never  Substance and Sexual Activity   Alcohol use: No    Comment: quit 2 years ago with wife's breast cancer   Drug use: Not on file   Sexual activity: Not on  file  Other Topics Concern   Not on file  Social History Narrative   Has living will   Wife, then daughter, should make health care decisions   Would accept resuscitation but no prolonged ventilation   No tube feeds if cognitively unaware   Social Determinants of Health   Financial Resource Strain: Not on file  Food Insecurity: Not on file  Transportation Needs: Not on file  Physical Activity: Not on file  Stress: Not on file  Social Connections: Not on file  Intimate Partner Violence: Not on file    Family History  Problem Relation Age of Onset   Stroke Father    Diabetes Father    Hypertension Sister    Stroke Brother    Esophageal cancer Brother    Stroke Paternal Great-grandfather    Heart disease Neg Hx      Current Outpatient Medications:    aspirin 81 MG chewable tablet, Chew 81 mg by mouth daily., Disp: , Rfl:    ciclopirox (LOPROX) 0.77 % cream, APPLY AS DIRECTED APPLY TO FEET AND IN BETWEEN TOES TWICE DAILY, Disp: 90 g, Rfl: 1   magnesium oxide (MAG-OX) 400 MG tablet, Take 400 mg by mouth daily., Disp: , Rfl:    Multiple Vitamin (MULTI-VITAMINS) TABS, Take 1 tablet by mouth daily., Disp: , Rfl:    omeprazole (PRILOSEC) 20 MG capsule, Take 20 mg by mouth daily., Disp: , Rfl:    tamsulosin (FLOMAX) 0.4 MG CAPS capsule, TAKE 1 CAPSULE(0.4 MG) BY MOUTH DAILY AFTER AND SUPPER, Disp: 30 capsule, Rfl: 11   Zinc 50 MG CAPS, , Disp: , Rfl:    valACYclovir (VALTREX) 1000 MG tablet, Take 2 tablets at first sign of symptoms, repeat in 12 hours for a 1 day dose. Stop Treatment.  Repeat as needed for new cold sores (Patient not taking: Reported on 01/13/2022), Disp: 12 tablet, Rfl: 1  Physical exam:  Vitals:   04/15/22 1337  BP: (!) 151/91  Pulse: 73  Temp: (!) 97 F (36.1 C)  TempSrc: Tympanic  Weight: 194 lb 11.2 oz (88.3 kg)  Height: 5' 9.5" (1.765 m)   Physical Exam Cardiovascular:     Rate and Rhythm: Normal rate and regular rhythm.     Heart sounds: Normal  heart sounds.  Pulmonary:     Effort: Pulmonary effort is normal.     Breath sounds: Normal breath sounds.  Abdominal:     General: Bowel sounds are normal.     Palpations: Abdomen is soft.  Skin:    General: Skin is warm and dry.  Neurological:     Mental Status: He is alert and oriented to person, place, and time.         Latest Ref Rng & Units 04/15/2022    1:01 PM  CMP  Glucose 70 - 99 mg/dL 78   BUN 8 - 23 mg/dL 15   Creatinine 0.61 - 1.24 mg/dL 1.00   Sodium 135 - 145 mmol/L 139   Potassium 3.5 - 5.1 mmol/L 4.4  Chloride 98 - 111 mmol/L 103   CO2 22 - 32 mmol/L 29   Calcium 8.9 - 10.3 mg/dL 8.9   Total Protein 6.5 - 8.1 g/dL 7.4   Total Bilirubin 0.3 - 1.2 mg/dL 0.5   Alkaline Phos 38 - 126 U/L 45   AST 15 - 41 U/L 22   ALT 0 - 44 U/L 23       Latest Ref Rng & Units 04/15/2022    1:01 PM  CBC  WBC 4.0 - 10.5 K/uL 3.9   Hemoglobin 13.0 - 17.0 g/dL 13.5   Hematocrit 39.0 - 52.0 % 40.9   Platelets 150 - 400 K/uL 174      Assessment and plan- Patient is a 82 y.o. 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  He is here for routine follow-up  Patient received his first dose of Lupron in October 2023.  Plan is to do this for 2 years.  Patient has also completed radiation treatment to his prostate as well as external and internal iliac group of lymph nodes.  PSA which was rising continuously and was at 0.8 and June 2023 has come down to 0.23 after starting ADT and radiation.  CBC with differential CMP PSA and see Dr. Janese Banks in 3 months for Lupron.  He will need a bone density scan prior   Visit Diagnosis 1. Prostate cancer ([place])   2. Encounter for monitoring Lupron therapy      Dr. Randa Evens, MD, MPH [facility] at [facility] [phone] 04/20/2022 2:10 PM

## 2022-04-22 ENCOUNTER — Encounter (HOSPITAL_BASED_OUTPATIENT_CLINIC_OR_DEPARTMENT_OTHER): Payer: Self-pay | Admitting: Surgery

## 2022-04-22 ENCOUNTER — Other Ambulatory Visit: Payer: Self-pay

## 2022-04-22 NOTE — Progress Notes (Signed)

## 2022-04-23 NOTE — Progress Notes (Signed)
Per Mickel Baas at [place] she has spoken with Sharyn Lull, Dr.Cornett's assistant and it is ok for pt to continue his ASA for this procedure. [facility] office to inform pt of these instructions.

## 2022-04-29 ENCOUNTER — Encounter (HOSPITAL_BASED_OUTPATIENT_CLINIC_OR_DEPARTMENT_OTHER): Payer: Self-pay | Admitting: Surgery

## 2022-04-29 ENCOUNTER — Other Ambulatory Visit: Payer: Self-pay

## 2022-04-29 ENCOUNTER — Ambulatory Visit (HOSPITAL_BASED_OUTPATIENT_CLINIC_OR_DEPARTMENT_OTHER): Payer: Medicare HMO | Admitting: Anesthesiology

## 2022-04-29 ENCOUNTER — Ambulatory Visit (HOSPITAL_BASED_OUTPATIENT_CLINIC_OR_DEPARTMENT_OTHER)
Admission: RE | Admit: 2022-04-29 | Discharge: 2022-04-29 | Disposition: A | Discharge status: Home / Self Care | Payer: Medicare HMO | Attending: Surgery | Admitting: Surgery

## 2022-04-29 ENCOUNTER — Encounter (HOSPITAL_BASED_OUTPATIENT_CLINIC_OR_DEPARTMENT_OTHER)
Admission: RE | Disposition: A | Discharge status: Home / Self Care | Payer: Self-pay | Source: Home / Self Care | Attending: Surgery

## 2022-04-29 DIAGNOSIS — Z01818 Encounter for other preprocedural examination: Secondary | ICD-10-CM

## 2022-04-29 DIAGNOSIS — K4090 Unilateral inguinal hernia, without obstruction or gangrene, not specified as recurrent: Secondary | ICD-10-CM | POA: Insufficient documentation

## 2022-04-29 DIAGNOSIS — Z7982 Long term (current) use of aspirin: Secondary | ICD-10-CM | POA: Diagnosis not present

## 2022-04-29 DIAGNOSIS — C61 Malignant neoplasm of prostate: Secondary | ICD-10-CM | POA: Insufficient documentation

## 2022-04-29 DIAGNOSIS — Z923 Personal history of irradiation: Secondary | ICD-10-CM | POA: Insufficient documentation

## 2022-04-29 HISTORY — PX: INGUINAL HERNIA REPAIR: SHX194

## 2022-04-29 HISTORY — PX: INSERTION OF MESH: SHX5868

## 2022-04-29 SURGERY — REPAIR, HERNIA, INGUINAL, ADULT
Anesthesia: Regional | Site: Groin | Laterality: Right

## 2022-04-29 MED ORDER — FENTANYL CITRATE (PF) 100 MCG/2ML IJ SOLN
INTRAMUSCULAR | Status: DC | PRN
  Administered 2022-04-29: 50 ug via INTRAVENOUS

## 2022-04-29 MED ORDER — FENTANYL CITRATE (PF) 100 MCG/2ML IJ SOLN
25.0000 ug | INTRAMUSCULAR | Status: DC | PRN
  Administered 2022-04-29: 25 ug via INTRAVENOUS

## 2022-04-29 MED ORDER — EPHEDRINE SULFATE (PRESSORS) 50 MG/ML IJ SOLN
INTRAMUSCULAR | Status: DC | PRN
  Administered 2022-04-29: 10 mg via INTRAVENOUS
  Administered 2022-04-29: 5 mg via INTRAVENOUS

## 2022-04-29 MED ORDER — PHENYLEPHRINE HCL (PRESSORS) 10 MG/ML IV SOLN
INTRAVENOUS | Status: DC | PRN
  Administered 2022-04-29: 80 ug via INTRAVENOUS
  Administered 2022-04-29 (×4): 160 ug via INTRAVENOUS
  Administered 2022-04-29: 80 ug via INTRAVENOUS

## 2022-04-29 MED ORDER — BUPIVACAINE HCL (PF) 0.25 % IJ SOLN
INTRAMUSCULAR | Status: DC | PRN
  Administered 2022-04-29: 10 mL

## 2022-04-29 MED ORDER — ROCURONIUM BROMIDE 10 MG/ML (PF) SYRINGE
PREFILLED_SYRINGE | INTRAVENOUS | Status: AC
  Filled 2022-04-29: qty 10

## 2022-04-29 MED ORDER — CHLORHEXIDINE GLUCONATE CLOTH 2 % EX PADS: 6.0000 | MEDICATED_PAD | Freq: Once | CUTANEOUS | Status: DC

## 2022-04-29 MED ORDER — PROPOFOL 10 MG/ML IV BOLUS
INTRAVENOUS | Status: AC
  Filled 2022-04-29: qty 20

## 2022-04-29 MED ORDER — SUGAMMADEX SODIUM 500 MG/5ML IV SOLN
INTRAVENOUS | Status: AC
  Filled 2022-04-29: qty 5

## 2022-04-29 MED ORDER — CEFAZOLIN SODIUM-DEXTROSE 2-4 GM/100ML-% IV SOLN
2.0000 g | INTRAVENOUS | Status: AC
  Administered 2022-04-29: 2 g via INTRAVENOUS

## 2022-04-29 MED ORDER — PROPOFOL 10 MG/ML IV BOLUS
INTRAVENOUS | Status: DC | PRN
  Administered 2022-04-29: 150 mg via INTRAVENOUS

## 2022-04-29 MED ORDER — LACTATED RINGERS IV SOLN: INTRAVENOUS | Status: DC

## 2022-04-29 MED ORDER — ROPIVACAINE HCL 5 MG/ML IJ SOLN
INTRAMUSCULAR | Status: DC | PRN
  Administered 2022-04-29: 30 mL

## 2022-04-29 MED ORDER — OXYCODONE HCL 5 MG PO TABS: 5.0000 mg | ORAL_TABLET | Freq: Four times a day (QID) | ORAL | 0 refills | Status: DC | PRN

## 2022-04-29 MED ORDER — ACETAMINOPHEN 500 MG PO TABS
1000.0000 mg | ORAL_TABLET | ORAL | Status: AC
  Administered 2022-04-29: 1000 mg via ORAL

## 2022-04-29 MED ORDER — ROCURONIUM BROMIDE 100 MG/10ML IV SOLN
INTRAVENOUS | Status: DC | PRN
  Administered 2022-04-29: 50 mg via INTRAVENOUS

## 2022-04-29 MED ORDER — MIDAZOLAM HCL 2 MG/2ML IJ SOLN
INTRAMUSCULAR | Status: AC
  Filled 2022-04-29: qty 2

## 2022-04-29 MED ORDER — LIDOCAINE 2% (20 MG/ML) 5 ML SYRINGE
INTRAMUSCULAR | Status: AC
  Filled 2022-04-29: qty 5

## 2022-04-29 MED ORDER — CEFAZOLIN SODIUM-DEXTROSE 2-4 GM/100ML-% IV SOLN
INTRAVENOUS | Status: AC
  Filled 2022-04-29: qty 100

## 2022-04-29 MED ORDER — FENTANYL CITRATE (PF) 100 MCG/2ML IJ SOLN
100.0000 ug | Freq: Once | INTRAMUSCULAR | Status: AC
  Administered 2022-04-29: 50 ug via INTRAVENOUS

## 2022-04-29 MED ORDER — FENTANYL CITRATE (PF) 100 MCG/2ML IJ SOLN
INTRAMUSCULAR | Status: AC
  Filled 2022-04-29: qty 2

## 2022-04-29 MED ORDER — ACETAMINOPHEN 500 MG PO TABS
ORAL_TABLET | ORAL | Status: AC
  Filled 2022-04-29: qty 2

## 2022-04-29 MED ORDER — DEXAMETHASONE SODIUM PHOSPHATE 4 MG/ML IJ SOLN
INTRAMUSCULAR | Status: DC | PRN
  Administered 2022-04-29: 5 mg via INTRAVENOUS

## 2022-04-29 MED ORDER — LIDOCAINE HCL (CARDIAC) PF 100 MG/5ML IV SOSY
PREFILLED_SYRINGE | INTRAVENOUS | Status: DC | PRN
  Administered 2022-04-29: 50 mg via INTRAVENOUS

## 2022-04-29 MED ORDER — SUGAMMADEX SODIUM 500 MG/5ML IV SOLN
INTRAVENOUS | Status: DC | PRN
  Administered 2022-04-29: 30 mg via INTRAVENOUS

## 2022-04-29 MED ORDER — ONDANSETRON HCL 4 MG/2ML IJ SOLN
INTRAMUSCULAR | Status: DC | PRN
  Administered 2022-04-29: 4 mg via INTRAVENOUS

## 2022-04-29 SURGICAL SUPPLY — 48 items
ADH SKN CLS APL DERMABOND .7 (GAUZE/BANDAGES/DRESSINGS) ×2
APL PRP STRL LF DISP 70% ISPRP (MISCELLANEOUS) ×2
BLADE CLIPPER SURG (BLADE) IMPLANT
BLADE SURG 15 STRL LF DISP TIS (BLADE) ×2 IMPLANT
BLADE SURG 15 STRL SS (BLADE) ×2
CANISTER SUCT 1200ML W/VALVE (MISCELLANEOUS) IMPLANT
CHLORAPREP W/TINT 26 (MISCELLANEOUS) ×2 IMPLANT
COVER BACK TABLE 60X90IN (DRAPES) ×2 IMPLANT
COVER MAYO STAND STRL (DRAPES) ×2 IMPLANT
DERMABOND ADVANCED .7 DNX12 (GAUZE/BANDAGES/DRESSINGS) ×2 IMPLANT
DRAIN PENROSE .5X12 LATEX STL (DRAIN) ×2 IMPLANT
DRAPE LAPAROTOMY TRNSV 102X78 (DRAPES) ×2 IMPLANT
DRAPE UTILITY XL STRL (DRAPES) ×2 IMPLANT
ELECT COATED BLADE 2.86 ST (ELECTRODE) ×2 IMPLANT
ELECT REM PT RETURN 9FT ADLT (ELECTROSURGICAL) ×2
ELECTRODE REM PT RTRN 9FT ADLT (ELECTROSURGICAL) ×2 IMPLANT
GAUZE 4X4 16PLY ~~LOC~~+RFID DBL (SPONGE) IMPLANT
GAUZE SPONGE 4X4 12PLY STRL LF (GAUZE/BANDAGES/DRESSINGS) IMPLANT
GLOVE BIOGEL PI IND STRL 8 (GLOVE) ×2 IMPLANT
GLOVE ECLIPSE 8.0 STRL XLNG CF (GLOVE) ×2 IMPLANT
GOWN STRL REUS W/ TWL LRG LVL3 (GOWN DISPOSABLE) ×4 IMPLANT
GOWN STRL REUS W/ TWL XL LVL3 (GOWN DISPOSABLE) ×2 IMPLANT
GOWN STRL REUS W/TWL LRG LVL3 (GOWN DISPOSABLE) ×4
GOWN STRL REUS W/TWL XL LVL3 (GOWN DISPOSABLE) ×2
MESH HERNIA SYS ULTRAPRO LRG (Mesh General) IMPLANT
NDL HYPO 25X1 1.5 SAFETY (NEEDLE) ×2 IMPLANT
NEEDLE HYPO 25X1 1.5 SAFETY (NEEDLE) ×2 IMPLANT
NS IRRIG 1000ML POUR BTL (IV SOLUTION) IMPLANT
PACK BASIN DAY SURGERY FS (CUSTOM PROCEDURE TRAY) ×2 IMPLANT
PENCIL SMOKE EVACUATOR (MISCELLANEOUS) ×2 IMPLANT
SLEEVE SCD COMPRESS KNEE MED (STOCKING) ×2 IMPLANT
SPIKE FLUID TRANSFER (MISCELLANEOUS) IMPLANT
SPONGE T-LAP 4X18 ~~LOC~~+RFID (SPONGE) ×2 IMPLANT
STRIP CLOSURE SKIN 1/2X4 (GAUZE/BANDAGES/DRESSINGS) IMPLANT
SUT MON AB 4-0 PC3 18 (SUTURE) ×2 IMPLANT
SUT NOVA 0 T19/GS 22DT (SUTURE) IMPLANT
SUT NOVA NAB DX-16 0-1 5-0 T12 (SUTURE) ×4 IMPLANT
SUT VIC AB 2-0 SH 27 (SUTURE) ×2
SUT VIC AB 2-0 SH 27XBRD (SUTURE) ×2 IMPLANT
SUT VIC AB 3-0 54X BRD REEL (SUTURE) IMPLANT
SUT VIC AB 3-0 BRD 54 (SUTURE)
SUT VICRYL 3-0 CR8 SH (SUTURE) ×2 IMPLANT
SUT VICRYL AB 2 0 TIE (SUTURE) IMPLANT
SUT VICRYL AB 2 0 TIES (SUTURE)
SYR CONTROL 10ML LL (SYRINGE) ×2 IMPLANT
TOWEL GREEN STERILE FF (TOWEL DISPOSABLE) ×2 IMPLANT
TUBE CONNECTING 20X1/4 (TUBING) IMPLANT
YANKAUER SUCT BULB TIP NO VENT (SUCTIONS) IMPLANT

## 2022-04-29 NOTE — Transfer of Care (Signed)
Immediate Anesthesia Transfer of Care Note  Patient: Christian Carlson  Procedure(s) Performed: RIGHT HERNIA REPAIR INGUINAL ADULT WITH MESH (Right: Groin) INSERTION OF MESH (Groin)  Patient Location: PACU  Anesthesia Type:General and Regional  Level of Consciousness: awake, alert , and oriented  Airway & Oxygen Therapy: Patient Spontanous Breathing and Patient connected to face mask oxygen  Post-op Assessment: Report given to RN and Post -op Vital signs reviewed and stable  Post vital signs: Reviewed and stable  Last Vitals:  Vitals Value Taken Time  BP    Temp 36.2 C 04/29/22 1358  Pulse    Resp    SpO2      Last Pain:  Vitals:   04/29/22 1035  TempSrc: Oral  PainSc: 0-No pain         Complications: No notable events documented.

## 2022-04-29 NOTE — Discharge Instructions (Addendum)
[facility] _______[facility]  UMBILICAL OR INGUINAL HERNIA REPAIR: POST OP INSTRUCTIONS  Always review your discharge instruction sheet given to you by the facility where your surgery was performed. IF YOU HAVE DISABILITY OR FAMILY LEAVE FORMS, YOU MUST BRING THEM TO THE OFFICE FOR PROCESSING.   DO NOT GIVE THEM TO YOUR DOCTOR.  1. A  prescription for pain medication may be given to you upon discharge.  Take your pain medication as prescribed, if needed.  If narcotic pain medicine is not needed, then you may take acetaminophen (Tylenol) or ibuprofen (Advil) as needed. 2. Take your usually prescribed medications unless otherwise directed. If you need a refill on your pain medication, please contact your pharmacy.  They will contact our office to request authorization. Prescriptions will not be filled after 5 pm or on week-ends. 3. You should follow a light diet the first 24 hours after arrival home, such as soup and crackers, etc.  Be sure to include lots of fluids daily.  Resume your normal diet the day after surgery. 4.Most patients will experience some swelling and bruising around the umbilicus or in the groin and scrotum.  Ice packs and reclining will help.  Swelling and bruising can take several days to resolve.  6. It is common to experience some constipation if taking pain medication after surgery.  Increasing fluid intake and taking a stool softener (such as Colace) will usually help or prevent this problem from occurring.  A mild laxative (Milk of Magnesia or Miralax) should be taken according to package directions if there are no bowel movements after 48 hours. 7. Unless discharge instructions indicate otherwise, you may remove your bandages 24-48 hours after surgery, and you may shower at that time.  You may have steri-strips (small skin tapes) in place directly over the incision.  These strips should be left on the skin for 7-10 days.  If your surgeon used skin glue on the  incision, you may shower in 24 hours.  The glue will flake off over the next 2-3 weeks.  Any sutures or staples will be removed at the office during your follow-up visit. 8. ACTIVITIES:  You may resume regular (light) daily activities beginning the next day--such as daily self-care, walking, climbing stairs--gradually increasing activities as tolerated.  You may have sexual intercourse when it is comfortable.  Refrain from any heavy lifting or straining until approved by your doctor.  a.You may drive when you are no longer taking prescription pain medication, you can comfortably wear a seatbelt, and you can safely maneuver your car and apply brakes. b.RETURN TO WORK:   _____________________________________________  9.You should see your doctor in the office for a follow-up appointment approximately 2-3 weeks after your surgery.  Make sure that you call for this appointment within a day or two after you arrive home to insure a convenient appointment time. 10.OTHER INSTRUCTIONS: _________________________    _____________________________________  WHEN TO CALL YOUR DOCTOR: Fever over 101.0 Inability to urinate Nausea and/or vomiting Extreme swelling or bruising Continued bleeding from incision. Increased pain, redness, or drainage from the incision  The clinic staff is available to answer your questions during regular business hours.  Please don't hesitate to call and ask to speak to one of the nurses for clinical concerns.  If you have a medical emergency, go to the nearest emergency room or call 911.  A surgeon from [facility] is always on call at the hospital   [address], [address],   [place], [place]  [address] ?  [address], [place], [place]   [phone] ? [phone] ? FAX [phone] Web site: [email or web address]    Post Anesthesia Home Care Instructions  Activity: Get plenty of rest for the remainder of the day. A responsible  individual must stay with you for 24 hours following the procedure.  For the next 24 hours, DO NOT: -Drive a car -Paediatric nurse -Drink alcoholic beverages -Take any medication unless instructed by your physician -Make any legal decisions or sign important papers.  Meals: Start with liquid foods such as gelatin or soup. Progress to regular foods as tolerated. Avoid greasy, spicy, heavy foods. If nausea and/or vomiting occur, drink only clear liquids until the nausea and/or vomiting subsides. Call your physician if vomiting continues.  Special Instructions/Symptoms: Your throat may feel dry or sore from the anesthesia or the breathing tube placed in your throat during surgery. If this causes discomfort, gargle with warm salt water. The discomfort should disappear within 24 hours.  Tylenol can be taken after 4:38 p

## 2022-04-29 NOTE — Interval H&P Note (Signed)
History and Physical Interval Note:  04/29/2022 11:57 AM  Christian Carlson  has presented today for surgery, with the diagnosis of RIGHT INGUINAL HERNIA.  The various methods of treatment have been discussed with the patient and family. After consideration of risks, benefits and other options for treatment, the patient has consented to  Procedure(s) with comments: [place] (Right) - TAP BLOCK as a surgical intervention.  The patient's history has been reviewed, patient examined, no change in status, stable for surgery.  I have reviewed the patient's chart and labs.  Questions were answered to the patient's satisfaction.     [place]

## 2022-04-29 NOTE — Anesthesia Postprocedure Evaluation (Signed)
Anesthesia Post Note  Patient: Tristian Rairigh  Procedure(s) Performed: RIGHT HERNIA REPAIR INGUINAL ADULT WITH MESH (Right: Groin) INSERTION OF MESH (Groin)     Patient location during evaluation: PACU Anesthesia Type: Regional and General Level of consciousness: awake and alert Pain management: pain level controlled Vital Signs Assessment: post-procedure vital signs reviewed and stable Respiratory status: spontaneous breathing, nonlabored ventilation, respiratory function stable and patient connected to nasal cannula oxygen Cardiovascular status: blood pressure returned to baseline and stable Postop Assessment: no apparent nausea or vomiting Anesthetic complications: no   No notable events documented.  Last Vitals:  Vitals:   04/29/22 1445 04/29/22 1454  BP:  (!) 144/98  Pulse: 72 71  Resp: 11   Temp:  (!) 36.3 C  SpO2: 98% 100%    Last Pain:  Vitals:   04/29/22 1454  TempSrc: Oral  PainSc: 3                  Tsuruko Murtha P Corrisa Gibby

## 2022-04-29 NOTE — H&P (Signed)
Chief Complaint: Hernia  History of Present Illness: Christian Carlson is a 82 y.o. male who is seen today as an office consultation for evaluation of Hernia  The patient presents for evaluation of symptomatic right inguinal hernia. It is causing mild discomfort. He has had it for least 2 months. He just finished radiation therapy for prostate cancer. He is done well. Denies nausea vomiting. No change in bowel or bladder function.  Review of Systems: A complete review of systems was obtained from the patient. I have reviewed this information and discussed as appropriate with the patient. See HPI as well for other ROS.    Medical History: Past Medical History:  Diagnosis Date  GERD (gastroesophageal reflux disease)  History of cancer  Sleep apnea   There is no problem list on file for this patient.  Past Surgical History:  Procedure Laterality Date  CATARACT EXTRACTION    No Known Allergies  Current Outpatient Medications on File Prior to Visit  Medication Sig Dispense Refill  omeprazole (PRILOSEC) 20 MG DR capsule Take 20 mg by mouth once daily  tamsulosin (FLOMAX) 0.4 mg capsule TAKE 1 CAPSULE(0.4 MG) BY MOUTH DAILY AFTER AND SUPPER   No current facility-administered medications on file prior to visit.   Family History  Problem Relation Age of Onset  Stroke Father  High blood pressure (Hypertension) Father  Stroke Brother    Social History   Tobacco Use  Smoking Status Former  Types: Cigarettes  Smokeless Tobacco Never    Social History   Socioeconomic History  Marital status: Married  Tobacco Use  Smoking status: Former  Types: Cigarettes  Smokeless tobacco: Never   Objective:   Vitals:  04/14/22 1123  BP: 122/76  Pulse: 77  Weight: 88.7 kg (195 lb 9.6 oz)  Height: 175.3 cm (5' 9"$ )   Body mass index is 28.89 kg/m.  Physical Exam HENT:  Head: Normocephalic.  Cardiovascular:  Rate and Rhythm: Normal rate.  Abdominal:  Palpations: Abdomen is  soft.  Tenderness: There is no abdominal tenderness.  Hernia: A hernia is present. Hernia is present in the right inguinal area. There is no hernia in the left inguinal area.  Comments: Reducible right inguinal hernia.  Musculoskeletal:  Cervical back: Normal range of motion.  Skin: General: Skin is warm.  Neurological:  General: No focal deficit present.  Mental Status: He is alert.     Assessment and Plan:   Diagnoses and all orders for this visit:  Right inguinal hernia  Discussed repair techniques. Recommend open repair of his right inguinal hernia with mesh given his previous prostate radiation therapy. Risks and benefits of hernia surgery reviewed. Risk of bleeding, infection, recurrence, organ injury, bladder injury, cardiovascular risk, postoperative complications, cardiovascular collapse, and the need for the treatment standard procedures reviewed. Patient agrees to proceed with open repair of his right inguinal hernia with mesh   Kennieth Francois, MD

## 2022-04-29 NOTE — Anesthesia Preprocedure Evaluation (Addendum)
Anesthesia Evaluation  Patient identified by MRN, date of birth, ID band Patient awake    Reviewed: Allergy & Precautions, NPO status , Patient's Chart, lab work & pertinent test results  Airway Mallampati: II  TM Distance: >3 FB Neck ROM: Full    Dental no notable dental hx.    Pulmonary sleep apnea , former smoker   Pulmonary exam normal        Cardiovascular negative cardio ROS  Rhythm:Regular Rate:Normal     Neuro/Psych negative neurological ROS  negative psych ROS   GI/Hepatic Neg liver ROS,GERD  Medicated,,Inguinal hernia    Endo/Other  negative endocrine ROS    Renal/GU negative Renal ROS  negative genitourinary   Musculoskeletal negative musculoskeletal ROS (+)    Abdominal Normal abdominal exam  (+)   Peds  Hematology negative hematology ROS (+)   Anesthesia Other Findings   Reproductive/Obstetrics                             Anesthesia Physical Anesthesia Plan  ASA: 2  Anesthesia Plan: General and Regional   Post-op Pain Management: Regional block*   Induction: Intravenous  PONV Risk Score and Plan: 2 and Ondansetron, Dexamethasone and Treatment may vary due to age or medical condition  Airway Management Planned: Mask and LMA  Additional Equipment: None  Intra-op Plan:   Post-operative Plan: Extubation in OR  Informed Consent: I have reviewed the patients History and Physical, chart, labs and discussed the procedure including the risks, benefits and alternatives for the proposed anesthesia with the patient or authorized representative who has indicated his/her understanding and acceptance.     Dental advisory given  Plan Discussed with: CRNA  Anesthesia Plan Comments:        Anesthesia Quick Evaluation

## 2022-04-29 NOTE — Progress Notes (Signed)
Assisted Dr. Jana Half with right, transabdominal plane, ultrasound guided block. Side rails up, monitors on throughout procedure. See vital signs in flow sheet. Tolerated Procedure well.

## 2022-04-29 NOTE — Anesthesia Procedure Notes (Addendum)
Anesthesia Regional Block: TAP block   Pre-Anesthetic Checklist: , timeout performed,  Correct Patient, Correct Site, Correct Laterality,  Correct Procedure, Correct Position, site marked,  Risks and benefits discussed,  Surgical consent,  Pre-op evaluation,  At surgeon's request and post-op pain management  Laterality: Right  Prep: Dura Prep       Needles:  Injection technique: Single-shot  Needle Type: Echogenic Stimulator Needle     Needle Length: 10cm  Needle Gauge: 20     Additional Needles:   Procedures:,,,, ultrasound used (permanent image in chart),,    Narrative:  Start time: 04/29/2022 12:06 PM End time: 04/29/2022 12:08 PM Injection made incrementally with aspirations every 5 mL.  Performed by: Personally  Anesthesiologist: Darral Dash, DO  Additional Notes: Patient identified. Risks/Benefits/Options discussed with patient including but not limited to bleeding, infection, nerve damage, failed block, incomplete pain control. Patient expressed understanding and wished to proceed. All questions were answered. Sterile technique was used throughout the entire procedure. Please see nursing notes for vital signs. Aspirated in 5cc intervals with injection for negative confirmation. Patient was given instructions on fall risk and not to get out of bed. All questions and concerns addressed with instructions to call with any issues or inadequate analgesia.

## 2022-04-29 NOTE — Op Note (Addendum)
Preoperative diagnosis: Right inguinal hernia reducible nonobstructed  Postoperative diagnosis: Same  Procedure: Repair of right inguinal hernia with mesh  Surgeon: Erroll Luna, MD  Anesthesia: General with right TAP block and 0.25% Marcaine local plain  EBL: Minimal  Specimen: None  IV fluids: Per anesthesia record  Indications for procedure: The patient is an 82 year old male who presents for repair of his right inguinal hernia.  Options were discussed the use of mesh reviewed.  After discussion above he agreed to proceed with repair of a symptomatic right inguinal hernia.The risk of hernia repair include bleeding,  Infection,   Recurrence of the hernia,  Mesh use, chronic pain,  Organ injury,  Bowel injury,  Bladder injury,   nerve injury with numbness around the incision,  Death,  and worsening of preexisting  medical problems.  The alternatives to surgery have been discussed as well..  Long term expectations of both operative and non operative treatments have been discussed.   The patient agrees to proceed.   Description of procedure: The patient was met in the holding area and questions were answered.  Right groin was marked as the correct site and a transversus abdominis block was placed by anesthesia on the right.  He was then taken back to the operating room.  He was placed supine upon the OR table.  After induction of general anesthesia, the right groin was prepped and draped in sterile fashion and a timeout performed.  He received appropriate antibiotics.  A oblique incision was made over the right inguinal canal.  Dissection is carried down through Scarpa's fascia and the subcutaneous fat.  Small vessels were controlled with cautery.  The aponeurosis of the external bleak was identified and the external ring was identified.  A small slit incision was made with scalpel and Metzenbaum scissors were used to open the external bleak to expose the anal canal.  Core structures were  encircled with corners Penrose drain.  We then dissected an indirect hernia off the cord structures and reduced a large piece of preperitoneal fat and placed this back into the preperitoneal space.  We then used the ultra pro hernia system and placed the inner leaflet into the space and deployed carefully.  The onlay was placed onto the floor of the inguinal canal.  The ilioinguinal nerve was divided since it was and be tethered to the mesh as it exited the muscle to prevent entrapment.  The mesh was placed onto the floor of the inguinal canal.  A slit was cut for the cord structures.  It was secured to the pubic tubercle, the shelving edge of the inguinal ligament and the internal oblique and conjoined tendon with interrupted #1 Novafil sutures.  The mesh was closed around the cord carefully with #1 Novafil sutures.  There is ample room for the cord to exit without any impingement.  The mesh laid flat without signs of tension and there is no evidence of any ongoing bleeding.  We then closed the external oblique with 2-0 Vicryl carefully over this.  Care was taken not to entrap the underlying structures.  We then closed Scarpa's fascia with interrupted 3-0 Vicryl and the skin with 4-0 Monocryl in a subcuticular stitch.  Dermabond applied.  All counts found to be correct.  The patient was awoke extubated taken to recovery in satisfactory condition.

## 2022-04-29 NOTE — Anesthesia Procedure Notes (Signed)
Procedure Name: Intubation Date/Time: 04/29/2022 12:48 PM  Performed by: Verita Lamb, CRNAPre-anesthesia Checklist: Patient identified, Emergency Drugs available, Suction available and Patient being monitored Patient Re-evaluated:Patient Re-evaluated prior to induction Oxygen Delivery Method: Circle system utilized Preoxygenation: Pre-oxygenation with 100% oxygen Induction Type: IV induction Ventilation: Mask ventilation without difficulty Laryngoscope Size: Mac and 4 Grade View: Grade I Tube type: Oral Tube size: 7.5 mm Number of attempts: 1 Airway Equipment and Method: Stylet and Oral airway Placement Confirmation: ETT inserted through vocal cords under direct vision, positive ETCO2, breath sounds checked- equal and bilateral and CO2 detector Secured at: 23 cm Tube secured with: Tape Dental Injury: Teeth and Oropharynx as per pre-operative assessment

## 2022-04-30 ENCOUNTER — Telehealth: Payer: Self-pay

## 2022-04-30 ENCOUNTER — Encounter (HOSPITAL_BASED_OUTPATIENT_CLINIC_OR_DEPARTMENT_OTHER): Payer: Self-pay | Admitting: Surgery

## 2022-04-30 NOTE — Telephone Encounter (Signed)
Called pt to see how he was doing after inguinal surgery. He said he is doing well. Appreciated the call.

## 2022-05-01 ENCOUNTER — Ambulatory Visit: Payer: Medicare HMO | Admitting: Dermatology

## 2022-05-14 ENCOUNTER — Ambulatory Visit
Admission: RE | Admit: 2022-05-14 | Discharge: 2022-05-14 | Disposition: A | Discharge status: Home / Self Care | Payer: Medicare HMO | Source: Ambulatory Visit | Attending: Radiation Oncology | Admitting: Radiation Oncology

## 2022-05-14 VITALS — BP 131/83 | HR 85 | Temp 97.0°F | Resp 14 | Ht 69.5 in | Wt 197.0 lb

## 2022-05-14 DIAGNOSIS — C61 Malignant neoplasm of prostate: Secondary | ICD-10-CM | POA: Insufficient documentation

## 2022-05-14 NOTE — Progress Notes (Signed)
Radiation Oncology Follow up Note  Name: Christian Carlson   Date:   05/14/2022 MRN:  [id number] DOB: 09-28-1940    This 82 y.o. male presents to the clinic today for 1 month follow-up status post IMRT radiation therapy to his prostate and patient with stage IIa Gleason 7 (3+4) adenocarcinoma presents with a PSA of 11.5.  REFERRING PROVIDER: Venia Carbon, MD  HPI: Patient is an 82 year old male now out 1 month having completed IMRT radiation therapy to his prostate for Gleason 7 adenocarcinoma the prostate.  Seen today in routine follow-up he is doing well.  Occasionally to have some slight dysuria.  Bowel pattern tends to occasionally be constipated.  He had a PSA back at the end of January which was 0000000.  COMPLICATIONS OF TREATMENT: none  FOLLOW UP COMPLIANCE: keeps appointments   PHYSICAL EXAM:  BP 131/83   Pulse 85   Temp (!) 97 F (36.1 C)   Resp 14   Ht 5' 9.5" (1.765 m)   Wt 197 lb (89.4 kg)   BMI 28.67 kg/m  Well-developed well-nourished patient in NAD. HEENT reveals PERLA, EOMI, discs not visualized.  Oral cavity is clear. No oral mucosal lesions are identified. Neck is clear without evidence of cervical or supraclavicular adenopathy. Lungs are clear to A&P. Cardiac examination is essentially unremarkable with regular rate and rhythm without murmur rub or thrill. Abdomen is benign with no organomegaly or masses noted. Motor sensory and DTR levels are equal and symmetric in the upper and lower extremities. Cranial nerves II through XII are grossly intact. Proprioception is intact. No peripheral adenopathy or edema is identified. No motor or sensory levels are noted. Crude visual fields are within normal range.  RADIOLOGY RESULTS: No current films for review  PLAN: Present time patient is doing well with low side effect profile from his IMRT radiation therapy.  And pleased with his overall progress.  Of asked to see him back in 3 months with a repeat PSA.  Patient knows to call  with any concerns.  I would like to take this opportunity to thank you for allowing me to participate in the care of your patient.Noreene Filbert, MD

## 2022-05-21 ENCOUNTER — Ambulatory Visit: Payer: Medicare HMO | Admitting: Dermatology

## 2022-05-21 ENCOUNTER — Encounter: Payer: Self-pay | Admitting: Dermatology

## 2022-05-21 ENCOUNTER — Other Ambulatory Visit: Payer: Self-pay

## 2022-05-21 DIAGNOSIS — L570 Actinic keratosis: Secondary | ICD-10-CM

## 2022-05-21 DIAGNOSIS — Z1283 Encounter for screening for malignant neoplasm of skin: Secondary | ICD-10-CM | POA: Diagnosis not present

## 2022-05-21 DIAGNOSIS — D485 Neoplasm of uncertain behavior of skin: Secondary | ICD-10-CM

## 2022-05-21 DIAGNOSIS — C4492 Squamous cell carcinoma of skin, unspecified: Secondary | ICD-10-CM

## 2022-05-21 DIAGNOSIS — Z85828 Personal history of other malignant neoplasm of skin: Secondary | ICD-10-CM

## 2022-05-21 DIAGNOSIS — C44329 Squamous cell carcinoma of skin of other parts of face: Secondary | ICD-10-CM

## 2022-05-21 DIAGNOSIS — D492 Neoplasm of unspecified behavior of bone, soft tissue, and skin: Secondary | ICD-10-CM

## 2022-05-21 DIAGNOSIS — L739 Follicular disorder, unspecified: Secondary | ICD-10-CM | POA: Diagnosis not present

## 2022-05-21 DIAGNOSIS — L814 Other melanin hyperpigmentation: Secondary | ICD-10-CM

## 2022-05-21 DIAGNOSIS — D229 Melanocytic nevi, unspecified: Secondary | ICD-10-CM

## 2022-05-21 DIAGNOSIS — B353 Tinea pedis: Secondary | ICD-10-CM

## 2022-05-21 DIAGNOSIS — L821 Other seborrheic keratosis: Secondary | ICD-10-CM

## 2022-05-21 DIAGNOSIS — B354 Tinea corporis: Secondary | ICD-10-CM

## 2022-05-21 DIAGNOSIS — L578 Other skin changes due to chronic exposure to nonionizing radiation: Secondary | ICD-10-CM

## 2022-05-21 HISTORY — DX: Squamous cell carcinoma of skin, unspecified: C44.92

## 2022-05-21 MED ORDER — CICLOPIROX OLAMINE 0.77 % EX CREA: TOPICAL_CREAM | CUTANEOUS | 1 refills | Status: AC

## 2022-05-21 MED ORDER — FLUOROURACIL 5 % EX CREA: TOPICAL_CREAM | Freq: Two times a day (BID) | CUTANEOUS | 0 refills | Status: DC

## 2022-05-21 NOTE — Patient Instructions (Addendum)
Start ciclopirox 1-2 times daily to affected area at back, twice daily to feet and in between toes.   Recommend Vitamin D 600 - 1000 iu daily.  Recommend taking Heliocare sun protection supplement daily in sunny weather for additional sun protection. For maximum protection on the sunniest days, you can take up to 2 capsules of regular Heliocare OR take 1 capsule of Heliocare Ultra. For prolonged exposure (such as a full day in the sun), you can repeat your dose of the supplement 4 hours after your first dose. Heliocare can be purchased at [employer], at some [facility] or at [email or web address].    Wound Care Instructions  Cleanse wound gently with soap and water once a day then pat dry with clean gauze. Apply a thin coat of Petrolatum (petroleum jelly, "Vaseline") over the wound (unless you have an allergy to this). We recommend that you use a new, sterile tube of Vaseline. Do not pick or remove scabs. Do not remove the yellow or white "healing tissue" from the base of the wound.  Cover the wound with fresh, clean, nonstick gauze and secure with paper tape. You may use Band-Aids in place of gauze and tape if the wound is small enough, but would recommend trimming much of the tape off as there is often too much. Sometimes Band-Aids can irritate the skin.  You should call the office for your biopsy report after 1 week if you have not already been contacted.  If you experience any problems, such as abnormal amounts of bleeding, swelling, significant bruising, significant pain, or evidence of infection, please call the office immediately.   - Start 5-fluorouracil cream twice a day for 7 days to affected areas including face, 14 days to scalp and right ear.  Reviewed course of treatment and expected reaction.  Patient advised to expect inflammation and crusting and advised that erosions are possible.  Patient advised to be diligent with sun protection during and after treatment. Handout with  details of how to apply medication and what to expect provided. Counseled to keep medication out of reach of children and pets.  Reviewed course of treatment and expected reaction.  Patient advised to expect inflammation and crusting and advised that erosions are possible.  Patient advised to be diligent with sun protection during and after treatment. Handout with details of how to apply medication and what to expect provided. Counseled to keep medication out of reach of children and pets.   Your prescription was sent to [facility] in [place]. A representative from [facility] will contact you within 2 business hours to verify your address and insurance information to schedule a free delivery. If for any reason you do not receive a phone call from them, please reach out to them. Their phone number is [phone] and their hours are Monday-Friday 9:00 am-5:00 pm.    Melanoma ABCDEs  Melanoma is the most dangerous type of skin cancer, and is the leading cause of death from skin disease.  You are more likely to develop melanoma if you: Have light-colored skin, light-colored eyes, or red or blond hair Spend a lot of time in the sun Tan regularly, either outdoors or in a tanning bed Have had blistering sunburns, especially during childhood Have a close family member who has had a melanoma Have atypical moles or large birthmarks  Early detection of melanoma is key since treatment is typically straightforward and cure rates are extremely high if we catch it early.   The first sign of  melanoma is often a change in a mole or a new dark spot.  The ABCDE system is a way of remembering the signs of melanoma.  A for asymmetry:  The two halves do not match. B for border:  The edges of the growth are irregular. C for color:  A mixture of colors are present instead of an even brown color. D for diameter:  Melanomas are usually (but not always) greater than 66m - the size of a pencil eraser. E  for evolution:  The spot keeps changing in size, shape, and color.  Please check your skin once per month between visits. You can use a small mirror in front and a large mirror behind you to keep an eye on the back side or your body.   If you see any new or changing lesions before your next follow-up, please call to schedule a visit.  Please continue daily skin protection including broad spectrum sunscreen SPF 30+ to sun-exposed areas, reapplying every 2 hours as needed when you're outdoors.    Due to recent changes in healthcare laws, you may see results of your pathology and/or laboratory studies on MyChart before the doctors have had a chance to review them. We understand that in some cases there may be results that are confusing or concerning to you. Please understand that not all results are received at the same time and often the doctors may need to interpret multiple results in order to provide you with the best plan of care or course of treatment. Therefore, we ask that you please give u[place]2 business days to thoroughly review all your results before contacting the office for clarification. Should we see a critical lab result, you will be contacted sooner.   If You Need Anything After Your Visit  If you have any questions or concerns for your doctor, please call our main line at [phone] press option 4 to reach your doctor's medical assistant. If no one answers, please leave a voicemail as directed and we will return your call as soon as possible. Messages left after 4 pm will be answered the following business day.   You may also send [place] message via [place] We typically respond to MyChart messages within 1-2 business days.  For prescription refills, please ask your pharmacy to contact our office. Our fax number is [phone]  If you have an urgent issue when the clinic is closed that cannot wait until the next business day, you can page your doctor at the number below.     Please note that while we do our best to be available for urgent issues outside of office hours, we are not available 24/7.   If you have an urgent issue and are unable to reach u[place] you may choose to seek medical care at your doctor's office, retail clinic, urgent care center, or emergency room.  If you have a medical emergency, please immediately call 911 or go to the emergency department.  Pager Numbers  - Dr. KNehemiah Massed [phone] - Dr. MLaurence Ferrari [phone] - Dr. SNicole Kindred [phone] In the event of inclement weather, please call our main line at [phone] an update on the status of any delays or closures.  Dermatology Medication Tips: Please keep the boxes that topical medications come in in order to help keep track of the instructions about where and how to use these. Pharmacies typically print the medication instructions only on the boxes and not directly on the medication tubes.  If your medication is too expensive, please contact our office at [phone] option 4 or send [place] a message through [place].   We are unable to tell what your co-pay for medications will be in advance as this is different depending on your insurance coverage. However, we may be able to find a substitute medication at lower cost or fill out paperwork to get insurance to cover a needed medication.   If a prior authorization is required to get your medication covered by your insurance company, please allow [place] 1-2 business days to complete this process.  Drug prices often vary depending on where the prescription is filled and some pharmacies may offer cheaper prices.  The website www.goodrx.com contains coupons for medications through different pharmacies. The prices here do not account for what the cost may be with help from insurance (it may be cheaper with your insurance), but the website can give you the price if you did not use any insurance.  - You can print the associated coupon and take  it with your prescription to the pharmacy.  - You may also stop by our office during regular business hours and pick up a GoodRx coupon card.  - If you need your prescription sent electronically to a different pharmacy, notify our office through [facility] or by phone at [phone] option 4.     Si Usted Necesita Algo Despus de Su Visita  Tambin puede enviarnos un mensaje a travs de Pharmacist, community. Por lo general respondemos a los mensajes de MyChart en el transcurso de 1 a 2 das hbiles.  Para renovar recetas, por favor pida a su farmacia que se ponga en contacto con nuestra oficina. Harland Dingwall de fax es [place] [phone].  Si tiene un asunto urgente cuando la clnica est cerrada y que no puede esperar hasta el siguiente da hbil, puede llamar/localizar a su doctor(a) al nmero que aparece a continuacin.   Por favor, tenga en cuenta que aunque hacemos todo lo posible para estar disponibles para asuntos urgentes fuera del horario de [place], no estamos disponibles las 24 horas del da, los 7 das de la [place].   Si tiene un problema urgente y no puede comunicarse con nosotros, puede optar por buscar atencin mdica  en el consultorio de su doctor(a), en una clnica privada, en un centro de atencin urgente o en una sala de emergencias.  Si tiene Engineering geologist, por favor llame inmediatamente al 911 o vaya a la sala de emergencias.  Nmeros de bper  - Dr. Nehemiah Massed: [phone]  - Dra. Moye: [phone]  - Dra. Nicole Kindred: [phone]  En caso de inclemencias del [place], por favor llame a Johnsie Kindred principal al [phone] para una actualizacin sobre el [place] de cualquier retraso o cierre.  Consejos para la medicacin en dermatologa: Por favor, guarde las cajas en las que vienen los medicamentos de uso tpico para ayudarle a seguir las instrucciones sobre dnde y cmo usarlos. Las farmacias generalmente imprimen las instrucciones del medicamento slo en las  cajas y no directamente en los tubos del [place].   Si su medicamento es muy caro, por favor, pngase en contacto con Zigmund Daniel llamando al [phone] y presione la opcin 4 o envenos un mensaje a travs de Pharmacist, community.   No podemos decirle cul ser su copago por los medicamentos por adelantado ya que esto es diferente dependiendo de la cobertura de su seguro. Sin embargo, es posible que podamos encontrar un medicamento sustituto a Geneticist, molecular  formulario para que el seguro cubra el medicamento que se considera necesario.   Si se requiere una autorizacin previa para que su compaa de seguros [place] su medicamento, por favor permtanos de 1 a 2 das hbiles para completar este proceso.  Los precios de los medicamentos varan con frecuencia dependiendo del Environmental consultant de dnde se surte la receta y alguna farmacias pueden ofrecer precios ms baratos.  El sitio web www.goodrx.com tiene cupones para medicamentos de Airline pilot. Los precios aqu no tienen en cuenta lo que podra costar con la ayuda del seguro (puede ser ms barato con su seguro), pero el sitio web puede darle el precio si no utiliz Research scientist (physical sciences).  - Puede imprimir el cupn correspondiente y llevarlo con su receta a la farmacia.  - Tambin puede pasar por nuestra oficina durante el horario de atencin regular y Charity fundraiser una tarjeta de cupones de GoodRx.  - Si necesita que su receta se enve electrnicamente a una farmacia diferente, informe a nuestra oficina a travs de MyChart de [place] o por telfono llamando al [phone] y presione la opcin 4.

## 2022-05-21 NOTE — Progress Notes (Addendum)
Follow-Up Visit   Subjective  Christian Carlson is a 82 y.o. male who presents for the following: FBSE (The patient presents for Total-Body Skin Exam (TBSE) for skin cancer screening and mole check.  The patient has spots, moles and lesions to be evaluated, some may be new or changing and the patient has concerns that these could be cancer. Hx SCCis.).   The following portions of the chart were reviewed this encounter and updated as appropriate:   Tobacco  Allergies  Meds  Problems  Med Hx  Surg Hx  Fam Hx      Review of Systems:  No other skin or systemic complaints except as noted in HPI or Assessment and Plan.  Objective  Well appearing patient in no apparent distress; mood and affect are within normal limits.  A full examination was performed including scalp, head, eyes, ears, nose, lips, neck, chest, axillae, abdomen, back, buttocks, bilateral upper extremities, bilateral lower extremities, hands, feet, fingers, toes, fingernails, and toenails. All findings within normal limits unless otherwise noted below.  Left Forehead 0.5 cm pink papule     right temple 0.4 cm pink yellow papule     Left Forearm - Anterior Erythematous thin papules/macules with gritty scale.   B/L feet Scaling and maceration web spaces and over distal and lateral soles.   Mid Back Scaly pink plaque    Assessment & Plan  Neoplasm of uncertain behavior of skin Left Forehead  Skin / nail biopsy Type of biopsy: tangential   Informed consent: discussed and consent obtained   Timeout: patient name, date of birth, surgical site, and procedure verified   Procedure prep:  Patient was prepped and draped in usual sterile fashion Prep type:  Isopropyl alcohol Anesthesia: the lesion was anesthetized in a standard fashion   Anesthetic:  1% lidocaine w/ epinephrine 1-100,000 buffered w/ 8.4% NaHCO3 Instrument used: flexible razor blade   Hemostasis achieved with: aluminum chloride   Outcome:  patient tolerated procedure well   Post-procedure details: wound care instructions given   Additional details:  Petrolatum and a pressure bandage applied  Specimen 1 - Surgical pathology Differential Diagnosis: r/o BCC  Check Margins: No 0.5 cm pink papule  Neoplasm of skin right temple  Skin / nail biopsy Type of biopsy: tangential   Informed consent: discussed and consent obtained   Timeout: patient name, date of birth, surgical site, and procedure verified   Procedure prep:  Patient was prepped and draped in usual sterile fashion Prep type:  Isopropyl alcohol Anesthesia: the lesion was anesthetized in a standard fashion   Anesthetic:  1% lidocaine w/ epinephrine 1-100,000 buffered w/ 8.4% NaHCO3 Instrument used: flexible razor blade   Hemostasis achieved with: aluminum chloride   Outcome: patient tolerated procedure well   Post-procedure details: wound care instructions given   Additional details:  Petrolatum and a pressure bandage applied  Specimen 2 - Surgical pathology Differential Diagnosis: r/o BCC vs Sebaceous Hyperplasia  Check Margins: No 0.4 cm pink yellow papule, 2 pieces  AK (actinic keratosis) Left Forearm - Anterior  Actinic keratoses are precancerous spots that appear secondary to cumulative UV radiation exposure/sun exposure over time. They are chronic with expected duration over 1 year. A portion of actinic keratoses will progress to squamous cell carcinoma of the skin. It is not possible to reliably predict which spots will progress to skin cancer and so treatment is recommended to prevent development of skin cancer.  Recommend daily broad spectrum sunscreen SPF 30+ to sun-exposed areas,  reapply every 2 hours as needed.  Recommend staying in the shade or wearing long sleeves, sun glasses (UVA+UVB protection) and wide brim hats (4-inch brim around the entire circumference of the hat). Call for new or changing lesions.  Prior to procedure, discussed risks  of blister formation, small wound, skin dyspigmentation, or rare scar following cryotherapy. Recommend Vaseline ointment to treated areas while healing.   Destruction of lesion - Left Forearm - Anterior  Destruction method: cryotherapy   Informed consent: discussed and consent obtained   Lesion destroyed using liquid nitrogen: Yes   Cryotherapy cycles:  2 Outcome: patient tolerated procedure well with no complications   Post-procedure details: wound care instructions given    Related Medications fluorouracil (EFUDEX) 5 % cream Apply topically 2 (two) times daily.  Tinea pedis of both feet B/L feet  Clear today. Continue ciclopirox cream weekly for prevention.   With tinea unguium, chronic. Defer treatment.  Related Medications ciclopirox (LOPROX) 0.77 % cream APPLY AS DIRECTED APPLY TO FEET AND IN BETWEEN TOES TWICE DAILY  Tinea corporis Mid Back  + KOH  Start ciclopirox twice daily to affected area at back until clear   History of Squamous Cell Carcinoma in Situ of the Skin - No evidence of recurrence today at left lower back - Recommend regular full body skin exams - Recommend daily broad spectrum sunscreen SPF 30+ to sun-exposed areas, reapply every 2 hours as needed.  - Call if any new or changing lesions are noted between office visits  Lentigines - Scattered tan macules - Due to sun exposure - Benign-appearing, observe - Recommend daily broad spectrum sunscreen SPF 30+ to sun-exposed areas, reapply every 2 hours as needed. - Call for any changes  Seborrheic Keratoses - Stuck-on, waxy, tan-brown papules and/or plaques  - Benign-appearing - Discussed benign etiology and prognosis. - Observe - Call for any changes  Melanocytic Nevi - Tan-brown and/or pink-flesh-colored symmetric macules and papules - Benign appearing on exam today - Observation - Call clinic for new or changing moles - Recommend daily use of broad spectrum spf 30+ sunscreen to  sun-exposed areas.   Hemangiomas - Red papules - Discussed benign nature - Observe - Call for any changes  Actinic Damage with PreCancerous Actinic Keratoses Counseling for Topical Chemotherapy Management: Patient exhibits: - Severe, confluent actinic changes with pre-cancerous actinic keratoses that is secondary to cumulative UV radiation exposure over time - Condition that is severe; chronic, not at goal. - diffuse scaly erythematous macules and papules with underlying dyspigmentation - Discussed Prescription "Field Treatment" topical Chemotherapy for Severe, Chronic Confluent Actinic Changes with Pre-Cancerous Actinic Keratoses Field treatment involves treatment of an entire area of skin that has confluent Actinic Changes (Sun/ Ultraviolet light damage) and PreCancerous Actinic Keratoses by method of PhotoDynamic Therapy (PDT) and/or prescription Topical Chemotherapy agents such as 5-fluorouracil, 5-fluorouracil/calcipotriene, and/or imiquimod.  The purpose is to decrease the number of clinically evident and subclinical PreCancerous lesions to prevent progression to development of skin cancer by chemically destroying early precancer changes that may or may not be visible.  It has been shown to reduce the risk of developing skin cancer in the treated area. As a result of treatment, redness, scaling, crusting, and open sores may occur during treatment course. One or more than one of these methods may be used and may have to be used several times to control, suppress and eliminate the PreCancerous changes. Discussed treatment course, expected reaction, and possible side effects. - Recommend daily broad spectrum sunscreen SPF  30+ to sun-exposed areas, reapply every 2 hours as needed.  - Staying in the shade or wearing long sleeves, sun glasses (UVA+UVB protection) and wide brim hats (4-inch brim around the entire circumference of the hat) are also recommended. - Call for new or changing lesions. -  Start 5-fluorouracil cream twice a day for 7 days to affected areas including forehead and left cheek, 14 days to front of scalp and right ear.  Reviewed course of treatment and expected reaction.  Patient advised to expect inflammation and crusting and advised that erosions are possible.  Patient advised to be diligent with sun protection during and after treatment. Handout with details of how to apply medication and what to expect provided. Counseled to keep medication out of reach of children and pets.  Reviewed course of treatment and expected reaction.  Patient advised to expect inflammation and crusting and advised that erosions are possible.  Patient advised to be diligent with sun protection during and after treatment. Handout with details of how to apply medication and what to expect provided. Counseled to keep medication out of reach of children and pets.   Skin cancer screening performed today.   Return in about 3 months (around 08/21/2022) for AK follow up, 1 year FBSE.  Graciella Belton, RMA, am acting as scribe for Forest Gleason, MD .  Documentation: I have reviewed the above documentation for accuracy and completeness, and I agree with the above.  Forest Gleason, MD

## 2022-05-21 NOTE — Progress Notes (Signed)
Patient called and prefers Fluorouracil Cream to [facility]. aw

## 2022-06-02 ENCOUNTER — Other Ambulatory Visit: Payer: Self-pay

## 2022-06-02 DIAGNOSIS — C4492 Squamous cell carcinoma of skin, unspecified: Secondary | ICD-10-CM

## 2022-06-02 NOTE — Progress Notes (Signed)
SCC L forehead referral to Dr. [facility]/sh

## 2022-06-25 ENCOUNTER — Ambulatory Visit
Admission: RE | Admit: 2022-06-25 | Discharge: 2022-06-25 | Disposition: A | Discharge status: Home / Self Care | Payer: Medicare HMO | Source: Ambulatory Visit | Attending: Oncology | Admitting: Oncology

## 2022-06-25 DIAGNOSIS — Z13820 Encounter for screening for osteoporosis: Secondary | ICD-10-CM | POA: Insufficient documentation

## 2022-06-25 DIAGNOSIS — M85851 Other specified disorders of bone density and structure, right thigh: Secondary | ICD-10-CM | POA: Insufficient documentation

## 2022-06-25 DIAGNOSIS — Z923 Personal history of irradiation: Secondary | ICD-10-CM | POA: Diagnosis not present

## 2022-06-25 DIAGNOSIS — C61 Malignant neoplasm of prostate: Secondary | ICD-10-CM | POA: Insufficient documentation

## 2022-07-01 ENCOUNTER — Telehealth: Payer: Self-pay

## 2022-07-01 NOTE — Telephone Encounter (Signed)
Specimen tracking and history updated from MOH[facility] progress notes. aw 

## 2022-07-03 ENCOUNTER — Ambulatory Visit: Payer: Medicare HMO | Admitting: Radiation Oncology

## 2022-07-15 ENCOUNTER — Encounter: Payer: Self-pay | Admitting: Oncology

## 2022-07-15 ENCOUNTER — Inpatient Hospital Stay: Payer: Medicare HMO | Attending: Oncology

## 2022-07-15 ENCOUNTER — Inpatient Hospital Stay: Payer: Medicare HMO | Admitting: Oncology

## 2022-07-15 ENCOUNTER — Other Ambulatory Visit: Payer: Self-pay | Admitting: *Deleted

## 2022-07-15 ENCOUNTER — Inpatient Hospital Stay: Payer: Medicare HMO

## 2022-07-15 VITALS — BP 129/91 | HR 73 | Temp 96.9°F | Resp 18 | Ht 69.5 in | Wt 199.5 lb

## 2022-07-15 DIAGNOSIS — Z923 Personal history of irradiation: Secondary | ICD-10-CM | POA: Insufficient documentation

## 2022-07-15 DIAGNOSIS — Z85828 Personal history of other malignant neoplasm of skin: Secondary | ICD-10-CM | POA: Insufficient documentation

## 2022-07-15 DIAGNOSIS — Z79899 Other long term (current) drug therapy: Secondary | ICD-10-CM | POA: Diagnosis not present

## 2022-07-15 DIAGNOSIS — M8589 Other specified disorders of bone density and structure, multiple sites: Secondary | ICD-10-CM | POA: Insufficient documentation

## 2022-07-15 DIAGNOSIS — Z5181 Encounter for therapeutic drug level monitoring: Secondary | ICD-10-CM

## 2022-07-15 DIAGNOSIS — M85851 Other specified disorders of bone density and structure, right thigh: Secondary | ICD-10-CM | POA: Diagnosis not present

## 2022-07-15 DIAGNOSIS — Z7982 Long term (current) use of aspirin: Secondary | ICD-10-CM | POA: Diagnosis not present

## 2022-07-15 DIAGNOSIS — R9721 Rising PSA following treatment for malignant neoplasm of prostate: Secondary | ICD-10-CM | POA: Insufficient documentation

## 2022-07-15 DIAGNOSIS — C61 Malignant neoplasm of prostate: Secondary | ICD-10-CM

## 2022-07-15 DIAGNOSIS — Z79818 Long term (current) use of other agents affecting estrogen receptors and estrogen levels: Secondary | ICD-10-CM

## 2022-07-15 LAB — CBC WITH DIFFERENTIAL/PLATELET
Abs Immature Granulocytes: 0.01 10*3/uL (ref 0.00–0.07)
Basophils Absolute: 0 10*3/uL (ref 0.0–0.1)
Basophils Relative: 1 %
Eosinophils Absolute: 0.2 10*3/uL (ref 0.0–0.5)
Eosinophils Relative: 6 %
HCT: 38.8 % — ABNORMAL LOW (ref 39.0–52.0)
Hemoglobin: 12.7 g/dL — ABNORMAL LOW (ref 13.0–17.0)
Immature Granulocytes: 0 %
Lymphocytes Relative: 15 %
Lymphs Abs: 0.6 10*3/uL — ABNORMAL LOW (ref 0.7–4.0)
MCH: 31.1 pg (ref 26.0–34.0)
MCHC: 32.7 g/dL (ref 30.0–36.0)
MCV: 95.1 fL (ref 80.0–100.0)
Monocytes Absolute: 0.5 10*3/uL (ref 0.1–1.0)
Monocytes Relative: 13 %
Neutro Abs: 2.8 10*3/uL (ref 1.7–7.7)
Neutrophils Relative %: 65 %
Platelets: 181 10*3/uL (ref 150–400)
RBC: 4.08 MIL/uL — ABNORMAL LOW (ref 4.22–5.81)
RDW: 12.7 % (ref 11.5–15.5)
WBC: 4.2 10*3/uL (ref 4.0–10.5)
nRBC: 0 % (ref 0.0–0.2)

## 2022-07-15 LAB — COMPREHENSIVE METABOLIC PANEL
ALT: 21 U/L (ref 0–44)
AST: 23 U/L (ref 15–41)
Albumin: 3.9 g/dL (ref 3.5–5.0)
Alkaline Phosphatase: 50 U/L (ref 38–126)
Anion gap: 9 (ref 5–15)
BUN: 16 mg/dL (ref 8–23)
CO2: 26 mmol/L (ref 22–32)
Calcium: 8.8 mg/dL — ABNORMAL LOW (ref 8.9–10.3)
Chloride: 102 mmol/L (ref 98–111)
Creatinine, Ser: 1.02 mg/dL (ref 0.61–1.24)
GFR, Estimated: >60 mL/min (ref >60)
Glucose, Bld: 93 mg/dL (ref 70–99)
Potassium: 4.4 mmol/L (ref 3.5–5.1)
Sodium: 137 mmol/L (ref 135–145)
Total Bilirubin: 0.4 mg/dL (ref 0.3–1.2)
Total Protein: 7 g/dL (ref 6.5–8.1)

## 2022-07-15 LAB — PSA: Prostatic Specific Antigen: 0.08 ng/mL (ref 0.00–4.00)

## 2022-07-15 MED ORDER — LEUPROLIDE ACETATE (6 MONTH) 45 MG ~~LOC~~ KIT
45.0000 mg | PACK | Freq: Once | SUBCUTANEOUS | Status: AC
  Administered 2022-07-15: 45 mg via SUBCUTANEOUS
  Filled 2022-07-15: qty 45

## 2022-07-15 NOTE — Progress Notes (Unsigned)
hold

## 2022-07-15 NOTE — Progress Notes (Signed)
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  Only 1 core was submitted for pathology diagnosis and therefore additional information is not known based on that biopsy specimen.  He received IMRT radiation therapy for the same.  He has not had a prostatectomy.  Patient received 1 dose of Lupron back in April 2020.  He has now been referred to [place] for rising PSA.  Most recent PSA from 12/12/2021 was 1.92.      PSMA PET scan showed no focal activity to localized prostatic carcinoma.  2 very small PSA avid right internal iliac lymph nodes consistent with nodal metastases with an SUV uptake of 16.5.  No evidence of nodal metastases outside of pelvis.  No visceral metastases or skeletal metastases.   Plan completed radiation treatment to the prostate and external iliac lymph nodes.  ADT started in October 2023 with plan to give it for 2 years  Interval history-tolerating Lupron well so far.  Denies any significant side effects.  No recent falls.  ECOG PS- 1 Pain scale- 0   Review of systems- Review of Systems  Constitutional:  Negative for chills, fever, malaise/fatigue and weight loss.  HENT:  Negative for congestion, ear discharge and nosebleeds.   Eyes:  Negative for blurred vision.  Respiratory:  Negative for cough, hemoptysis, sputum production, shortness of breath and  wheezing.   Cardiovascular:  Negative for chest pain, palpitations, orthopnea and claudication.  Gastrointestinal:  Negative for abdominal pain, blood in stool, constipation, diarrhea, heartburn, melena, nausea and vomiting.  Genitourinary:  Negative for dysuria, flank pain, frequency, hematuria and urgency.  Musculoskeletal:  Negative for back pain, joint pain and myalgias.  Skin:  Negative for rash.  Neurological:  Negative for dizziness, tingling, focal weakness, seizures, weakness and headaches.  Endo/Heme/Allergies:  Does not bruise/bleed easily.  Psychiatric/Behavioral:  Negative for depression and suicidal ideas. The patient does not have insomnia.       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     Past Surgical History:  Procedure Laterality Date   CATARACT EXTRACTION, BILATERAL  2015   ESOPHAGOGASTRODUODENOSCOPY (EGD) WITH PROPOFOL N/A 10/09/2017   Surgeon: Wyline Mood, MD; Location: [facility]; REPEAT 3 YEARS 10/2020 Barrett's esophagus   INGUINAL HERNIA REPAIR Right 04/29/2022   Procedure: RIGHT HERNIA REPAIR INGUINAL ADULT WITH MESH;  Surgeon: Harriette Bouillon, MD;  Location: [facility];  Service: General;  Laterality: Right;  TAP BLOCK   INSERTION OF MESH  04/29/2022   Procedure: INSERTION OF MESH;  Surgeon: Harriette Bouillon, MD;  Location: [facility];  Service: General;;   TONSILLECTOMY AND ADENOIDECTOMY      Social History   Socioeconomic History   Marital status:  Married    Spouse name: Not on file   Number of children: 2   Years of education: Not on file   Highest education level: Not on file  Occupational History   Occupation: Production assistant, radio    Comment: Retired  Tobacco Use   Smoking status: Former    Passive exposure: Past   Smokeless tobacco: Never  Substance and Sexual Activity   Alcohol use: No    Comment: quit 2 years ago with wife's breast cancer   Drug use: Never   Sexual activity: Not on file  Other Topics Concern   Not on file  Social History Narrative   Has living will   Wife, then daughter, should make health care decisions   Would accept resuscitation but no prolonged ventilation   No tube feeds if cognitively unaware   Social Determinants of Health   Financial Resource Strain: Not on file  Food Insecurity: Not on file  Transportation Needs: Not on file  Physical Activity: Not on file  Stress: Not on file  Social Connections: Not on file  Intimate Partner Violence: Not on file    Family History  Problem Relation Age of Onset   Stroke Father    Diabetes Father    Hypertension Sister    Stroke Brother    Esophageal cancer Brother    Stroke Paternal Great-grandfather    Heart disease Neg Hx      Current Outpatient Medications:    aspirin 81 MG chewable tablet, Chew 81 mg by mouth daily., Disp: , Rfl:    ciclopirox (LOPROX) 0.77 % cream, APPLY AS DIRECTED APPLY TO FEET AND IN BETWEEN TOES TWICE DAILY, Disp: 90 g, Rfl: 1   fluorouracil (EFUDEX) 5 % cream, Apply topically 2 (two) times daily., Disp: 40 g, Rfl: 0   magnesium oxide (MAG-OX) 400 MG tablet, Take 400 mg by mouth daily., Disp: , Rfl:    Multiple Vitamin (MULTI-VITAMINS) TABS, Take 1 tablet by mouth daily., Disp: , Rfl:    omeprazole (PRILOSEC) 20 MG capsule, Take 20 mg by mouth daily., Disp: , Rfl:    oxyCODONE (OXY IR/ROXICODONE) 5 MG immediate release tablet, Take 1 tablet (5 mg total) by mouth every 6 (six) hours as needed for severe pain.,  Disp: 15 tablet, Rfl: 0   tamsulosin (FLOMAX) 0.4 MG CAPS capsule, TAKE 1 CAPSULE(0.4 MG) BY MOUTH DAILY AFTER AND SUPPER, Disp: 30 capsule, Rfl: 11   valACYclovir (VALTREX) 1000 MG tablet, Take 2 tablets at first sign of symptoms, repeat in 12 hours for a 1 day dose. Stop Treatment.  Repeat as needed for new cold sores, Disp: 12 tablet, Rfl: 1   Zinc 50 MG CAPS, , Disp: , Rfl:   Physical exam:  Vitals:   07/15/22 1325  BP: (!) 129/91  Pulse: 73  Resp: 18  Temp: (!) 96.9 F (36.1 C)  TempSrc: Tympanic  SpO2: 95%  Weight: 199 lb 8 oz (90.5 kg)  Height: 5' 9.5" (1.765 m)   Physical Exam Cardiovascular:     Rate and Rhythm: Normal rate and regular rhythm.     Heart sounds: Normal heart sounds.  Pulmonary:  Effort: Pulmonary effort is normal.     Breath sounds: Normal breath sounds.  Skin:    General: Skin is warm and dry.  Neurological:     Mental Status: He is alert and oriented to person, place, and time.         Latest Ref Rng & Units 07/15/2022   12:52 PM  CMP  Glucose 70 - 99 mg/dL 93   BUN 8 - 23 mg/dL 16   Creatinine 4.09 - 1.24 mg/dL 8.11   Sodium 914 - 782 mmol/L 137   Potassium 3.5 - 5.1 mmol/L 4.4   Chloride 98 - 111 mmol/L 102   CO2 22 - 32 mmol/L 26   Calcium 8.9 - 10.3 mg/dL 8.8   Total Protein 6.5 - 8.1 g/dL 7.0   Total Bilirubin 0.3 - 1.2 mg/dL 0.4   Alkaline Phos 38 - 126 U/L 50   AST 15 - 41 U/L 23   ALT 0 - 44 U/L 21       Latest Ref Rng & Units 07/15/2022   12:52 PM  CBC  WBC 4.0 - 10.5 K/uL 4.2   Hemoglobin 13.0 - 17.0 g/dL 95.6   Hematocrit 21.3 - 52.0 % 38.8   Platelets 150 - 400 K/uL 181     No images are attached to the encounter.  DG Bone Density  Result Date: 06/25/2022 EXAM: DUAL X-RAY ABSORPTIOMETRY (DXA) FOR BONE MINERAL DENSITY IMPRESSION: Dear Dr Smith Robert, Your patient Kwesi Sangha completed a FRAX assessment on 06/25/2022 using the Lunar iDXA DXA System (analysis version: 14.10) manufactured by [employer]. The following  summarizes the results of our evaluation. PATIENT BIOGRAPHICAL: Name: Zayin, Valadez Patient ID: [id number] Birth Date: 24-Oct-1940 Height:    68.0 in. Gender:     [age]      [age]       Weight:    194.0 lbs. Ethnicity:  White                            Exam Date: 06/25/2022 FRAX* RESULTS:  (version: 3.5) 10-year Probability of Fracture1 Major Osteoporotic Fracture2 Hip Fracture 15.2% 10.7% Population: [place] (Caucasian) Risk Factors: Family Hist. (Parent hip fracture) Based on Femur (Right) Neck BMD 1 -The 10-year probability of fracture may be lower than reported if the patient has received treatment. 2 -Major Osteoporotic Fracture: Clinical Spine, Forearm, Hip or Shoulder *FRAX is a Armed forces logistics/support/administrative officer of the [employer] of [employer] for Metabolic Bone Disease, a World Science writer (WHO) [employer]. ASSESSMENT: The probability of a major osteoporotic fracture is 15.2% within the next ten years. The probability of a hip fracture is 10.7% within the next ten years. . Your patient Marqual Mi completed a BMD test on 06/25/2022 using the [employer] DXA System (software version: 14.10) manufactured by [employer]. The following summarizes the results of our evaluation. Technologist: MTB PATIENT BIOGRAPHICAL: Name: Shion, Bluestein Patient ID: [id number] Birth Date: 23-Nov-1940 Height: 68.0 in. Gender: Male Exam Date: 06/25/2022 Weight: 194.0 lbs. Indications: Caucasian, History of Radiation, Parent Hip Fracture, Prostate Cancer, Family Hist. (Parent hip fracture) Fractures: Treatments: Calcium, Vitamin D DENSITOMETRY RESULTS: Site         Region     Measured Date Measured Age WHO Classification Young Adult T-score BMD         %Change vs. Previous Significant Change (*) AP Spine L1-L4 06/25/2021 82.4 Normal 1.4 1.414  g/cm2 - - DualFemur Neck Right 06/25/2022 82.4 Osteopenia -1.4 0.886 g/cm2 - - DualFemur Total Mean 06/25/2022 82.4 Normal -0.4 1.044 g/cm2 - - Left Forearm Radius 33%  06/25/2022 82.4 Normal -0.4 0.948 g/cm2 - - ASSESSMENT: The BMD measured at Femur Neck Right is 0.886 g/cm2 with a T-score of -1.4 is considered moderately low. Fracture risk is moderate. Treatment is advised if there are other risk factors. The scan quality is good. World Science writer [facility]) criteria for post-menopausal, Caucasian Women: Normal:                   T-score at or above -1 SD Osteopenia/low bone mass: T-score between -1 and -2.5 SD Osteoporosis:             T-score at or below -2.5 SD RECOMMENDATIONS: 1. All patients should optimize calcium and vitamin D intake. 2. Consider FDA-approved medical therapies in postmenopausal women and men aged 36 years and older, based on the following: a. A hip or vertebral(clinical or morphometric) fracture b. T-score < -2.5 at the femoral neck or spine after appropriate evaluation to exclude secondary causes c. Low bone mass (T-score between -1.0 and -2.5 at the femoral neck or spine) and a 10-year probability of a hip fracture > 3% or a 10-year probability of a major osteoporosis-related fracture > 20% based on the [place]-adapted WHO algorithm 3. Clinician judgment and/or patient preferences may indicate treatment for people with 10-year fracture probabilities above or below these levels FOLLOW-UP: People with diagnosed cases of osteoporosis or osteopenia should be regularly tested for bone mineral density. For patients eligible for Medicare, routine testing is allowed once every 2 years. The testing frequency can be increased to one year for patients who have rapidly progressing disease, or for those who are receiving medical therapy to restore bone mass. I have reviewed this report, and agree with the above findings. [facility] Electronically Signed   By: Gerome Sam III M.D.   On: 06/25/2022 13:28     Assessment and plan- Patient is a 82 y.o. male history of stage IVa castrate sensitive prostate cancer with local regional lymph node  metastases. He is here for routine f/u   Patient will get his next 25-month dose of Lupron today.  He will continue this up until October 2025.  PSA from today is pending but overall PSA has been trending down.  I discussed the results of bone density scan which shows osteopenia of the right femur neck with a T-score of -1.4.  His 10-year probability of a major osteoporotic fracture was less than 20% but hip fracture was 10% (greater than 3%).  There is a potential for osteopenia to get worse while on ADT.  I have recommended that he should continue taking prophylactic calcium and vitamin D but he would benefit from bisphosphonates as well.  I recommend a yearly Reclast for him.  Discussed risk and benefits of Reclast including all but not limited to possible risk of osteonecrosis of the jaw.  Will obtain dental clearance prior and we will proceed with Reclast in the next 1 to 2 weeks.  I will see him back in 6 months with CBC with differential CMP and PSA   Visit Diagnosis 1. Prostate cancer (HCC)   2. Osteopenia of neck of right femur   3. Encounter for monitoring Lupron therapy      Dr. Owens Shark, MD, MPH [facility] [phone] 07/15/2022 2:44 PM

## 2022-07-22 ENCOUNTER — Other Ambulatory Visit: Payer: Self-pay | Admitting: *Deleted

## 2022-07-22 NOTE — Progress Notes (Signed)
Dental clearance

## 2022-08-07 ENCOUNTER — Inpatient Hospital Stay: Payer: Medicare HMO | Attending: Oncology

## 2022-08-07 DIAGNOSIS — C61 Malignant neoplasm of prostate: Secondary | ICD-10-CM | POA: Diagnosis present

## 2022-08-07 LAB — PSA: Prostatic Specific Antigen: 0.05 ng/mL (ref 0.00–4.00)

## 2022-08-14 ENCOUNTER — Encounter: Payer: Self-pay | Admitting: Radiation Oncology

## 2022-08-14 ENCOUNTER — Ambulatory Visit
Admission: RE | Admit: 2022-08-14 | Discharge: 2022-08-14 | Disposition: A | Discharge status: Home / Self Care | Payer: Medicare HMO | Source: Ambulatory Visit | Attending: Radiation Oncology | Admitting: Radiation Oncology

## 2022-08-14 VITALS — BP 121/79 | HR 74 | Temp 96.5°F | Resp 16 | Ht 69.5 in | Wt 200.5 lb

## 2022-08-14 DIAGNOSIS — C61 Malignant neoplasm of prostate: Secondary | ICD-10-CM | POA: Insufficient documentation

## 2022-08-14 DIAGNOSIS — Z923 Personal history of irradiation: Secondary | ICD-10-CM | POA: Insufficient documentation

## 2022-08-14 DIAGNOSIS — C775 Secondary and unspecified malignant neoplasm of intrapelvic lymph nodes: Secondary | ICD-10-CM | POA: Diagnosis not present

## 2022-08-14 NOTE — Progress Notes (Signed)
.  Radiation Oncology Follow up Note  Name: Christian Carlson   Date:   08/14/2022 MRN:  [id number] DOB: Feb 14, 1941    This 82 y.o. male presents to the clinic today for 58-month follow-up status post IMRT radiation therapy to his prostate with stage IIa Gleason 7 (3+4) adenocarcinoma presenting with a PSA of 11.5..  REFERRING PROVIDER: Karie Schwalbe, MD  HPI: Patient is a 82 year old male now out 4 months having completed IMRT radiation therapy to his prostate for a Gleason 7 adenocarcinoma the prostate.  Seen today in routine follow-up he is doing well specifically denies any increased lower urinary tract symptoms diarrhea or fatigue his most recent PSA is 0.05.  COMPLICATIONS OF TREATMENT: none  FOLLOW UP COMPLIANCE: keeps appointments   PHYSICAL EXAM:  BP 121/79   Pulse 74   Temp (!) 96.5 F (35.8 C)   Resp 16   Ht 5' 9.5" (1.765 m)   Wt 200 lb 8 oz (90.9 kg)   BMI 29.18 kg/m  Well-developed well-nourished patient in NAD. HEENT reveals PERLA, EOMI, discs not visualized.  Oral cavity is clear. No oral mucosal lesions are identified. Neck is clear without evidence of cervical or supraclavicular adenopathy. Lungs are clear to A&P. Cardiac examination is essentially unremarkable with regular rate and rhythm without murmur rub or thrill. Abdomen is benign with no organomegaly or masses noted. Motor sensory and DTR levels are equal and symmetric in the upper and lower extremities. Cranial nerves II through XII are grossly intact. Proprioception is intact. No peripheral adenopathy or edema is identified. No motor or sensory levels are noted. Crude visual fields are within normal range.  RADIOLOGY RESULTS: No current films for review  PLAN: Present time patient is doing well and her excellent biochemical control of his prostate cancer.  I am pleased with his overall progress.  Of asked to see him back in 6 months with repeat PSA.  Patient knows to call with any concerns.  He continues on  Flomax.  I would like to take this opportunity to thank you for allowing me to participate in the care of your patient.Carmina Miller, MD

## 2022-08-21 ENCOUNTER — Ambulatory Visit: Payer: Medicare HMO | Admitting: Dermatology

## 2022-08-28 ENCOUNTER — Other Ambulatory Visit: Payer: Self-pay | Admitting: *Deleted

## 2022-08-28 DIAGNOSIS — C61 Malignant neoplasm of prostate: Secondary | ICD-10-CM

## 2022-10-21 ENCOUNTER — Ambulatory Visit (INDEPENDENT_AMBULATORY_CARE_PROVIDER_SITE_OTHER): Payer: Medicare HMO | Admitting: Dermatology

## 2022-10-21 ENCOUNTER — Encounter: Payer: Self-pay | Admitting: Dermatology

## 2022-10-21 VITALS — BP 144/76 | HR 71

## 2022-10-21 DIAGNOSIS — L570 Actinic keratosis: Secondary | ICD-10-CM | POA: Diagnosis not present

## 2022-10-21 DIAGNOSIS — W908XXA Exposure to other nonionizing radiation, initial encounter: Secondary | ICD-10-CM

## 2022-10-21 DIAGNOSIS — L821 Other seborrheic keratosis: Secondary | ICD-10-CM

## 2022-10-21 DIAGNOSIS — D1801 Hemangioma of skin and subcutaneous tissue: Secondary | ICD-10-CM

## 2022-10-21 DIAGNOSIS — D229 Melanocytic nevi, unspecified: Secondary | ICD-10-CM

## 2022-10-21 DIAGNOSIS — Z85828 Personal history of other malignant neoplasm of skin: Secondary | ICD-10-CM

## 2022-10-21 DIAGNOSIS — L578 Other skin changes due to chronic exposure to nonionizing radiation: Secondary | ICD-10-CM

## 2022-10-21 NOTE — Progress Notes (Signed)
Follow-Up Visit   Subjective  Christian Carlson is a 82 y.o. male who presents for the following: Actinic keratosis follow up.  The patient has spots, moles and lesions to be evaluated, some may be new or changing and the patient may have concern these could be cancer. S/P 5FU/Calcipotriene treatment to forehead and left cheek,  front of scalp and right ear. States areas have improved.  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    The following portions of the chart were reviewed this encounter and updated as appropriate: medications, allergies, medical history  Review of Systems:  No other skin or systemic complaints except as noted in HPI or Assessment and Plan.  Objective  Well appearing patient in no apparent distress; mood and affect are within normal limits.  A focused examination was performed of the following areas: Scalp, face, ears, neck, arms, hands, back.  Relevant exam findings are noted in the Assessment and Plan.  R superior forehead x1, R superior helix x1 (2) Erythematous thin papules/macules with gritty scale.   Lower and mid back  Assessment & Plan   AK (actinic keratosis) (2) R superior forehead x1, R superior helix x1  Actinic keratoses are precancerous spots that appear secondary to cumulative UV radiation exposure/sun exposure over time. They are chronic with expected duration over 1 year. A portion of actinic keratoses will progress to squamous cell carcinoma of the skin. It is not possible to reliably predict which spots will progress to skin cancer and so treatment is recommended to prevent development of skin cancer.  Recommend daily broad spectrum sunscreen SPF 30+ to sun-exposed areas, reapply every 2 hours as needed.  Recommend staying in the shade or wearing long sleeves, sun glasses (UVA+UVB protection) and wide brim hats (4-inch brim around the entire circumference of the hat). Call for new or changing lesions.  Recheck R ear at follow up. Bx if not  resolved.   Destruction of lesion - R superior forehead x1, R superior helix x1 (2) Complexity: simple   Destruction method: cryotherapy   Informed consent: discussed and consent obtained   Timeout:  patient name, date of birth, surgical site, and procedure verified Lesion destroyed using liquid nitrogen: Yes   Region frozen until ice ball extended beyond lesion: Yes   Outcome: patient tolerated procedure well with no complications   Post-procedure details: wound care instructions given   Additional details:  Prior to procedure, discussed risks of blister formation, small wound, skin dyspigmentation, or rare scar following cryotherapy. Recommend Vaseline ointment to treated areas while healing.   Improvement in actinic field damage after field treatment. Patient tolerated well.  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 - No evidence of recurrence today - No lymphadenopathy - Recommend regular full body skin exams - Recommend daily broad spectrum sunscreen SPF 30+ to sun-exposed areas, reapply every 2 hours as needed.  - Call if any new or changing lesions are noted between office visits   HEMANGIOMA Exam: red papule(s) Discussed benign nature. Recommend observation. Call for changes.  MELANOCYTIC NEVI Exam: Tan-brown and/or pink-flesh-colored symmetric macules and papules  Treatment Plan: Benign appearing on exam today. Recommend observation. Call clinic for new or changing moles. Recommend daily use of broad spectrum spf 30+ sunscreen to sun-exposed areas.    ACTINIC DAMAGE - chronic, secondary to cumulative UV radiation exposure/sun exposure over time - diffuse scaly erythematous macules with underlying dyspigmentation - Recommend daily broad  spectrum sunscreen SPF 30+ to sun-exposed areas, reapply every 2 hours as needed.  - Recommend staying in the shade or wearing long sleeves, sun glasses  (UVA+UVB protection) and wide brim hats (4-inch brim around the entire circumference of the hat). - Call for new or changing lesions.   Return for TBSE As Scheduled.  I, Lawson Radar, CMA, am acting as scribe for Elie Goody, MD.   Documentation: I have reviewed the above documentation for accuracy and completeness, and I agree with the above.  Elie Goody, MD

## 2022-10-21 NOTE — Patient Instructions (Addendum)
Cryotherapy Aftercare  Wash gently with soap and water everyday.   Apply Vaseline Jelly daily until healed.    Recommend daily broad spectrum sunscreen SPF 30+ to sun-exposed areas, reapply every 2 hours as needed. Call for new or changing lesions.  Staying in the shade or wearing long sleeves, sun glasses (UVA+UVB protection) and wide brim hats (4-inch brim around the entire circumference of the hat) are also recommended for sun protection.    Due to recent changes in healthcare laws, you may see results of your pathology and/or laboratory studies on MyChart before the doctors have had a chance to review them. We understand that in some cases there may be results that are confusing or concerning to you. Please understand that not all results are received at the same time and often the doctors may need to interpret multiple results in order to provide you with the best plan of care or course of treatment. Therefore, we ask that you please give [place] 2 business days to thoroughly review all your results before contacting the office for clarification. Should we see a critical lab result, you will be contacted sooner.   If You Need Anything After Your Visit  If you have any questions or concerns for your doctor, please call our main line at [phone] and press option 4 to reach your doctor's medical assistant. If no one answers, please leave a voicemail as directed and we will return your call as soon as possible. Messages left after 4 pm will be answered the following business day.   You may also send [place] a message via MyChart. We typically respond to MyChart messages within 1-2 business days.  For prescription refills, please ask your pharmacy to contact our office. Our fax number is [phone].  If you have an urgent issue when the clinic is closed that cannot wait until the next business day, you can page your doctor at the number below.    Please note that while we do our best to be  available for urgent issues outside of office hours, we are not available 24/7.   If you have an urgent issue and are unable to reach [place], you may choose to seek medical care at your doctor's office, retail clinic, urgent care center, or emergency room.  If you have a medical emergency, please immediately call 911 or go to the emergency department.  Pager Numbers  - Dr. Gwen Pounds: [phone]  - Dr. Roseanne Reno: [phone]  In the event of inclement weather, please call our main line at [phone] for an update on the status of any delays or closures.  Dermatology Medication Tips: Please keep the boxes that topical medications come in in order to help keep track of the instructions about where and how to use these. Pharmacies typically print the medication instructions only on the boxes and not directly on the medication tubes.   If your medication is too expensive, please contact our office at [phone] option 4 or send [place] a message through MyChart.   We are unable to tell what your co-pay for medications will be in advance as this is different depending on your insurance coverage. However, we may be able to find a substitute medication at lower cost or fill out paperwork to get insurance to cover a needed medication.   If a prior authorization is required to get your medication covered by your insurance company, please allow [place] 1-2 business days to complete this process.  Drug prices often vary depending on  where the prescription is filled and some pharmacies may offer cheaper prices.  The website www.goodrx.com contains coupons for medications through different pharmacies. The prices here do not account for what the cost may be with help from insurance (it may be cheaper with your insurance), but the website can give you the price if you did not use any insurance.  - You can print the associated coupon and take it with your prescription to the pharmacy.  - You may also stop by our  office during regular business hours and pick up a GoodRx coupon card.  - If you need your prescription sent electronically to a different pharmacy, notify our office through [facility] or by phone at [phone] option 4.     Si Usted Necesita Algo Despus de Su Visita  Tambin puede enviarnos un mensaje a travs de Clinical cytogeneticist. Por lo general respondemos a los mensajes de MyChart en el transcurso de 1 a 2 das hbiles.  Para renovar recetas, por favor pida a su farmacia que se ponga en contacto con nuestra oficina. Annie Sable de fax es [place] [phone].  Si tiene un asunto urgente cuando la clnica est cerrada y que no puede esperar hasta el siguiente da hbil, puede llamar/localizar a su doctor(a) al nmero que aparece a continuacin.   Por favor, tenga en cuenta que aunque hacemos todo lo posible para estar disponibles para asuntos urgentes fuera del horario de [place], no estamos disponibles las 24 horas del da, los 7 [address]  [address] de la [place].   Si tiene un problema urgente y no puede comunicarse con nosotros, puede optar por buscar atencin mdica  en el consultorio de su doctor(a), en una clnica privada, en un centro de atencin urgente o en una sala de emergencias.  Si tiene Engineer, drilling, por favor llame inmediatamente al 911 o vaya a la sala de emergencias.  Nmeros de bper  - Dr. Gwen Pounds: [phone]  - Dra. Roseanne Reno: [phone]  En caso de inclemencias del [place], por favor llame a Lacy Duverney principal al [phone] para una actualizacin sobre el [place] de cualquier retraso o cierre.  Consejos para la medicacin en dermatologa: Por favor, guarde las cajas en las que vienen los medicamentos de uso tpico para ayudarle a seguir las instrucciones sobre dnde y cmo usarlos. Las farmacias generalmente imprimen las instrucciones del medicamento slo en las cajas y no directamente en los tubos del [place].   Si su medicamento es muy caro, por favor,  pngase en contacto con Rolm Gala llamando al [phone] y presione la opcin 4 o envenos un mensaje a travs de Clinical cytogeneticist.   No podemos decirle cul ser su copago por los medicamentos por adelantado ya que esto es diferente dependiendo de la cobertura de su seguro. Sin embargo, es posible que podamos encontrar un medicamento sustituto a Audiological scientist un formulario para que el seguro cubra el medicamento que se considera necesario.   Si se requiere una autorizacin previa para que su compaa de seguros [place] su medicamento, por favor permtanos de 1 a 2 das hbiles para completar [address]  Los precios de los medicamentos varan con frecuencia dependiendo del Environmental consultant de dnde se surte la receta y alguna farmacias pueden ofrecer precios ms baratos.  El sitio web www.goodrx.com tiene cupones para medicamentos de Health and safety inspector. Los precios aqu no tienen en cuenta lo que podra costar con la ayuda del seguro (puede ser ms barato con su seguro), pero el sitio web puede darle el  precio si no Visual merchandiser.  - Puede imprimir el cupn correspondiente y llevarlo con su receta a la farmacia.  - Tambin puede pasar por nuestra oficina durante el horario de atencin regular y Education officer, museum una tarjeta de cupones de GoodRx.  - Si necesita que su receta se enve electrnicamente a una farmacia diferente, informe a nuestra oficina a travs de MyChart de [place] o por telfono llamando al [phone] y presione la opcin 4.

## 2022-11-27 ENCOUNTER — Encounter: Payer: Medicare HMO | Admitting: Dermatology

## 2022-12-01 ENCOUNTER — Encounter: Payer: Self-pay | Admitting: Dermatology

## 2022-12-01 ENCOUNTER — Ambulatory Visit (INDEPENDENT_AMBULATORY_CARE_PROVIDER_SITE_OTHER): Payer: Medicare HMO | Admitting: Dermatology

## 2022-12-01 DIAGNOSIS — C4491 Basal cell carcinoma of skin, unspecified: Secondary | ICD-10-CM

## 2022-12-01 DIAGNOSIS — L814 Other melanin hyperpigmentation: Secondary | ICD-10-CM | POA: Diagnosis not present

## 2022-12-01 DIAGNOSIS — B353 Tinea pedis: Secondary | ICD-10-CM

## 2022-12-01 DIAGNOSIS — D485 Neoplasm of uncertain behavior of skin: Secondary | ICD-10-CM

## 2022-12-01 DIAGNOSIS — Z1283 Encounter for screening for malignant neoplasm of skin: Secondary | ICD-10-CM | POA: Diagnosis not present

## 2022-12-01 DIAGNOSIS — L821 Other seborrheic keratosis: Secondary | ICD-10-CM

## 2022-12-01 DIAGNOSIS — C44212 Basal cell carcinoma of skin of right ear and external auricular canal: Secondary | ICD-10-CM

## 2022-12-01 DIAGNOSIS — W908XXA Exposure to other nonionizing radiation, initial encounter: Secondary | ICD-10-CM | POA: Diagnosis not present

## 2022-12-01 DIAGNOSIS — Z872 Personal history of diseases of the skin and subcutaneous tissue: Secondary | ICD-10-CM

## 2022-12-01 DIAGNOSIS — L578 Other skin changes due to chronic exposure to nonionizing radiation: Secondary | ICD-10-CM

## 2022-12-01 DIAGNOSIS — L570 Actinic keratosis: Secondary | ICD-10-CM | POA: Diagnosis not present

## 2022-12-01 DIAGNOSIS — D1801 Hemangioma of skin and subcutaneous tissue: Secondary | ICD-10-CM

## 2022-12-01 DIAGNOSIS — D229 Melanocytic nevi, unspecified: Secondary | ICD-10-CM

## 2022-12-01 DIAGNOSIS — Z85828 Personal history of other malignant neoplasm of skin: Secondary | ICD-10-CM

## 2022-12-01 HISTORY — DX: Basal cell carcinoma of skin, unspecified: C44.91

## 2022-12-01 NOTE — Addendum Note (Signed)
Addended by: Addison Bailey I on: 12/01/2022 01:41 PM   Modules accepted: Orders

## 2022-12-01 NOTE — Progress Notes (Signed)
Follow-Up Visit   Subjective  Christian Carlson is a 82 y.o. male who presents for the following: Skin Cancer Screening and Full Body Skin Exam  The patient presents for Total-Body Skin Exam (TBSE) for skin cancer screening and mole check. The patient has spots, moles and lesions to be evaluated, some may be new or changing and the patient may have concern these could be cancer.  Patient with hx of SCC, AK's, rosacea. Recheck right superior helix, treated 5 weeks with LN2.   The following portions of the chart were reviewed this encounter and updated as appropriate: medications, allergies, medical history  Review of Systems:  No other skin or systemic complaints except as noted in HPI or Assessment and Plan.  Objective  Well appearing patient in no apparent distress; mood and affect are within normal limits.  A full examination was performed including scalp, head, eyes, ears, nose, lips, neck, chest, axillae, abdomen, back, buttocks, bilateral upper extremities, bilateral lower extremities, hands, feet, fingers, toes, fingernails, and toenails. All findings within normal limits unless otherwise noted below.   Relevant physical exam findings are noted in the Assessment and Plan.  Right superior posterior helix 5 mm papule with telangiectasias      R eyebrow x 1, R sup forehead x 1, R nasal dorsum x 1, L nasal dorsum x 1, L sup helix x 1, L earlobe x 1, vertex scalp x 4 (10) Erythematous thin papules/macules with gritty scale.     Assessment & Plan   SKIN CANCER SCREENING PERFORMED TODAY.  ACTINIC DAMAGE - Chronic condition, secondary to cumulative UV/sun exposure - diffuse scaly erythematous macules with underlying dyspigmentation - Recommend daily broad spectrum sunscreen SPF 30+ to sun-exposed areas, reapply every 2 hours as needed.  - Staying in the shade or wearing long sleeves, sun glasses (UVA+UVB protection) and wide brim hats (4-inch brim around the entire circumference of  the hat) are also recommended for sun protection.  - Call for new or changing lesions.  LENTIGINES, SEBORRHEIC KERATOSES, HEMANGIOMAS - Benign normal skin lesions - Benign-appearing - Call for any changes  MELANOCYTIC NEVI - Tan-brown and/or pink-flesh-colored symmetric macules and papules - Benign appearing on exam today - Observation - Call clinic for new or changing moles - Recommend daily use of broad spectrum spf 30+ sunscreen to sun-exposed areas.   HISTORY OF SQUAMOUS CELL CARCINOMA OF THE SKIN - Left forehead, Mohs 06/09/2022. 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  - No evidence of recurrence today - No lymphadenopathy - Recommend regular full body skin exams - Recommend daily broad spectrum sunscreen SPF 30+ to sun-exposed areas, reapply every 2 hours as needed.  - Call if any new or changing lesions are noted between office visits  HISTORY OF PRECANCEROUS ACTINIC KERATOSIS - site(s) of PreCancerous Actinic Keratosis clear today at right superior helix. - these may recur and new lesions may form requiring treatment to prevent transformation into skin cancer - observe for new or changing spots and contact [facility] for appointment if occur - photoprotection with sun protective clothing; sunglasses and broad spectrum sunscreen with SPF of at least 30 + and frequent self skin exams recommended - yearly exams by a dermatologist recommended for persons with history of PreCancerous Actinic Keratoses  TINEA PEDIS Exam: Scaly plaques of plantar skin of feet.  Chronic and flaring, not at patient goal  Treatment Plan: Continue ciclopirox twice daily to feet and in between toes until clear then once weekly for prevention.  Neoplasm of uncertain behavior of skin Right superior posterior helix  Skin / nail biopsy Type of biopsy: tangential   Informed consent: discussed and consent obtained   Timeout: patient name, date of birth, surgical site, and procedure  verified   Procedure prep:  Patient was prepped and draped in usual sterile fashion Prep type:  Isopropyl alcohol Anesthesia: the lesion was anesthetized in a standard fashion   Anesthetic:  1% lidocaine w/ epinephrine 1-100,000 buffered w/ 8.4% NaHCO3 Instrument used: DermaBlade   Hemostasis achieved with: pressure, aluminum chloride and electrodesiccation   Outcome: patient tolerated procedure well   Post-procedure details: sterile dressing applied and wound care instructions given   Dressing type: bandage and petrolatum    Specimen 1 - Surgical pathology Differential Diagnosis: r/o BCC  Check Margins: No 5 mm papule with telangiectasias  AK (actinic keratosis) (10) R eyebrow x 1, R sup forehead x 1, R nasal dorsum x 1, L nasal dorsum x 1, L sup helix x 1, L earlobe x 1, vertex scalp x 4  Actinic keratoses are precancerous spots that appear secondary to cumulative UV radiation exposure/sun exposure over time. They are chronic with expected duration over 1 year. A portion of actinic keratoses will progress to squamous cell carcinoma of the skin. It is not possible to reliably predict which spots will progress to skin cancer and so treatment is recommended to prevent development of skin cancer.  Recommend daily broad spectrum sunscreen SPF 30+ to sun-exposed areas, reapply every 2 hours as needed.  Recommend staying in the shade or wearing long sleeves, sun glasses (UVA+UVB protection) and wide brim hats (4-inch brim around the entire circumference of the hat). Call for new or changing lesions.   Destruction of lesion - R eyebrow x 1, R sup forehead x 1, R nasal dorsum x 1, L nasal dorsum x 1, L sup helix x 1, L earlobe x 1, vertex scalp x 4 (10)  Destruction method: cryotherapy   Informed consent: discussed and consent obtained   Lesion destroyed using liquid nitrogen: Yes   Cryotherapy cycles:  2 Outcome: patient tolerated procedure well with no complications   Post-procedure  details: wound care instructions given    Multiple benign nevi  Lentigines  Seborrheic keratoses  Actinic elastosis  Cherry angioma   Return in about 6 months (around 05/31/2023) for TBSE, with Dr. Katrinka Blazing, Hx SCC, Hx AK.  Anise Salvo, RMA, am acting as scribe for Elie Goody, MD .   Documentation: I have reviewed the above documentation for accuracy and completeness, and I agree with the above.  Elie Goody, MD

## 2022-12-01 NOTE — Patient Instructions (Addendum)
Cryotherapy Aftercare  Wash gently with soap and water everyday.   Apply Vaseline and Band-Aid daily until healed.  Treatment Plan for feet: Continue ciclopirox twice daily to feet and in between toes until clear then once weekly for prevention.    Wound Care Instructions  Cleanse wound gently with soap and water once a day then pat dry with clean gauze. Apply a thin coat of Petrolatum (petroleum jelly, "Vaseline") over the wound (unless you have an allergy to this). We recommend that you use a new, sterile tube of Vaseline. Do not pick or remove scabs. Do not remove the yellow or white "healing tissue" from the base of the wound.  Cover the wound with fresh, clean, nonstick gauze and secure with paper tape. You may use Band-Aids in place of gauze and tape if the wound is small enough, but would recommend trimming much of the tape off as there is often too much. Sometimes Band-Aids can irritate the skin.  You should call the office for your biopsy report after 1 week if you have not already been contacted.  If you experience any problems, such as abnormal amounts of bleeding, swelling, significant bruising, significant pain, or evidence of infection, please call the office immediately.  FOR ADULT SURGERY PATIENTS: If you need something for pain relief you may take 1 extra strength Tylenol (acetaminophen) AND 2 Ibuprofen (200mg  each) together every 4 hours as needed for pain. (do not take these if you are allergic to them or if you have a reason you should not take them.) Typically, you may only need pain medication for 1 to 3 days.   Melanoma ABCDEs  Melanoma is the most dangerous type of skin cancer, and is the leading cause of death from skin disease.  You are more likely to develop melanoma if you: Have light-colored skin, light-colored eyes, or red or blond hair Spend a lot of time in the sun Tan regularly, either outdoors or in a tanning bed Have had blistering sunburns, especially  during childhood Have a close family member who has had a melanoma Have atypical moles or large birthmarks  Early detection of melanoma is key since treatment is typically straightforward and cure rates are extremely high if we catch it early.   The first sign of melanoma is often a change in a mole or a new dark spot.  The ABCDE system is a way of remembering the signs of melanoma.  A for asymmetry:  The two halves do not match. B for border:  The edges of the growth are irregular. C for color:  A mixture of colors are present instead of an even brown color. D for diameter:  Melanomas are usually (but not always) greater than 6mm - the size of a pencil eraser. E for evolution:  The spot keeps changing in size, shape, and color.  Please check your skin once per month between visits. You can use a small mirror in front and a large mirror behind you to keep an eye on the back side or your body.   If you see any new or changing lesions before your next follow-up, please call to schedule a visit.  Please continue daily skin protection including broad spectrum sunscreen SPF 30+ to sun-exposed areas, reapplying every 2 hours as needed when you're outdoors.    Due to recent changes in healthcare laws, you may see results of your pathology and/or laboratory studies on MyChart before the doctors have had a chance to review them.  We understand that in some cases there may be results that are confusing or concerning to you. Please understand that not all results are received at the same time and often the doctors may need to interpret multiple results in order to provide you with the best plan of care or course of treatment. Therefore, we ask that you please give [place] 2 business days to thoroughly review all your results before contacting the office for clarification. Should we see a critical lab result, you will be contacted sooner.   If You Need Anything After Your Visit  If you have any questions or  concerns for your doctor, please call our main line at [phone] and press option 4 to reach your doctor's medical assistant. If no one answers, please leave a voicemail as directed and we will return your call as soon as possible. Messages left after 4 pm will be answered the following business day.   You may also send [place] a message via MyChart. We typically respond to MyChart messages within 1-2 business days.  For prescription refills, please ask your pharmacy to contact our office. Our fax number is [phone].  If you have an urgent issue when the clinic is closed that cannot wait until the next business day, you can page your doctor at the number below.    Please note that while we do our best to be available for urgent issues outside of office hours, we are not available 24/7.   If you have an urgent issue and are unable to reach [place], you may choose to seek medical care at your doctor's office, retail clinic, urgent care center, or emergency room.  If you have a medical emergency, please immediately call 911 or go to the emergency department.  Pager Numbers  - Dr. Gwen Pounds: [phone]  - Dr. Roseanne Reno: [phone]  - Dr. Katrinka Blazing: [phone]   In the event of inclement weather, please call our main line at [phone] for an update on the status of any delays or closures.  Dermatology Medication Tips: Please keep the boxes that topical medications come in in order to help keep track of the instructions about where and how to use these. Pharmacies typically print the medication instructions only on the boxes and not directly on the medication tubes.   If your medication is too expensive, please contact our office at [phone] option 4 or send [place] a message through MyChart.   We are unable to tell what your co-pay for medications will be in advance as this is different depending on your insurance coverage. However, we may be able to find a substitute medication at lower cost  or fill out paperwork to get insurance to cover a needed medication.   If a prior authorization is required to get your medication covered by your insurance company, please allow [place] 1-2 business days to complete this process.  Drug prices often vary depending on where the prescription is filled and some pharmacies may offer cheaper prices.  The website www.goodrx.com contains coupons for medications through different pharmacies. The prices here do not account for what the cost may be with help from insurance (it may be cheaper with your insurance), but the website can give you the price if you did not use any insurance.  - You can print the associated coupon and take it with your prescription to the pharmacy.  - You may also stop by our office during regular business hours and pick up a GoodRx coupon card.  -  If you need your prescription sent electronically to a different pharmacy, notify our office through [facility] or by phone at [phone] option 4.

## 2022-12-04 ENCOUNTER — Telehealth: Payer: Self-pay | Admitting: Dermatology

## 2022-12-04 ENCOUNTER — Telehealth: Payer: Self-pay

## 2022-12-04 DIAGNOSIS — C44212 Basal cell carcinoma of skin of right ear and external auricular canal: Secondary | ICD-10-CM

## 2022-12-04 NOTE — Telephone Encounter (Signed)
Discussed pathology results with patient's wife. Discussed treatment of Mohs surgery. They prefer to go back to [facility] in [place] as they have been there before. Referral sent.

## 2022-12-04 NOTE — Telephone Encounter (Signed)
Diagnosis:      1. Skin, right superior posterior helix :       BASAL CELL CARCINOMA, MICRONODULAR PATTERN   Please call with diagnosis and determine where the patient would like to have Mohs surgery. If the patient asks for my recommendation for Mohs surgeon, please refer to [facility] (Dr Coralie Carpen and Dr Caprice Beaver)  Explanation: your biopsy shows a basal cell skin cancer in the second layer of the skin. This is the most common kind of skin cancer and is caused by damage from sun exposure. Basal cell skin cancers almost never spread beyond the skin, so they are not dangerous to your overall health. However, they will continue to grow, can bleed, cause nonhealing wounds, and disrupt nearby structures unless fully treated.  Treatment: Given the location and type of skin cancer, I recommend Mohs surgery. Mohs surgery involves cutting out the skin cancer and then checking under the microscope to ensure the whole skin cancer was removed. If any skin cancer remains, the surgeon will cut out more until it is fully removed. The cure rate is about 98-99%. Once the Mohs surgeon confirms the skin cancer is out, they will discuss the options to repair or heal the area. You must take it easy for about two weeks after surgery (no lifting over 10-15 lbs, avoid activity to get your heart rate and blood pressure up). It is done at another office outside of [place] ([place], [place], or [place]).

## 2022-12-18 ENCOUNTER — Other Ambulatory Visit: Payer: Self-pay | Admitting: Radiation Oncology

## 2022-12-20 ENCOUNTER — Other Ambulatory Visit: Payer: Self-pay | Admitting: Radiation Oncology

## 2022-12-25 ENCOUNTER — Other Ambulatory Visit: Payer: Self-pay | Admitting: *Deleted

## 2022-12-25 ENCOUNTER — Encounter: Payer: Self-pay | Admitting: Internal Medicine

## 2022-12-25 ENCOUNTER — Ambulatory Visit: Payer: Medicare HMO | Admitting: Internal Medicine

## 2022-12-25 VITALS — BP 114/74 | HR 79 | Temp 97.7°F | Ht 68.75 in | Wt 202.0 lb

## 2022-12-25 DIAGNOSIS — K2270 Barrett's esophagus without dysplasia: Secondary | ICD-10-CM | POA: Diagnosis not present

## 2022-12-25 DIAGNOSIS — M720 Palmar fascial fibromatosis [Dupuytren]: Secondary | ICD-10-CM | POA: Insufficient documentation

## 2022-12-25 DIAGNOSIS — C61 Malignant neoplasm of prostate: Secondary | ICD-10-CM

## 2022-12-25 DIAGNOSIS — Z23 Encounter for immunization: Secondary | ICD-10-CM

## 2022-12-25 DIAGNOSIS — Z0000 Encounter for general adult medical examination without abnormal findings: Secondary | ICD-10-CM

## 2022-12-25 DIAGNOSIS — R413 Other amnesia: Secondary | ICD-10-CM | POA: Diagnosis not present

## 2022-12-25 DIAGNOSIS — Z0001 Encounter for general adult medical examination with abnormal findings: Secondary | ICD-10-CM | POA: Diagnosis not present

## 2022-12-25 DIAGNOSIS — I479 Paroxysmal tachycardia, unspecified: Secondary | ICD-10-CM

## 2022-12-25 MED ORDER — TAMSULOSIN HCL 0.4 MG PO CAPS: 0.4000 mg | ORAL_CAPSULE | Freq: Every day | ORAL | 3 refills | Status: DC

## 2022-12-25 NOTE — Assessment & Plan Note (Signed)
Minimal symptoms on the omeprazole daily

## 2022-12-25 NOTE — Assessment & Plan Note (Signed)
I have personally reviewed the Medicare Annual Wellness questionnaire and have noted 1. The patient's medical and social history 2. Their use of alcohol, tobacco or illicit drugs 3. Their current medications and supplements 4. The patient's functional ability including ADL's, fall risks, home safety risks and hearing or visual             impairment. 5. Diet and physical activities 6. Evidence for depression or mood disorders  The patients weight, height, BMI and visual acuity have been recorded in the chart I have made referrals, counseling and provided education to the patient based review of the above and I have provided the pt with a written personalized care plan for preventive services.  I have provided you with a copy of your personalized plan for preventive services. Please take the time to review along with your updated medication list.  Done with cancer screening Does exercise regularly Flu vaccine today Will need Td, COVID and RSV at pharmacy

## 2022-12-25 NOTE — Assessment & Plan Note (Signed)
Not severe Not AD pattern Will just check labs---consider MRI/neurology if worsens

## 2022-12-25 NOTE — Progress Notes (Signed)
Hearing Screening - Comments:: Has hearing aids. Wearing them today  Vision Screening - Comments:: June 2024  

## 2022-12-25 NOTE — Assessment & Plan Note (Signed)
Mild in both 3rd fingers Not ready for Rx If worsens, will proceed with hand evaluation

## 2022-12-25 NOTE — Assessment & Plan Note (Signed)
No symptoms Low burden on monitor Would try metoprolol if has symptoms

## 2022-12-25 NOTE — Progress Notes (Signed)
Subjective:    Patient ID: Christian Carlson, male    DOB: January 12, 1941, 82 y.o.   MRN: [id number]  HPI Here with wife for Medicare wellness visit and follow up of chronic health conditions Reviewed advanced directives Reviewed other doctors---Dr Smith---derm, Dr Charlynne Pander oncology, Dr Rao--oncology, Dr Peyton Najjar, Dr Kemp--dentist, Dr Rathman--urology Had right inguinal hernia repair in February (Dr Latricia Heft) No hospitalizations in the past year--and no other surgery Regular exercise No alcohol or tobacco No falls[place] No depression or anhedonia Vision is okay--reading glasses Hearing aides help Independent with instrumental ADLs  Did have repeat radiation for the prostate cancer Getting lupron every 6 months Tolerating this okay--is on calcium. Does have some night sweats  Having trouble with his fingers Dupuytren's contractures starting---will occasionally hurt when releasing them Mostly the 3rd fingers  Having some memory issues Clear decline from 2 years ago Forgot bank PIN, etc Some trouble with the computer  No palpitations lately No chest pain or SOB No change in exercise tolerance No dizziness or syncope No edema  Does have some dysphagia--but uncommon No regular heartbur  Current Outpatient Medications on File Prior to Visit  Medication Sig Dispense Refill   aspirin 81 MG chewable tablet Chew 81 mg by mouth daily.     calcium carbonate (OSCAL) 1500 (600 Ca) MG TABS tablet Take 600 mg of elemental calcium by mouth daily with breakfast.     ciclopirox (LOPROX) 0.77 % cream APPLY AS DIRECTED APPLY TO FEET AND IN BETWEEN TOES TWICE DAILY 90 g 1   magnesium oxide (MAG-OX) 400 MG tablet Take 400 mg by mouth daily.     Multiple Vitamin (MULTI-VITAMINS) TABS Take 1 tablet by mouth daily.     omeprazole (PRILOSEC) 20 MG capsule Take 20 mg by mouth daily.     valACYclovir (VALTREX) 1000 MG tablet Take 2 tablets at first sign of  symptoms, repeat in 12 hours for a 1 day dose. Stop Treatment.  Repeat as needed for new cold sores 12 tablet 1   Zinc 50 MG CAPS      No current facility-administered medications on file prior to visit.    No Known Allergies  Past Medical History:  Diagnosis Date   Barrett's esophagus    Basal cell carcinoma 12/01/2022   Right superior posterior helix. Micronodular pattern. 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    Past Surgical History:  Procedure Laterality Date   CATARACT EXTRACTION, BILATERAL  2015   ESOPHAGOGASTRODUODENOSCOPY (EGD) WITH PROPOFOL N/A 10/09/2017   Surgeon: Wyline Mood, MD; Location: [facility]; REPEAT 3 YEARS 10/2020 Barrett's esophagus   INGUINAL HERNIA REPAIR Right 04/29/2022   Procedure: RIGHT HERNIA REPAIR INGUINAL ADULT WITH MESH;  Surgeon: Harriette Bouillon, MD;  Location: [facility];  Service: General;  Laterality: Right;  TAP BLOCK   INSERTION OF MESH  04/29/2022   Procedure: INSERTION OF MESH;  Surgeon: Harriette Bouillon, MD;  Location: [facility];  Service: General;;   TONSILLECTOMY  AND ADENOIDECTOMY      Family History  Problem Relation Age of Onset   Dementia Mother    Stroke Father    Diabetes Father    Hypertension Sister    Dementia Sister    Stroke Brother    Esophageal cancer Brother    Stroke Paternal Great-grandfather    Heart disease Neg Hx     Social History   Socioeconomic History   Marital status: Married    Spouse name: Not on file   Number of children: 2   Years of education: Not on file    Highest education level: Not on file  Occupational History   Occupation: Production assistant, radio    Comment: Retired  Tobacco Use   Smoking status: Former    Passive exposure: Past   Smokeless tobacco: Never  Substance and Sexual Activity   Alcohol use: No    Comment: quit 2 years ago with wife's breast cancer   Drug use: Never   Sexual activity: Not on file  Other Topics Concern   Not on file  Social History Narrative   Has living will   Wife, then daughter, should make health care decisions   Would accept resuscitation but no prolonged ventilation   No tube feeds if cognitively unaware   Social Determinants of Health   Financial Resource Strain: Not on file  Food Insecurity: Not on file  Transportation Needs: Not on file  Physical Activity: Not on file  Stress: Not on file  Social Connections: Not on file  Intimate Partner Violence: Not on file   Review of Systems Appetite is good Weight is stable Sleeps well Wears seat belt Teeth are good---keeps up with dentist Bowels move fine--no blood No sig back or joint pains No suspicious skin lesions---does have cancer surgery planned    Objective:   Physical Exam Constitutional:      Appearance: Normal appearance.  HENT:     Mouth/Throat:     Pharynx: No oropharyngeal exudate or posterior oropharyngeal erythema.  Eyes:     Conjunctiva/sclera: Conjunctivae normal.     Pupils: Pupils are equal, round, and reactive to light.  Cardiovascular:     Rate and Rhythm: Normal rate and regular rhythm.     Pulses: Normal pulses.     Heart sounds: No murmur heard.    No gallop.  Pulmonary:     Effort: Pulmonary effort is normal.     Breath sounds: Normal breath sounds. No wheezing or rales.  Abdominal:     Palpations: Abdomen is soft.     Tenderness: There is no abdominal tenderness.  Musculoskeletal:     Cervical back: Neck supple.     Right lower leg: No edema.     Left lower leg: No edema.  Lymphadenopathy:      Cervical: No cervical adenopathy.  Skin:    Findings: No lesion or rash.  Neurological:     General: No focal deficit present.     Mental Status: He is alert and oriented to person, place, and time.     Comments: Word naming-- 9/1 minute Recall 2/3  Psychiatric:        Mood and Affect: Mood normal.        Behavior: Behavior normal.            Assessment & Plan:

## 2022-12-25 NOTE — Assessment & Plan Note (Signed)
Recurred after RT Got another course of RT and now lupron Recent PSA essentially zero

## 2022-12-26 LAB — HEPATIC FUNCTION PANEL
ALT: 15 U/L (ref 0–53)
AST: 19 U/L (ref 0–37)
Albumin: 3.9 g/dL (ref 3.5–5.2)
Alkaline Phosphatase: 50 U/L (ref 39–117)
Bilirubin, Direct: 0.1 mg/dL (ref 0.0–0.3)
Total Bilirubin: 0.3 mg/dL (ref 0.2–1.2)
Total Protein: 5.9 g/dL — ABNORMAL LOW (ref 6.0–8.3)

## 2022-12-26 LAB — CBC
HCT: 39.1 % (ref 39.0–52.0)
Hemoglobin: 12.6 g/dL — ABNORMAL LOW (ref 13.0–17.0)
MCHC: 32.3 g/dL (ref 30.0–36.0)
MCV: 94.4 fL (ref 78.0–100.0)
Platelets: 206 10*3/uL (ref 150.0–400.0)
RBC: 4.14 Mil/uL — ABNORMAL LOW (ref 4.22–5.81)
RDW: 13.9 % (ref 11.5–15.5)
WBC: 4.7 10*3/uL (ref 4.0–10.5)

## 2022-12-26 LAB — TSH: TSH: 3.78 u[IU]/mL (ref 0.35–5.50)

## 2022-12-26 LAB — RENAL FUNCTION PANEL
Albumin: 3.9 g/dL (ref 3.5–5.2)
BUN: 16 mg/dL (ref 6–23)
CO2: 30 meq/L (ref 19–32)
Calcium: 9.2 mg/dL (ref 8.4–10.5)
Chloride: 104 meq/L (ref 96–112)
Creatinine, Ser: 1 mg/dL (ref 0.40–1.50)
GFR: 69.85 mL/min (ref >60.00)
Glucose, Bld: 90 mg/dL (ref 70–99)
Phosphorus: 4.2 mg/dL (ref 2.3–4.6)
Potassium: 4.9 meq/L (ref 3.5–5.1)
Sodium: 141 meq/L (ref 135–145)

## 2022-12-26 LAB — VITAMIN B12: Vitamin B-12: 303 pg/mL (ref 211–911)

## 2023-01-14 ENCOUNTER — Inpatient Hospital Stay: Payer: Medicare HMO | Admitting: Oncology

## 2023-01-14 ENCOUNTER — Telehealth: Payer: Self-pay | Admitting: *Deleted

## 2023-01-14 ENCOUNTER — Encounter: Payer: Self-pay | Admitting: Oncology

## 2023-01-14 ENCOUNTER — Inpatient Hospital Stay: Payer: Medicare HMO

## 2023-01-14 ENCOUNTER — Inpatient Hospital Stay: Payer: Medicare HMO | Attending: Oncology

## 2023-01-14 VITALS — BP 129/81 | HR 94 | Temp 98.1°F | Ht 68.75 in | Wt 205.0 lb

## 2023-01-14 DIAGNOSIS — Z5111 Encounter for antineoplastic chemotherapy: Secondary | ICD-10-CM | POA: Diagnosis present

## 2023-01-14 DIAGNOSIS — Z5181 Encounter for therapeutic drug level monitoring: Secondary | ICD-10-CM

## 2023-01-14 DIAGNOSIS — C775 Secondary and unspecified malignant neoplasm of intrapelvic lymph nodes: Secondary | ICD-10-CM | POA: Insufficient documentation

## 2023-01-14 DIAGNOSIS — Z79818 Long term (current) use of other agents affecting estrogen receptors and estrogen levels: Secondary | ICD-10-CM | POA: Diagnosis not present

## 2023-01-14 DIAGNOSIS — C61 Malignant neoplasm of prostate: Secondary | ICD-10-CM | POA: Insufficient documentation

## 2023-01-14 DIAGNOSIS — M85851 Other specified disorders of bone density and structure, right thigh: Secondary | ICD-10-CM

## 2023-01-14 LAB — COMPREHENSIVE METABOLIC PANEL
ALT: 23 U/L (ref 0–44)
AST: 24 U/L (ref 15–41)
Albumin: 3.8 g/dL (ref 3.5–5.0)
Alkaline Phosphatase: 50 U/L (ref 38–126)
Anion gap: 7 (ref 5–15)
BUN: 19 mg/dL (ref 8–23)
CO2: 27 mmol/L (ref 22–32)
Calcium: 8.8 mg/dL — ABNORMAL LOW (ref 8.9–10.3)
Chloride: 104 mmol/L (ref 98–111)
Creatinine, Ser: 1.04 mg/dL (ref 0.61–1.24)
GFR, Estimated: >60 mL/min (ref >60)
Glucose, Bld: 96 mg/dL (ref 70–99)
Potassium: 4.4 mmol/L (ref 3.5–5.1)
Sodium: 138 mmol/L (ref 135–145)
Total Bilirubin: 0.5 mg/dL (ref 0.3–1.2)
Total Protein: 6.6 g/dL (ref 6.5–8.1)

## 2023-01-14 LAB — CBC WITH DIFFERENTIAL/PLATELET
Abs Immature Granulocytes: 0.02 K/uL (ref 0.00–0.07)
Basophils Absolute: 0 K/uL (ref 0.0–0.1)
Basophils Relative: 0 %
Eosinophils Absolute: 0.2 K/uL (ref 0.0–0.5)
Eosinophils Relative: 4 %
HCT: 38.4 % — ABNORMAL LOW (ref 39.0–52.0)
Hemoglobin: 12.6 g/dL — ABNORMAL LOW (ref 13.0–17.0)
Immature Granulocytes: 0 %
Lymphocytes Relative: 15 %
Lymphs Abs: 0.8 K/uL (ref 0.7–4.0)
MCH: 30.6 pg (ref 26.0–34.0)
MCHC: 32.8 g/dL (ref 30.0–36.0)
MCV: 93.2 fL (ref 80.0–100.0)
Monocytes Absolute: 0.6 K/uL (ref 0.1–1.0)
Monocytes Relative: 11 %
Neutro Abs: 3.6 K/uL (ref 1.7–7.7)
Neutrophils Relative %: 70 %
Platelets: 187 K/uL (ref 150–400)
RBC: 4.12 MIL/uL — ABNORMAL LOW (ref 4.22–5.81)
RDW: 12.9 % (ref 11.5–15.5)
WBC: 5.1 K/uL (ref 4.0–10.5)
nRBC: 0 % (ref 0.0–0.2)

## 2023-01-14 LAB — PSA: Prostatic Specific Antigen: 0.03 ng/mL (ref 0.00–4.00)

## 2023-01-14 MED ORDER — LEUPROLIDE ACETATE (6 MONTH) 45 MG ~~LOC~~ KIT
45.0000 mg | PACK | Freq: Once | SUBCUTANEOUS | Status: AC
  Administered 2023-01-14: 45 mg via SUBCUTANEOUS
  Filled 2023-01-14: qty 45

## 2023-01-14 NOTE — Telephone Encounter (Signed)
Faxed a dental clearance request to Dr Jackquline Bosch office fax # [phone]. Patient is needing to start Reclast.

## 2023-01-15 ENCOUNTER — Other Ambulatory Visit: Payer: Self-pay | Admitting: Oncology

## 2023-01-16 NOTE — Progress Notes (Unsigned)
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  Only 1 core was submitted for pathology diagnosis and therefore additional information is not known based on that biopsy specimen.  He received IMRT radiation therapy for the same.  He has not had a prostatectomy.  Patient received 1 dose of Lupron back in April 2020.  He has now been referred to [place] for rising PSA.  Most recent PSA from 12/12/2021 was 1.92.      PSMA PET scan showed no focal activity to localized prostatic carcinoma.  2 very small PSA avid right internal iliac lymph nodes consistent with nodal metastases with an SUV uptake of 16.5.  No evidence of nodal metastases outside of pelvis.  No visceral metastases or skeletal metastases.   Plan completed radiation treatment to the prostate and external iliac lymph nodes.  ADT started in October 2023 with plan to give it for 2 years.  Bone Density scan shows osteopenia involving the right femur neck with a T-score of -1.4.  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  Denies other complaints at this time.  Tolerating  Lupron well without any significant side effects  ECOG PS- 1 Pain scale- 0   Review of systems- Review of Systems  Constitutional:  Negative for chills, fever, malaise/fatigue and weight loss.  HENT:  Negative for congestion, ear discharge and nosebleeds.   Eyes:  Negative for blurred vision.  Respiratory:  Negative for cough, hemoptysis, sputum production, shortness of breath and wheezing.   Cardiovascular:  Negative for chest pain, palpitations, orthopnea and claudication.  Gastrointestinal:  Negative for abdominal pain, blood in stool, constipation, diarrhea, heartburn, melena, nausea and vomiting.  Genitourinary:  Negative for dysuria, flank pain, frequency, hematuria and urgency.  Musculoskeletal:  Negative for back pain, joint pain and myalgias.  Skin:  Negative for rash.  Neurological:  Negative for dizziness, tingling, focal weakness, seizures, weakness and headaches.  Endo/Heme/Allergies:  Does not bruise/bleed easily.  Psychiatric/Behavioral:  Negative for depression and suicidal ideas. The patient does not have insomnia.       No Known Allergies   Past Medical History:  Diagnosis Date  . Barrett's esophagus   . Basal cell carcinoma 12/01/2022   Right superior posterior helix. Micronodular pattern. Mohs pending  . BPH with obstruction/lower urinary tract symptoms   . Diverticulosis   . GERD (gastroesophageal reflux disease)   . Internal hemorrhoids   . Obstructive sleep apnea   . 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 Prostate cancer (HCC) 05/2018   Gleason 3+4  . Rosacea   . 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 CATARACT EXTRACTION, BILATERAL  2015  . ESOPHAGOGASTRODUODENOSCOPY (EGD) WITH PROPOFOL N/A 10/09/2017   Surgeon: Wyline Mood, MD; Location: [facility] ENDOSCOPY; REPEAT 3  YEARS 10/2020 Barrett's esophagus  . INGUINAL HERNIA REPAIR Right 04/29/2022   Procedure: RIGHT HERNIA REPAIR INGUINAL ADULT WITH MESH;  Surgeon: Harriette Bouillon, MD;  Location: [facility];  Service: General;  Laterality: Right;  TAP BLOCK  . INSERTION OF MESH  04/29/2022   Procedure: INSERTION OF MESH;  Surgeon: Harriette Bouillon, MD;  Location: [facility];  Service: General;;  . TONSILLECTOMY AND ADENOIDECTOMY      Social History   Socioeconomic History  . Marital status: Married    Spouse name: Not on file  . Number of children: 2  . Years of education: Not on file  . Highest education level: Not on file  Occupational History  . Occupation: Production assistant, radio    Comment: Retired  Tobacco Use  . Smoking status: Former    Passive exposure: Past  . Smokeless tobacco: Never  Substance and Sexual Activity  . Alcohol use: No    Comment: quit 2 years ago with wife's breast cancer  . Drug use: Never  . Sexual activity: Not on file  Other Topics Concern  . Not on file  Social History Narrative   Has living will   Wife, then daughter, should make health care decisions   Would accept resuscitation but no prolonged ventilation   No tube feeds if cognitively unaware   Social Determinants of Health   Financial Resource Strain: Not on file  Food Insecurity: Not on file  Transportation Needs: Not on file  Physical Activity: Not on file  Stress: Not on file  Social Connections: Not on file  Intimate Partner Violence: Not on file    Family History  Problem Relation Age of Onset  . Dementia Mother   . Stroke Father   . Diabetes Father   . Hypertension Sister   . Dementia Sister   . Stroke Brother   . Esophageal cancer Brother   . Stroke Paternal Great-grandfather   . Heart disease Neg Hx      Current Outpatient Medications:  .  aspirin 81 MG chewable tablet, Chew 81 mg by mouth daily., Disp: , Rfl:  .  calcium carbonate (OSCAL) 1500 (600  Ca) MG TABS tablet, Take 600 mg of elemental calcium by mouth daily with breakfast., Disp: , Rfl:  .  ciclopirox (LOPROX) 0.77 % cream, APPLY AS DIRECTED APPLY TO FEET AND IN BETWEEN TOES TWICE DAILY, Disp: 90 g, Rfl: 1 .  magnesium oxide (MAG-OX) 400 MG tablet, Take 400 mg by mouth daily., Disp: , Rfl:  .  Multiple Vitamin (MULTI-VITAMINS) TABS, Take 1 tablet by mouth daily., Disp: , Rfl:  .  omeprazole (PRILOSEC) 20 MG capsule, Take 20 mg by mouth daily., Disp: , Rfl:  .  tamsulosin (FLOMAX) 0.4 MG CAPS capsule, Take 1 capsule (0.4 mg total) by mouth daily., Disp: 90 capsule, Rfl: 3 .  valACYclovir (VALTREX) 1000 MG tablet, Take 2 tablets at first sign of symptoms, repeat in 12 hours for a 1 day dose. Stop Treatment.  Repeat as needed for new cold sores, Disp: 12 tablet, Rfl: 1 .  Zinc 50 MG CAPS, , Disp: , Rfl:   Physical exam:  Vitals:   01/14/23 1317  BP: 129/81  Pulse: 94  Temp: 98.1 F (36.7 C)  TempSrc: Tympanic  SpO2: 96%  Weight: 205 lb (93 kg)  Height: 5' 8.75" (1.746 m)   Physical Exam Cardiovascular:     Rate and Rhythm: Normal rate and regular rhythm.     Heart sounds: Normal heart sounds.  Pulmonary:     Effort: Pulmonary effort is normal.     Breath sounds: Normal breath sounds.  Skin:    General: Skin is warm and dry.  Neurological:     Mental Status: He is alert and oriented to person, place, and time.        Latest Ref Rng & Units 01/14/2023   12:54 PM  CMP  Glucose 70 - 99 mg/dL 96   BUN 8 - 23 mg/dL 19   Creatinine 7.82 - 1.24 mg/dL 9.56   Sodium 213 - 086 mmol/L 138   Potassium 3.5 - 5.1 mmol/L 4.4   Chloride 98 - 111 mmol/L 104   CO2 22 - 32 mmol/L 27   Calcium 8.9 - 10.3 mg/dL 8.8   Total Protein 6.5 - 8.1 g/dL 6.6   Total Bilirubin 0.3 - 1.2 mg/dL 0.5   Alkaline Phos 38 - 126 U/L 50   AST 15 - 41 U/L 24   ALT 0 - 44 U/L 23       Latest Ref Rng & Units 01/14/2023   12:54 PM  CBC  WBC 4.0 - 10.5 K/uL 5.1   Hemoglobin 13.0 - 17.0 g/dL  57.8   Hematocrit 46.9 - 52.0 % 38.4   Platelets 150 - 400 K/uL 187      Assessment and plan- Patient is a 82 y.o. male ***   Visit Diagnosis 1. Prostate cancer [facility])      Dr. Owens Shark, MD, MPH [facility] [phone] 01/16/2023 8:46 AM

## 2023-01-17 ENCOUNTER — Encounter: Payer: Self-pay | Admitting: Internal Medicine

## 2023-01-21 ENCOUNTER — Inpatient Hospital Stay: Payer: Medicare HMO

## 2023-01-22 ENCOUNTER — Inpatient Hospital Stay: Payer: Medicare HMO | Attending: Oncology

## 2023-01-22 VITALS — BP 124/79 | HR 93 | Temp 97.9°F | Resp 16 | Wt 204.6 lb

## 2023-01-22 DIAGNOSIS — M85851 Other specified disorders of bone density and structure, right thigh: Secondary | ICD-10-CM | POA: Diagnosis present

## 2023-01-22 DIAGNOSIS — C61 Malignant neoplasm of prostate: Secondary | ICD-10-CM | POA: Diagnosis not present

## 2023-01-22 DIAGNOSIS — C775 Secondary and unspecified malignant neoplasm of intrapelvic lymph nodes: Secondary | ICD-10-CM | POA: Diagnosis not present

## 2023-01-22 DIAGNOSIS — Z87891 Personal history of nicotine dependence: Secondary | ICD-10-CM | POA: Diagnosis not present

## 2023-01-22 DIAGNOSIS — Z923 Personal history of irradiation: Secondary | ICD-10-CM | POA: Insufficient documentation

## 2023-01-22 MED ORDER — SODIUM CHLORIDE 0.9 % IV SOLN
Freq: Once | INTRAVENOUS | Status: AC
Start: 2023-01-22 — End: 2023-01-22
  Filled 2023-01-22: qty 250

## 2023-01-22 MED ORDER — ZOLEDRONIC ACID 5 MG/100ML IV SOLN
5.0000 mg | INTRAVENOUS | Status: DC
  Administered 2023-01-22: 5 mg via INTRAVENOUS
  Filled 2023-01-22: qty 100

## 2023-01-29 ENCOUNTER — Other Ambulatory Visit: Payer: Self-pay | Admitting: *Deleted

## 2023-01-29 ENCOUNTER — Other Ambulatory Visit: Payer: Self-pay

## 2023-01-29 ENCOUNTER — Inpatient Hospital Stay: Payer: Medicare HMO

## 2023-01-29 DIAGNOSIS — M85851 Other specified disorders of bone density and structure, right thigh: Secondary | ICD-10-CM | POA: Diagnosis not present

## 2023-01-29 DIAGNOSIS — C61 Malignant neoplasm of prostate: Secondary | ICD-10-CM

## 2023-01-29 LAB — PSA: Prostatic Specific Antigen: 0.04 ng/mL (ref 0.00–4.00)

## 2023-02-04 ENCOUNTER — Ambulatory Visit
Admission: RE | Admit: 2023-02-04 | Discharge: 2023-02-04 | Disposition: A | Discharge status: Home / Self Care | Payer: Medicare HMO | Source: Ambulatory Visit | Attending: Radiation Oncology | Admitting: Radiation Oncology

## 2023-02-04 VITALS — BP 112/72 | HR 79 | Temp 97.7°F | Wt 206.0 lb

## 2023-02-04 DIAGNOSIS — C61 Malignant neoplasm of prostate: Secondary | ICD-10-CM | POA: Insufficient documentation

## 2023-02-04 DIAGNOSIS — Z923 Personal history of irradiation: Secondary | ICD-10-CM | POA: Diagnosis not present

## 2023-02-04 DIAGNOSIS — R351 Nocturia: Secondary | ICD-10-CM | POA: Diagnosis not present

## 2023-02-04 NOTE — Progress Notes (Signed)
Radiation Oncology Follow up Note  Name: Christian Carlson   Date:   02/04/2023 MRN:  [id number] DOB: [birth year]    This 82 y.o. male presents to the clinic today for 92-month follow-up status post IMRT radiation therapy to his prostate for stage IIa Gleason 7 (3+4) adenocarcinoma presented with a PSA of 11.5.  REFERRING PROVIDER: Karie Schwalbe, MD  HPI: The patient, with a history of stage 2A Gleason 7 (3+4) adenocarcinoma of the prostate, presents for a ten month follow up after IMRT radiation therapy. He initially presented with a PSA of 11.5. His most recent PSA is 0.04, showing excellent biochemical control of his prostate cancer. He has been seeing a urologist, but is considering changing providers due to dissatisfaction with his current care..  He specifically denies any increased lower urinary tract symptoms diarrhea or fatigue.  COMPLICATIONS OF TREATMENT: none  FOLLOW UP COMPLIANCE: keeps appointments   PHYSICAL EXAM:  BP 112/72 (BP Location: Left Arm, Patient Position: Sitting)   Pulse 79   Temp 97.7 F (36.5 C) (Tympanic)   Wt 206 lb (93.4 kg)   SpO2 98%   BMI 30.64 kg/m  Well-developed well-nourished patient in NAD. HEENT reveals PERLA, EOMI, discs not visualized.  Oral cavity is clear. No oral mucosal lesions are identified. Neck is clear without evidence of cervical or supraclavicular adenopathy. Lungs are clear to A&P. Cardiac examination is essentially unremarkable with regular rate and rhythm without murmur rub or thrill. Abdomen is benign with no organomegaly or masses noted. Motor sensory and DTR levels are equal and symmetric in the upper and lower extremities. Cranial nerves II through XII are grossly intact. Proprioception is intact. No peripheral adenopathy or edema is identified. No motor or sensory levels are noted. Crude visual fields are within normal range.  RADIOLOGY RESULTS: LABS PSA: 0.04  PLAN: Prostate Cancer (Stage 2A Gleason 7 adenocarcinoma) 10  months post-IMRT radiation therapy, with excellent biochemical control as evidenced by a stable PSA of 0.04. -Continue current management. -Return for follow-up in 6 months.  Urinary Symptoms Patient reports nocturia x2, managed with Flomax. -Continue Flomax as currently prescribed. -Consider referral to local urology if symptoms worsen or new issues arise.    Carmina Miller, MD

## 2023-03-05 ENCOUNTER — Telehealth: Payer: Self-pay

## 2023-03-05 NOTE — Telephone Encounter (Signed)
Specimen tracking and history updated from [facility] progress notes of R superior posterior helix. aw

## 2023-06-01 ENCOUNTER — Ambulatory Visit: Payer: Medicare HMO | Admitting: Dermatology

## 2023-06-01 ENCOUNTER — Encounter: Payer: Self-pay | Admitting: Dermatology

## 2023-06-01 DIAGNOSIS — D229 Melanocytic nevi, unspecified: Secondary | ICD-10-CM

## 2023-06-01 DIAGNOSIS — Z872 Personal history of diseases of the skin and subcutaneous tissue: Secondary | ICD-10-CM

## 2023-06-01 DIAGNOSIS — L578 Other skin changes due to chronic exposure to nonionizing radiation: Secondary | ICD-10-CM | POA: Diagnosis not present

## 2023-06-01 DIAGNOSIS — Z1283 Encounter for screening for malignant neoplasm of skin: Secondary | ICD-10-CM

## 2023-06-01 DIAGNOSIS — W908XXA Exposure to other nonionizing radiation, initial encounter: Secondary | ICD-10-CM | POA: Diagnosis not present

## 2023-06-01 DIAGNOSIS — L570 Actinic keratosis: Secondary | ICD-10-CM | POA: Diagnosis not present

## 2023-06-01 DIAGNOSIS — L820 Inflamed seborrheic keratosis: Secondary | ICD-10-CM

## 2023-06-01 DIAGNOSIS — L821 Other seborrheic keratosis: Secondary | ICD-10-CM

## 2023-06-01 DIAGNOSIS — D1801 Hemangioma of skin and subcutaneous tissue: Secondary | ICD-10-CM

## 2023-06-01 DIAGNOSIS — L814 Other melanin hyperpigmentation: Secondary | ICD-10-CM

## 2023-06-01 DIAGNOSIS — Z85828 Personal history of other malignant neoplasm of skin: Secondary | ICD-10-CM

## 2023-06-01 NOTE — Patient Instructions (Addendum)

## 2023-06-01 NOTE — Progress Notes (Signed)
 Follow-Up Visit   Subjective  Christian Carlson is a 83 y.o. male who presents for the following: Skin Cancer Screening and Full Body Skin Exam. Hx of BCC, Hx of SCCs. Hx of AKs  Lesion of concern on back of right ear. Itching the other day. Hx of BCC at area 2024.  The patient presents for Total-Body Skin Exam (TBSE) for skin cancer screening and mole check. The patient has spots, moles and lesions to be evaluated, some may be new or changing and the patient may have concern these could be cancer.    The following portions of the chart were reviewed this encounter and updated as appropriate: medications, allergies, medical history  Review of Systems:  No other skin or systemic complaints except as noted in HPI or Assessment and Plan.  Objective  Well appearing patient in no apparent distress; mood and affect are within normal limits.  A full examination was performed including scalp, head, eyes, ears, nose, lips, neck, chest, axillae, abdomen, back, buttocks, bilateral upper extremities, bilateral lower extremities, hands, feet, fingers, toes, fingernails, and toenails. All findings within normal limits unless otherwise noted below.   Relevant physical exam findings are noted in the Assessment and Plan.  L Earlobe x1, L mid helix x1, L sup forehead x1, R frontal scalp x1, R post crus x1 (5) Erythematous thin papules/macules with gritty scale.  Left Forearm - Posterior Erythematous keratotic or waxy stuck-on papule or plaque.  Assessment & Plan   SKIN CANCER SCREENING PERFORMED TODAY.  HISTORY OF SQUAMOUS CELL CARCINOMA OF THE SKIN - Left forehead, Mohs 06/09/2022. 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  - No evidence of recurrence today - No lymphadenopathy - Recommend regular full body skin exams - Recommend daily broad spectrum sunscreen SPF 30+ to sun-exposed areas, reapply every 2 hours as needed.  - Call if any new or changing lesions are noted between office  visits  HISTORY OF BASAL CELL CARCINOMA OF THE SKIN. Right posterior helix, micronodular pattern. Mohs 01/12/2023.  - No evidence of recurrence today - Recommend regular full body skin exams - Recommend daily broad spectrum sunscreen SPF 30+ to sun-exposed areas, reapply every 2 hours as needed.  - Call if any new or changing lesions are noted between office visits - patient prefers annual skin checks  ACTINIC DAMAGE - Chronic condition, secondary to cumulative UV/sun exposure - diffuse scaly erythematous macules with underlying dyspigmentation - Recommend daily broad spectrum sunscreen SPF 30+ to sun-exposed areas, reapply every 2 hours as needed.  - Staying in the shade or wearing long sleeves, sun glasses (UVA+UVB protection) and wide brim hats (4-inch brim around the entire circumference of the hat) are also recommended for sun protection.  - Call for new or changing lesions.  LENTIGINES, SEBORRHEIC KERATOSES, HEMANGIOMAS - Benign normal skin lesions - Benign-appearing - Call for any changes  MELANOCYTIC NEVI - Tan-brown and/or pink-flesh-colored symmetric macules and papules - Benign appearing on exam today - Observation - Call clinic for new or changing moles - Recommend daily use of broad spectrum spf 30+ sunscreen to sun-exposed areas.   HEMANGIOMA Exam: red papule at right back Discussed benign nature. Recommend observation. Call for changes.     AK (ACTINIC KERATOSIS) (5) L Earlobe x1, L mid helix x1, L sup forehead x1, R frontal scalp x1, R post crus x1 (5) Actinic keratoses are precancerous spots that appear secondary to cumulative UV radiation exposure/sun exposure over time. They are chronic with expected duration over 1 year. A  portion of actinic keratoses will progress to squamous cell carcinoma of the skin. It is not possible to reliably predict which spots will progress to skin cancer and so treatment is recommended to prevent development of skin  cancer.  Recommend daily broad spectrum sunscreen SPF 30+ to sun-exposed areas, reapply every 2 hours as needed.  Recommend staying in the shade or wearing long sleeves, sun glasses (UVA+UVB protection) and wide brim hats (4-inch brim around the entire circumference of the hat). Call for new or changing lesions. Destruction of lesion - L Earlobe x1, L mid helix x1, L sup forehead x1, R frontal scalp x1, R post crus x1 (5) Complexity: simple   Destruction method: cryotherapy   Informed consent: discussed and consent obtained   Timeout:  patient name, date of birth, surgical site, and procedure verified Lesion destroyed using liquid nitrogen: Yes   Region frozen until ice ball extended beyond lesion: Yes   Cryo cycles: 1 or 2. Outcome: patient tolerated procedure well with no complications   Post-procedure details: wound care instructions given   Additional details:  Prior to procedure, discussed risks of blister formation, small wound, skin dyspigmentation, or rare scar following cryotherapy. Recommend Vaseline ointment to treated areas while healing.  INFLAMED SEBORRHEIC KERATOSIS Left Forearm - Posterior Patient deferred treatment at this time.  MULTIPLE BENIGN NEVI   LENTIGINES   ACTINIC ELASTOSIS   SEBORRHEIC KERATOSES   CHERRY ANGIOMA   Return in about 6 months (around 12/02/2023) for TBSE, HxBCC, HxSCC, HxAKs.  I, Lawson Radar, CMA, am acting as scribe for Elie Goody, MD.   Documentation: I have reviewed the above documentation for accuracy and completeness, and I agree with the above.  Elie Goody, MD

## 2023-07-15 ENCOUNTER — Inpatient Hospital Stay: Payer: Medicare HMO | Admitting: Oncology

## 2023-07-15 ENCOUNTER — Encounter: Payer: Self-pay | Admitting: Oncology

## 2023-07-15 ENCOUNTER — Inpatient Hospital Stay: Payer: Medicare HMO

## 2023-07-15 ENCOUNTER — Inpatient Hospital Stay: Payer: Medicare HMO | Attending: Oncology

## 2023-07-15 VITALS — BP 121/77 | HR 78 | Temp 97.6°F | Resp 18 | Ht 68.0 in | Wt 200.6 lb

## 2023-07-15 DIAGNOSIS — Z79899 Other long term (current) drug therapy: Secondary | ICD-10-CM

## 2023-07-15 DIAGNOSIS — C61 Malignant neoplasm of prostate: Secondary | ICD-10-CM | POA: Insufficient documentation

## 2023-07-15 DIAGNOSIS — C775 Secondary and unspecified malignant neoplasm of intrapelvic lymph nodes: Secondary | ICD-10-CM | POA: Diagnosis present

## 2023-07-15 DIAGNOSIS — M85851 Other specified disorders of bone density and structure, right thigh: Secondary | ICD-10-CM | POA: Insufficient documentation

## 2023-07-15 DIAGNOSIS — Z5111 Encounter for antineoplastic chemotherapy: Secondary | ICD-10-CM | POA: Diagnosis present

## 2023-07-15 DIAGNOSIS — Z923 Personal history of irradiation: Secondary | ICD-10-CM | POA: Diagnosis not present

## 2023-07-15 DIAGNOSIS — Z7982 Long term (current) use of aspirin: Secondary | ICD-10-CM | POA: Insufficient documentation

## 2023-07-15 DIAGNOSIS — Z79818 Long term (current) use of other agents affecting estrogen receptors and estrogen levels: Secondary | ICD-10-CM | POA: Diagnosis not present

## 2023-07-15 DIAGNOSIS — Z5181 Encounter for therapeutic drug level monitoring: Secondary | ICD-10-CM

## 2023-07-15 LAB — CBC WITH DIFFERENTIAL (CANCER CENTER ONLY)
Abs Immature Granulocytes: 0.02 10*3/uL (ref 0.00–0.07)
Basophils Absolute: 0 10*3/uL (ref 0.0–0.1)
Basophils Relative: 1 %
Eosinophils Absolute: 0.2 10*3/uL (ref 0.0–0.5)
Eosinophils Relative: 5 %
HCT: 39.1 % (ref 39.0–52.0)
Hemoglobin: 12.6 g/dL — ABNORMAL LOW (ref 13.0–17.0)
Immature Granulocytes: 0 %
Lymphocytes Relative: 14 %
Lymphs Abs: 0.7 10*3/uL (ref 0.7–4.0)
MCH: 30.4 pg (ref 26.0–34.0)
MCHC: 32.2 g/dL (ref 30.0–36.0)
MCV: 94.4 fL (ref 80.0–100.0)
Monocytes Absolute: 0.6 10*3/uL (ref 0.1–1.0)
Monocytes Relative: 12 %
Neutro Abs: 3.5 10*3/uL (ref 1.7–7.7)
Neutrophils Relative %: 68 %
Platelet Count: 172 10*3/uL (ref 150–400)
RBC: 4.14 MIL/uL — ABNORMAL LOW (ref 4.22–5.81)
RDW: 13.1 % (ref 11.5–15.5)
WBC Count: 5.1 10*3/uL (ref 4.0–10.5)
nRBC: 0 % (ref 0.0–0.2)

## 2023-07-15 LAB — CMP (CANCER CENTER ONLY)
ALT: 17 U/L (ref 0–44)
AST: 21 U/L (ref 15–41)
Albumin: 3.7 g/dL (ref 3.5–5.0)
Alkaline Phosphatase: 40 U/L (ref 38–126)
Anion gap: 7 (ref 5–15)
BUN: 17 mg/dL (ref 8–23)
CO2: 28 mmol/L (ref 22–32)
Calcium: 8.5 mg/dL — ABNORMAL LOW (ref 8.9–10.3)
Chloride: 105 mmol/L (ref 98–111)
Creatinine: 1.02 mg/dL (ref 0.61–1.24)
GFR, Estimated: >60 mL/min (ref >60)
Glucose, Bld: 85 mg/dL (ref 70–99)
Potassium: 4.4 mmol/L (ref 3.5–5.1)
Sodium: 140 mmol/L (ref 135–145)
Total Bilirubin: 0.5 mg/dL (ref 0.0–1.2)
Total Protein: 6.5 g/dL (ref 6.5–8.1)

## 2023-07-15 LAB — PSA: Prostatic Specific Antigen: 0.02 ng/mL (ref 0.00–4.00)

## 2023-07-15 MED ORDER — LEUPROLIDE ACETATE (6 MONTH) 45 MG ~~LOC~~ KIT
45.0000 mg | PACK | Freq: Once | SUBCUTANEOUS | Status: AC
Start: 2023-07-15 — End: 2023-07-15
  Administered 2023-07-15: 45 mg via SUBCUTANEOUS
  Filled 2023-07-15: qty 45

## 2023-07-15 NOTE — Progress Notes (Signed)
 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  Only 1 core was submitted for pathology diagnosis and therefore additional information is not known based on that biopsy specimen.  He received IMRT radiation therapy for the same.  He has not had a prostatectomy.  Patient received 1 dose of Lupron  back in April 2020.  He has now been referred to us  for rising PSA.  Most recent PSA from 12/12/2021 was 1.92.      PSMA PET scan showed no focal activity to localized prostatic carcinoma.  2 very small PSA avid right internal iliac lymph nodes consistent with nodal metastases with an SUV uptake of 16.5.  No evidence of nodal metastases outside of pelvis.  No visceral metastases or skeletal metastases.   Plan completed radiation treatment to the prostate and external iliac lymph nodes.  ADT started in October 2023 with plan to give it for 2 years.  Bone Density scan shows osteopenia involving the right femur neck with a T-score of -1.4.  Although 10-year probability of major osteoporotic fracture was less than 20% at 15.2%, 10-year probability of hip fracture was more than 3% at 10.7% and hence prophylactic bisphosphonates was recommended.  He received first dose of Reclast  in November 2024  Interval history-he is overall tolerating Lupron  well.  Occasional hot flashes which are self-limited.  He  remains active and independent of his ADLs and IADLs  ECOG PS- 1 Pain scale- 0   Review of systems- Review of Systems  Constitutional:  Negative for chills, fever, malaise/fatigue and weight loss.  HENT:  Negative for congestion, ear discharge and nosebleeds.   Eyes:  Negative for blurred vision.  Respiratory:  Negative for cough, hemoptysis, sputum production, shortness of breath and wheezing.   Cardiovascular:  Negative for chest pain, palpitations, orthopnea and claudication.  Gastrointestinal:  Negative for abdominal pain, blood in stool, constipation, diarrhea, heartburn, melena, nausea and vomiting.  Genitourinary:  Negative for dysuria, flank pain, frequency, hematuria and urgency.  Musculoskeletal:  Negative for back pain, joint pain and myalgias.  Skin:  Negative for rash.  Neurological:  Negative for dizziness, tingling, focal weakness, seizures, weakness and headaches.  Endo/Heme/Allergies:        Hot flashes  Psychiatric/Behavioral:  Negative for depression and suicidal ideas. The patient does not have insomnia.       No Known Allergies   Past Medical History:  Diagnosis Date   Barrett's esophagus    Basal cell carcinoma 12/01/2022   Right superior posterior helix. Micronodular pattern. 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     Past Surgical History:  Procedure Laterality Date   CATARACT EXTRACTION, BILATERAL  2015   ESOPHAGOGASTRODUODENOSCOPY (EGD) WITH PROPOFOL  N/A 10/09/2017   Surgeon: Luke Salaam, MD; Location: [facility]; REPEAT 3 YEARS 10/2020  Barrett's esophagus   INGUINAL HERNIA REPAIR Right 04/29/2022   Procedure: RIGHT HERNIA REPAIR INGUINAL ADULT WITH MESH;  Surgeon: Sim Dryer, MD;  Location: [facility];  Service: General;  Laterality: Right;  TAP BLOCK   INSERTION OF MESH  04/29/2022   Procedure: INSERTION OF MESH;  Surgeon: Sim Dryer, MD;  Location: [facility];  Service: General;;   TONSILLECTOMY AND ADENOIDECTOMY      Social History   Socioeconomic History   Marital status: Married    Spouse name: Not on file   Number of children: 2   Years of education: Not on file   Highest education level: Not on file  Occupational History   Occupation: Production assistant, radio    Comment: Retired  Tobacco Use   Smoking status: Former    Passive exposure: Past   Smokeless tobacco: Never  Substance and Sexual Activity   Alcohol use: No    Comment: quit 2 years ago with wife's breast cancer   Drug use: Never   Sexual activity: Not on file  Other Topics Concern   Not on file  Social History Narrative   Has living will   Wife, then daughter, should make health care decisions   Would accept resuscitation but no prolonged ventilation   No tube feeds if cognitively unaware   Social Drivers of Corporate investment banker Strain: Not on file  Food Insecurity: Not on file  Transportation Needs: Not on file  Physical Activity: Not on file  Stress: Not on file  Social Connections: Not on file  Intimate Partner Violence: Not on file    Family History  Problem Relation Age of Onset   Dementia Mother    Stroke Father    Diabetes Father    Hypertension Sister    Dementia Sister    Stroke Brother    Esophageal cancer Brother    Stroke Paternal Great-grandfather    Heart disease Neg Hx      Current Outpatient Medications:    aspirin 81 MG chewable tablet, Chew 81 mg by mouth daily., Disp: , Rfl:    calcium carbonate (OSCAL) 1500 (600 Ca) MG TABS tablet, Take 600 mg of elemental  calcium by mouth daily with breakfast., Disp: , Rfl:    ciclopirox  (LOPROX ) 0.77 % cream, APPLY AS DIRECTED APPLY TO FEET AND IN BETWEEN TOES TWICE DAILY, Disp: 90 g, Rfl: 1   magnesium oxide (MAG-OX) 400 MG tablet, Take 400 mg by mouth daily., Disp: , Rfl:    Multiple Vitamin (MULTI-VITAMINS) TABS, Take 1 tablet by mouth daily., Disp: , Rfl:    omeprazole (PRILOSEC) 20 MG capsule, Take 20 mg by mouth daily., Disp: , Rfl:    tamsulosin  (FLOMAX ) 0.4 MG CAPS capsule, Take 1 capsule (0.4 mg total) by mouth daily., Disp: 90 capsule, Rfl: 3   valACYclovir  (VALTREX ) 1000 MG tablet, Take 2 tablets at first sign of symptoms, repeat in 12 hours for a 1 day dose. Stop Treatment.  Repeat as needed for new cold sores, Disp: 12 tablet, Rfl: 1   Zinc 50 MG CAPS, , Disp: , Rfl:   Physical exam:  Vitals:   07/15/23 1403  BP: 121/77  Pulse: 78  Resp: 18  Temp: 97.6 F (36.4 C)  TempSrc: Tympanic  SpO2: 98%  Weight: 200 lb 9.6 oz (91 kg)  Height: 5\' 8"  (1.727 m)   Physical Exam Cardiovascular:     Rate and Rhythm: Normal rate and regular rhythm.     Heart sounds: Normal heart sounds.  Pulmonary:     Effort: Pulmonary effort is normal.     Breath sounds: Normal breath sounds.  Skin:    General: Skin is warm and dry.  Neurological:     Mental Status: He is alert and oriented to person, place, and time.      I have personally reviewed labs listed below:    Latest Ref Rng & Units 07/15/2023    1:47 PM  CMP  Glucose 70 - 99 mg/dL 85   BUN 8 - 23 mg/dL 17   Creatinine 4.09 - 1.24 mg/dL 8.11   Sodium 914 - 782 mmol/L 140   Potassium 3.5 - 5.1 mmol/L 4.4   Chloride 98 - 111 mmol/L 105   CO2 22 - 32 mmol/L 28   Calcium 8.9 - 10.3 mg/dL 8.5   Total Protein 6.5 - 8.1 g/dL 6.5   Total Bilirubin 0.0 - 1.2 mg/dL 0.5   Alkaline Phos 38 - 126 U/L 40   AST 15 - 41 U/L 21   ALT 0 - 44 U/L 17       Latest Ref Rng & Units 07/15/2023    1:47 PM  CBC  WBC 4.0 - 10.5 K/uL 5.1   Hemoglobin 13.0 -  17.0 g/dL 95.6   Hematocrit 21.3 - 52.0 % 39.1   Platelets 150 - 400 K/uL 172     Assessment and plan- Patient is a 83 y.o. male with history of stage IVa castrate sensitive prostate cancer with local regional lymph node metastases here for next dose of Lupron   PSA from today has beenDoing good overall PSA has been stable actuating between 0.03 and 0.05 for the last 7 months.  No clear rising trend.  He will receive Lupron  today and I will see him back in 6 months for the next dose of Lupron .  With that he would have completed 2 years of continues Lupron  therapy.  Typically with stage IV disease intermittent ADT is not recommended.  However patient had oligometastatic disease to his internal iliac lymph nodes based on his PSMA PET scan in October 2023.  I will consider repeating a PSMA PET scan in early 2026 and if there is no evidence of active disease I will consider giving him a break from ADT at that time  Patient also has evidence of osteopenia and will receive yearly Reclast  with the next dose to be given in November 2025  CBC with differential CMP and PSA in 3 months in 6 months and I will see him back in 6 months   Visit Diagnosis 1. Encounter for monitoring Lupron  therapy   2. Prostate cancer (HCC)   3. Osteopenia of neck of right femur   4. High risk medication use      Dr. Seretha Dance, MD, MPH [facility] [phone] 07/15/2023 3:49 PM

## 2023-08-13 ENCOUNTER — Inpatient Hospital Stay: Payer: Medicare HMO | Attending: Oncology

## 2023-08-13 DIAGNOSIS — C61 Malignant neoplasm of prostate: Secondary | ICD-10-CM | POA: Insufficient documentation

## 2023-08-13 LAB — PSA: Prostatic Specific Antigen: 0.02 ng/mL (ref 0.00–4.00)

## 2023-08-20 ENCOUNTER — Ambulatory Visit
Admission: RE | Admit: 2023-08-20 | Discharge: 2023-08-20 | Disposition: A | Discharge status: Home / Self Care | Payer: Medicare HMO | Source: Ambulatory Visit | Attending: Radiation Oncology | Admitting: Radiation Oncology

## 2023-08-20 VITALS — BP 117/86 | HR 79 | Temp 96.3°F | Resp 16 | Ht 68.0 in | Wt 203.3 lb

## 2023-08-20 DIAGNOSIS — Z923 Personal history of irradiation: Secondary | ICD-10-CM | POA: Insufficient documentation

## 2023-08-20 DIAGNOSIS — R350 Frequency of micturition: Secondary | ICD-10-CM | POA: Insufficient documentation

## 2023-08-20 DIAGNOSIS — C61 Malignant neoplasm of prostate: Secondary | ICD-10-CM | POA: Insufficient documentation

## 2023-08-20 NOTE — Progress Notes (Signed)
 Radiation Oncology Follow up Note  Name: Christian Carlson   Date:   08/20/2023 MRN:  [id number] DOB: [birth year]    This 83 y.o. male presents to the clinic today for 73-month follow-up status post IMRT radiation therapy to his prostate for stage IIa Gleason 7 (3+4) adenocarcinoma the prostate presenting the PSA of 11.5.  REFERRING PROVIDER: Helaine Llanos, MD  HPI: Patient is a 83 year old male now out 16 months having completed image guided IMRT radiation therapy for Gleason 7 adenocarcinoma the prostate.  Seen today in routine follow-up he is doing well.Christian Carlson  He does have some urinary frequency urgency and nocturia he is on Flomax  which he is tolerating well.  He is currently on Lupron  therapy through medical oncology which he is tolerating well.  His most recent PSA was 0.02 stable over time.  COMPLICATIONS OF TREATMENT: none  FOLLOW UP COMPLIANCE: keeps appointments   PHYSICAL EXAM:  BP 117/86   Pulse 79   Temp (!) 96.3 F (35.7 C) (Tympanic)   Resp 16   Ht 5\' 8"  (1.727 m)   Wt 203 lb 4.8 oz (92.2 kg)   BMI 30.91 kg/m  Well-developed well-nourished patient in NAD. HEENT reveals PERLA, EOMI, discs not visualized.  Oral cavity is clear. No oral mucosal lesions are identified. Neck is clear without evidence of cervical or supraclavicular adenopathy. Lungs are clear to A&P. Cardiac examination is essentially unremarkable with regular rate and rhythm without murmur rub or thrill. Abdomen is benign with no organomegaly or masses noted. Motor sensory and DTR levels are equal and symmetric in the upper and lower extremities. Cranial nerves II through XII are grossly intact. Proprioception is intact. No peripheral adenopathy or edema is identified. No motor or sensory levels are noted. Crude visual fields are within normal range.  RADIOLOGY RESULTS: No current films to review  PLAN: Present time patient is doing well under excellent biochemical control of his prostate cancer currently under ADT  therapy through medical oncology.  I believe they are planning on a PSMA PET scan in the future.  I have asked to see him back in 6 months for follow-up.  Patient knows to call with any concerns.  I would like to take this opportunity to thank you for allowing me to participate in the care of your patient.Glenis Langdon, MD

## 2023-10-14 ENCOUNTER — Other Ambulatory Visit: Payer: Self-pay

## 2023-10-14 ENCOUNTER — Inpatient Hospital Stay: Attending: Oncology

## 2023-10-14 DIAGNOSIS — C61 Malignant neoplasm of prostate: Secondary | ICD-10-CM | POA: Insufficient documentation

## 2023-10-14 LAB — CBC WITH DIFFERENTIAL (CANCER CENTER ONLY)
Abs Immature Granulocytes: 0.01 K/uL (ref 0.00–0.07)
Basophils Absolute: 0.1 K/uL (ref 0.0–0.1)
Basophils Relative: 1 %
Eosinophils Absolute: 0.3 K/uL (ref 0.0–0.5)
Eosinophils Relative: 8 %
HCT: 38.2 % — ABNORMAL LOW (ref 39.0–52.0)
Hemoglobin: 12.7 g/dL — ABNORMAL LOW (ref 13.0–17.0)
Immature Granulocytes: 0 %
Lymphocytes Relative: 18 %
Lymphs Abs: 0.7 K/uL (ref 0.7–4.0)
MCH: 30.9 pg (ref 26.0–34.0)
MCHC: 33.2 g/dL (ref 30.0–36.0)
MCV: 92.9 fL (ref 80.0–100.0)
Monocytes Absolute: 0.5 K/uL (ref 0.1–1.0)
Monocytes Relative: 12 %
Neutro Abs: 2.3 K/uL (ref 1.7–7.7)
Neutrophils Relative %: 61 %
Platelet Count: 177 K/uL (ref 150–400)
RBC: 4.11 MIL/uL — ABNORMAL LOW (ref 4.22–5.81)
RDW: 13.1 % (ref 11.5–15.5)
WBC Count: 3.7 K/uL — ABNORMAL LOW (ref 4.0–10.5)
nRBC: 0 % (ref 0.0–0.2)

## 2023-10-14 LAB — CMP (CANCER CENTER ONLY)
ALT: 16 U/L (ref 0–44)
AST: 23 U/L (ref 15–41)
Albumin: 3.7 g/dL (ref 3.5–5.0)
Alkaline Phosphatase: 43 U/L (ref 38–126)
Anion gap: 7 (ref 5–15)
BUN: 21 mg/dL (ref 8–23)
CO2: 26 mmol/L (ref 22–32)
Calcium: 8.7 mg/dL — ABNORMAL LOW (ref 8.9–10.3)
Chloride: 105 mmol/L (ref 98–111)
Creatinine: 1.1 mg/dL (ref 0.61–1.24)
GFR, Estimated: >60 mL/min (ref >60)
Glucose, Bld: 83 mg/dL (ref 70–99)
Potassium: 4.2 mmol/L (ref 3.5–5.1)
Sodium: 138 mmol/L (ref 135–145)
Total Bilirubin: 0.6 mg/dL (ref 0.0–1.2)
Total Protein: 6.7 g/dL (ref 6.5–8.1)

## 2023-10-14 LAB — PSA: Prostatic Specific Antigen: 0.01 ng/mL (ref 0.00–4.00)

## 2023-10-15 ENCOUNTER — Ambulatory Visit: Admitting: Internal Medicine

## 2023-10-15 ENCOUNTER — Encounter: Payer: Self-pay | Admitting: Internal Medicine

## 2023-10-15 VITALS — BP 110/80 | HR 91 | Temp 97.6°F | Ht 68.0 in | Wt 205.0 lb

## 2023-10-15 DIAGNOSIS — R413 Other amnesia: Secondary | ICD-10-CM

## 2023-10-15 NOTE — Progress Notes (Signed)
 Subjective:    Patient ID: Christian Carlson, male    DOB: February 13, 1941, 83 y.o.   MRN: [id number]  HPI Here with wife due to memory problems and sleepiness  Went to see brother in law in [place]  While there--he fell asleep during time with people Took 3 hour nap which is not like him  Wife wonders about low testosterone --since getting lupron  yearly No apnea per wife but her iPhone noted no deep sleep Did use CPAP in the past though  He is having memory issues as well ---thinks he cut okra or did other tasks, and then wife finds he didn't do Then he forgot details about some planting things (turnip greens)  Current Outpatient Medications on File Prior to Visit  Medication Sig Dispense Refill   aspirin 81 MG chewable tablet Chew 81 mg by mouth daily.     calcium carbonate (OSCAL) 1500 (600 Ca) MG TABS tablet Take 600 mg of elemental calcium by mouth daily with breakfast.     ciclopirox  (LOPROX ) 0.77 % cream APPLY AS DIRECTED APPLY TO FEET AND IN BETWEEN TOES TWICE DAILY 90 g 1   magnesium oxide (MAG-OX) 400 MG tablet Take 400 mg by mouth daily.     Multiple Vitamin (MULTI-VITAMINS) TABS Take 1 tablet by mouth daily.     omeprazole (PRILOSEC) 20 MG capsule Take 20 mg by mouth daily.     tamsulosin  (FLOMAX ) 0.4 MG CAPS capsule Take 1 capsule (0.4 mg total) by mouth daily. 90 capsule 3   valACYclovir  (VALTREX ) 1000 MG tablet Take 2 tablets at first sign of symptoms, repeat in 12 hours for a 1 day dose. Stop Treatment.  Repeat as needed for new cold sores 12 tablet 1   Zinc 50 MG CAPS      No current facility-administered medications on file prior to visit.    No Known Allergies  Past Medical History:  Diagnosis Date   Barrett's esophagus    Basal cell carcinoma 12/01/2022   Right superior posterior helix. Micronodular pattern. 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    Past Surgical History:  Procedure Laterality Date   CATARACT EXTRACTION, BILATERAL  2015   ESOPHAGOGASTRODUODENOSCOPY (EGD) WITH PROPOFOL  N/A 10/09/2017   Surgeon: Therisa Bi, MD; Location: [facility]; REPEAT 3 YEARS 10/2020 Barrett's esophagus   INGUINAL HERNIA REPAIR Right 04/29/2022   Procedure: RIGHT HERNIA REPAIR INGUINAL ADULT WITH MESH;  Surgeon: Vanderbilt Ned, MD;  Location: [facility];  Service: General;  Laterality: Right;  TAP BLOCK   INSERTION OF MESH  04/29/2022   Procedure: INSERTION OF MESH;  Surgeon: Vanderbilt Ned, MD;  Location: [facility];  Service: General;;   TONSILLECTOMY AND ADENOIDECTOMY      Family History  Problem Relation Age of Onset   Dementia Mother    Stroke Father    Diabetes Father    Hypertension Sister    Dementia Sister    Stroke Brother    Esophageal cancer Brother    Stroke Paternal  Great-grandfather    Heart disease Neg Hx     Social History   Socioeconomic History   Marital status: Married    Spouse name: Not on file   Number of children: 2   Years of education: Not on file   Highest education level: Not on file  Occupational History   Occupation: Production assistant, radio    Comment: Retired  Tobacco Use   Smoking status: Former    Passive exposure: Past   Smokeless tobacco: Never  Substance and Sexual Activity   Alcohol use: No    Comment: quit 2 years ago with wife's breast cancer   Drug use: Never   Sexual activity: Not on file  Other Topics Concern   Not on file  Social History Narrative   Has living will   Wife, then daughter, should make health care decisions   Would accept resuscitation but  no prolonged ventilation   No tube feeds if cognitively unaware   Social Drivers of Corporate investment banker Strain: Not on file  Food Insecurity: Not on file  Transportation Needs: Not on file  Physical Activity: Not on file  Stress: Not on file  Social Connections: Not on file  Intimate Partner Violence: Not on file   Review of Systems Does regular exercise---daily gardening Social interaction--golf, bible study, church Hearing aides are wearing out---needs new one (just got)    Objective:   Physical Exam Constitutional:      Appearance: Normal appearance.  Neurological:     Mental Status: He is alert and oriented to person, place, and time.  Psychiatric:        Mood and Affect: Mood normal.        Behavior: Behavior normal.            Assessment & Plan:

## 2023-10-15 NOTE — Assessment & Plan Note (Signed)
 This is clearly worse Early functional issues Will check MRI Refer to neurology

## 2023-11-03 ENCOUNTER — Encounter: Payer: Self-pay | Admitting: *Deleted

## 2023-11-30 ENCOUNTER — Ambulatory Visit
Admission: RE | Admit: 2023-11-30 | Discharge: 2023-11-30 | Disposition: A | Discharge status: Home / Self Care | Source: Ambulatory Visit | Attending: Internal Medicine | Admitting: Internal Medicine

## 2023-11-30 DIAGNOSIS — R413 Other amnesia: Secondary | ICD-10-CM | POA: Insufficient documentation

## 2023-12-02 ENCOUNTER — Other Ambulatory Visit: Payer: Self-pay | Admitting: Medical Genetics

## 2023-12-02 ENCOUNTER — Other Ambulatory Visit

## 2023-12-02 DIAGNOSIS — Z006 Encounter for examination for normal comparison and control in clinical research program: Secondary | ICD-10-CM

## 2023-12-07 ENCOUNTER — Ambulatory Visit (INDEPENDENT_AMBULATORY_CARE_PROVIDER_SITE_OTHER)

## 2023-12-07 VITALS — BP 124/80 | HR 96 | Temp 97.6°F | Ht 68.0 in | Wt 209.0 lb

## 2023-12-07 DIAGNOSIS — Z79899 Other long term (current) drug therapy: Secondary | ICD-10-CM

## 2023-12-07 DIAGNOSIS — M85851 Other specified disorders of bone density and structure, right thigh: Secondary | ICD-10-CM | POA: Diagnosis not present

## 2023-12-07 DIAGNOSIS — M8580 Other specified disorders of bone density and structure, unspecified site: Secondary | ICD-10-CM

## 2023-12-07 NOTE — Progress Notes (Unsigned)
 Subjective:    Patient ID: Christian Carlson, male    DOB: Oct 24, 1940, 83 y.o.   MRN: [id number]   Christian Carlson is a very pleasant 82 y.o. male who presents today for follow-up of chronic conditions. Patient says he does take a calcium and vitamin D  supplement for his bones, cannot recall how much of the supplement he takes. Says he takes daily aspirin which was started by an old doctor he had several years ago.  Denies any history of MI, stents, TIA or stroke. No acute complaints today.   Review of Systems  All other systems reviewed and are negative.        No Known Allergies  Current Outpatient Medications on File Prior to Visit  Medication Sig Dispense Refill   ciclopirox  (LOPROX ) 0.77 % cream APPLY AS DIRECTED APPLY TO FEET AND IN BETWEEN TOES TWICE DAILY 90 g 1   magnesium oxide (MAG-OX) 400 MG tablet Take 400 mg by mouth daily.     Multiple Vitamin (MULTI-VITAMINS) TABS Take 1 tablet by mouth daily.     omeprazole (PRILOSEC) 20 MG capsule Take 20 mg by mouth daily.     tamsulosin  (FLOMAX ) 0.4 MG CAPS capsule Take 1 capsule (0.4 mg total) by mouth daily. 90 capsule 3   valACYclovir  (VALTREX ) 1000 MG tablet Take 2 tablets at first sign of symptoms, repeat in 12 hours for a 1 day dose. Stop Treatment.  Repeat as needed for new cold sores 12 tablet 1   Zinc 50 MG CAPS      No current facility-administered medications on file prior to visit.    BP 124/80 (BP Location: Left Arm, Patient Position: Sitting, Cuff Size: Large)   Pulse 96   Temp 97.6 F (36.4 C) (Oral)   Ht 5' 8 (1.727 m)   Wt 209 lb (94.8 kg)   SpO2 95%   BMI 31.78 kg/m   Objective:    Physical Exam Vitals and nursing note reviewed.  Constitutional:      Appearance: Normal appearance.  HENT:     Head: Normocephalic and atraumatic.  Eyes:     Extraocular Movements: Extraocular movements intact.     Conjunctiva/sclera: Conjunctivae normal.  Skin:    General: Skin is warm.  Neurological:     Mental  Status: He is alert.  Psychiatric:        Mood and Affect: Mood normal.        Behavior: Behavior normal.         Assessment & Plan:   1. Osteopenia of right femoral neck (Primary) Per chart review, most recent bone density scan from April 2024 showed osteopenia of the right femoral neck, FRAX score revealed 10-year risk of hip fracture at 10.7%.  Treated with yearly Reclast , next dose scheduled for November 2025.  Of note, patient is on long-term PPI therapy for his Barrett's esophagus.  Counseled patient about the potential for interaction between PPI and calcium-vitamin D  supplement which can reduce the absorption of the supplement, leading him and adequately treated.  Consulted with clinic pharmacy liaison who recommended that they be taken separately.  Plan is for him to take Prilosec in the morning while taking calcium/vitamin D  supplement at night.  Given that the supplement is OTC, unclear whether patient is getting adequate calcium and vitamin D  dosing, plan for him to send the dosage of the capsules via MyChart and the dose can be augmented accordingly to ensure his osteopenia is being adequately treated.  In the meantime,  we will order vitamin D  level for monitoring. Counseled patient about the importance of weightbearing exercises to help improve bone density and further reduce risk of fractures, he is agreeable to start incline walking, either on treadmill or by walking outdoors on small hills in the surrounding area. Further counseled about increasing intake of dairy rich foods to optimize bone mineralization.   - VITAMIN D  25 Hydroxy (Vit-D Deficiency, Fractures) - Continue yearly Reclast  - Continue DEXA scans every 2 years for monitoring  2.  Medication management Per chart review, patient is taking daily aspirin and has been for several years, however, given no history of MI, stent placement, TIA or stroke, there is no medical indication to continue this.  Extensively discussed  potential risks of long-term aspirin therapy, the most significant which would be increased risk for GI bleed especially in the elderly population.  Patient is agreeable to discontinue.  - Discontinued aspirin 81 mg daily  Return in about 4 weeks (around 01/04/2024) for CPE.   Onelia Cadmus K Roby Donaway, MD  12/07/23

## 2023-12-07 NOTE — Patient Instructions (Addendum)
 Thank you for visiting [facility] today! Here's what we talked about: - Stop taking daily asiprin - Send via MyChart your calcium supplement and dosage - I will confirm about Prilosec dosing via MyChart - Try to walk on an incline for 30 mins at a time 3 times weekly - Take Caltrate at night, Preilosec in the morning

## 2023-12-08 ENCOUNTER — Ambulatory Visit: Payer: Self-pay

## 2023-12-08 LAB — VITAMIN D 25 HYDROXY (VIT D DEFICIENCY, FRACTURES): VITD: 42.21 ng/mL (ref 30.00–100.00)

## 2023-12-10 LAB — GENECONNECT MOLECULAR SCREEN

## 2023-12-24 ENCOUNTER — Other Ambulatory Visit: Payer: Self-pay | Admitting: Medical Genetics

## 2023-12-24 DIAGNOSIS — Z006 Encounter for examination for normal comparison and control in clinical research program: Secondary | ICD-10-CM

## 2023-12-28 ENCOUNTER — Other Ambulatory Visit: Payer: Self-pay | Admitting: Radiation Oncology

## 2024-01-08 ENCOUNTER — Ambulatory Visit (INDEPENDENT_AMBULATORY_CARE_PROVIDER_SITE_OTHER)

## 2024-01-08 VITALS — Ht 68.0 in | Wt 202.0 lb

## 2024-01-08 DIAGNOSIS — Z0000 Encounter for general adult medical examination without abnormal findings: Secondary | ICD-10-CM | POA: Diagnosis not present

## 2024-01-08 NOTE — Patient Instructions (Signed)
 Christian Carlson,  Thank you for taking the time for your Medicare Wellness Visit. I appreciate your continued commitment to your health goals. Please review the care plan we discussed, and feel free to reach out if I can assist you further.  Medicare recommends these wellness visits once per year to help you and your care team stay ahead of potential health issues. These visits are designed to focus on prevention, allowing your provider to concentrate on managing your acute and chronic conditions during your regular appointments.  Please note that Annual Wellness Visits do not include a physical exam. Some assessments may be limited, especially if the visit was conducted virtually. If needed, we may recommend a separate in-person follow-up with your provider.  Ongoing Care Seeing your primary care provider every 3 to 6 months helps us  monitor your health and provide consistent, personalized care.   Referrals If a referral was made during today's visit and you haven't received any updates within two weeks, please contact the referred provider directly to check on the status.  Recommended Screenings:  Health Maintenance  Topic Date Due   Flu Shot  06/14/2024*   DTaP/Tdap/Td vaccine (2 - Tdap) 12/06/2024*   COVID-19 Vaccine (4 - 2025-26 season) 12/06/2024*   Medicare Annual Wellness Visit  01/07/2025   Pneumococcal Vaccine for age over 47  Completed   Zoster (Shingles) Vaccine  Completed   Meningitis B Vaccine  Aged Out  *Topic was postponed. The date shown is not the original due date.       08/20/2023   10:02 AM  Advanced Directives  Does Patient Have a Medical Advance Directive? Yes  Type of Advance Directive Healthcare Power of Attorney  Does patient want to make changes to medical advance directive? No - Patient declined  Copy of Healthcare Power of Attorney in Chart? Yes - validated most recent copy scanned in chart (See row information)   Advance Care Planning is important because  it: Ensures you receive medical care that aligns with your values, goals, and preferences. Provides guidance to your family and loved ones, reducing the emotional burden of decision-making during critical moments.  Vision: Annual vision screenings are recommended for early detection of glaucoma, cataracts, and diabetic retinopathy. These exams can also reveal signs of chronic conditions such as diabetes and high blood pressure.  Dental: Annual dental screenings help detect early signs of oral cancer, gum disease, and other conditions linked to overall health, including heart disease and diabetes.

## 2024-01-08 NOTE — Progress Notes (Signed)
 Subjective:   Christian Carlson is a 83 y.o. who presents for a Medicare Wellness preventive visit.  As a reminder, Annual Wellness Visits don't include a physical exam, and some assessments may be limited, especially if this visit is performed virtually. We may recommend an in-person follow-up visit with your provider if needed.  Visit Complete: Virtual I connected with  Christian Carlson on 01/08/24 by a audio enabled telemedicine application and verified that I am speaking with the correct person using two identifiers.  Patient Location: Home  Provider Location: Office/Clinic  I discussed the limitations of evaluation and management by telemedicine. The patient expressed understanding and agreed to proceed.  Vital Signs: Because this visit was a virtual/telehealth visit, some criteria may be missing or patient reported. Any vitals not documented were not able to be obtained and vitals that have been documented are patient reported.  VideoDeclined- This patient declined Librarian, academic. Therefore the visit was completed with audio only.  Persons Participating in Visit: Patient.  AWV Questionnaire: No: Patient Medicare AWV questionnaire was not completed prior to this visit.  Cardiac Risk Factors include: advanced age (>34men, >67 women);obesity (BMI >30kg/m2);male gender     Objective:    Today's Vitals   01/08/24 1302  Weight: 202 lb (91.6 kg)  Height: 5' 8 (1.727 m)   Body mass index is 30.71 kg/m.     01/08/2024    1:12 PM 08/20/2023   10:02 AM 07/15/2023    1:58 PM 07/15/2022    1:33 PM 05/14/2022    9:56 AM 04/29/2022   10:32 AM 04/22/2022    9:19 AM  Advanced Directives  Does Patient Have a Medical Advance Directive? Yes Yes No Yes Yes Yes Yes  Type of Estate agent of [place];Living will Healthcare Power of [employer] of [place];Living will Living will Living will;Healthcare Power of Attorney Living will  Does  patient want to make changes to medical advance directive?  No - Patient declined   No - Patient declined No - Patient declined No - Patient declined  Copy of Healthcare Power of Attorney in Chart? Yes - validated most recent copy scanned in chart (See row information) Yes - validated most recent copy scanned in chart (See row information)   No - copy requested No - copy requested No - copy requested  Would patient like information on creating a medical advance directive?   No - Patient declined        Current Medications (verified) Outpatient Encounter Medications as of 01/08/2024  Medication Sig   ciclopirox  (LOPROX ) 0.77 % cream APPLY AS DIRECTED APPLY TO FEET AND IN BETWEEN TOES TWICE DAILY   donepezil (ARICEPT) 5 MG tablet Take 5 mg by mouth.   magnesium oxide (MAG-OX) 400 MG tablet Take 400 mg by mouth daily.   Multiple Vitamin (MULTI-VITAMINS) TABS Take 1 tablet by mouth daily.   omeprazole (PRILOSEC) 20 MG capsule Take 20 mg by mouth daily.   tamsulosin  (FLOMAX ) 0.4 MG CAPS capsule TAKE 1 CAPSULE BY MOUTH EVERY DAY   valACYclovir  (VALTREX ) 1000 MG tablet Take 2 tablets at first sign of symptoms, repeat in 12 hours for a 1 day dose. Stop Treatment.  Repeat as needed for new cold sores   Zinc 50 MG CAPS    No facility-administered encounter medications on file as of 01/08/2024.    Allergies (verified) Patient has no known allergies.   History: Past Medical History:  Diagnosis Date   Arrhythmia  Approx 3 months ago   Barrett's esophagus    Basal cell carcinoma 12/01/2022   Right superior posterior helix. Micronodular pattern. 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   Past Surgical History:  Procedure Laterality Date   CATARACT EXTRACTION, BILATERAL  2015   ESOPHAGOGASTRODUODENOSCOPY (EGD) WITH PROPOFOL  N/A 10/09/2017   Surgeon: Therisa Bi, MD; Location: [facility] ENDOSCOPY; REPEAT 3 YEARS 10/2020 Barrett's esophagus   EYE SURGERY  Cataracts in both eyes   Corrected   HERNIA REPAIR     2024   INGUINAL HERNIA REPAIR Right 04/29/2022   Procedure: RIGHT HERNIA REPAIR INGUINAL ADULT WITH MESH;  Surgeon: Vanderbilt Ned, MD;  Location: [facility];  Service: General;  Laterality: Right;  TAP BLOCK   INSERTION OF MESH  04/29/2022   Procedure: INSERTION OF MESH;  Surgeon: Vanderbilt Ned, MD;  Location:  SURGERY CENTER;  Service: General;;   TONSILLECTOMY AND ADENOIDECTOMY     TUBAL LIGATION  Years ago   VASECTOMY  Approx 1975   Family History  Problem Relation Age of Onset   Dementia Mother    Varicose Veins Mother    Stroke Father    Diabetes Father    Arthritis Father    Hypertension Sister    Dementia Sister    Stroke Brother    Esophageal cancer Brother    Cancer Brother    Stroke Paternal Great-grandfather    Heart disease Neg Hx    Social History   Socioeconomic History   Marital status: Married    Spouse name: Not on file   Number of children: 2   Years of education: Not on file   Highest education level: Bachelor's degree (e.g., BA, AB, BS)  Occupational History   Occupation: Production assistant, radio    Comment: Retired  Tobacco Use   Smoking status: Former    Passive exposure: Past   Smokeless tobacco: Never  Substance and Sexual Activity   Alcohol use: No    Comment: quit 2 years ago with wife's breast cancer   Drug use: Never   Sexual activity: Not on file  Other Topics Concern   Not on file  Social History Narrative   Has living will   Wife, then daughter,  should make health care decisions   Would accept resuscitation but no prolonged ventilation   No tube feeds if cognitively unaware   Social Drivers of Health   Financial Resource Strain: Low Risk  (01/08/2024)   Overall Financial Resource Strain (CARDIA)    Difficulty of Paying Living Expenses: Not hard at all  Food Insecurity: No Food Insecurity (01/07/2024)   Received from [facility]   Hunger Vital Sign    Within the past 12 months, you worried that your food would run out before you got the money to buy more.: Never true    Within the past 12 months, the food you bought just didn't last and you  didn't have money to get more.: Never true  Transportation Needs: No Transportation Needs (01/08/2024)   PRAPARE - Administrator, Civil Service (Medical): No    Lack of Transportation (Non-Medical): No  Physical Activity: Insufficiently Active (01/08/2024)   Exercise Vital Sign    Days of Exercise per Week: 4 days    Minutes of Exercise per Session: 30 min  Stress: No Stress Concern Present (01/08/2024)   [employer] of Occupational Health - Occupational Stress Questionnaire    Feeling of Stress: Not at all  Social Connections: Moderately Integrated (01/08/2024)   Social Connection and Isolation Panel    Frequency of Communication with Friends and Family: Twice a week    Frequency of Social Gatherings with Friends and Family: More than three times a week    Attends Religious Services: More than 4 times per year    Active Member of [employer] or Organizations: No    Attends Engineer, structural: Never    Marital Status: Married    Tobacco Counseling Counseling given: Not Answered    Clinical Intake:  Pre-visit preparation completed: Yes  Pain : No/denies pain     BMI - recorded: 30.71 Nutritional Status: BMI > 30  Obese Nutritional Risks: None Diabetes: No  No results found for: HGBA1C   How often do you need to have someone  help you when you read instructions, pamphlets, or other written materials from your doctor or pharmacy?: 1 - Never  Interpreter Needed?: No  Comments: lives with wife Information entered by :: B.Devyne Hauger,LPN   Activities of Daily Living     01/08/2024    1:12 PM  In your present state of health, do you have any difficulty performing the following activities:  Hearing? 1  Vision? 0  Difficulty concentrating or making decisions? 1  Walking or climbing stairs? 0  Dressing or bathing? 0  Doing errands, shopping? 0  Preparing Food and eating ? N  Using the Toilet? N  In the past six months, have you accidently leaked urine? N  Do you have problems with loss of bowel control? N  Managing your Medications? N  Managing your Finances? N  Housekeeping or managing your Housekeeping? N    Patient Care Team: Bowa, Nahosi K, MD as PCP - General (Family Medicine) Melanee Annah BROCKS, MD as Consulting Physician (Oncology) Myrna Adine Anes, MD as Consulting Physician (Ophthalmology)  I have updated your Care Teams any recent Medical Services you may have received from other providers in the past year.     Assessment:   This is a routine wellness examination for Xavier.  Hearing/Vision screen Hearing Screening - Comments:: Patient denies any hearing difficulties w/heaaring aids. Vision Screening - Comments:: Pt says their vision is good with glasses for reading Dr  KATHEE Myrna   Goals Addressed             This Visit's Progress    Patient Stated       I would like to stay as healthy as possible       Depression Screen     01/08/2024    1:09 PM 12/07/2023    2:08 PM 10/15/2023    2:17 PM 12/25/2022    2:43 PM 12/25/2022    2:11 PM 12/12/2021    4:34 PM 12/07/2020   12:47 PM  PHQ 2/9 Scores  PHQ - 2 Score 0 0 0 0 0 0 0    Fall Risk     01/08/2024  1:05 PM 12/07/2023    2:08 PM 10/15/2023    2:17 PM 12/25/2022    2:42 PM 12/25/2022    2:11 PM  Fall Risk   Falls in the  past year? 0 0 0 1 0  Number falls in past yr: 0 0 0 0 0  Injury with Fall? 0 0 0 0 0  Risk for fall due to : No Fall Risks No Fall Risks No Fall Risks  No Fall Risks  Follow up Falls prevention discussed;Education provided Falls evaluation completed Falls evaluation completed  Falls evaluation completed    MEDICARE RISK AT HOME:  Medicare Risk at Home Any stairs in or around the home?: Yes If so, are there any without handrails?: Yes Home free of loose throw rugs in walkways, pet beds, electrical cords, etc?: Yes Adequate lighting in your home to reduce risk of falls?: Yes Life alert?: No Use of a cane, walker or w/c?: No Grab bars in the bathroom?: Yes Shower chair or bench in shower?: No Elevated toilet seat or a handicapped toilet?: Yes  TIMED UP AND GO:  Was the test performed?  No  Cognitive Function: 6CIT completed        01/08/2024    1:14 PM  6CIT Screen  What Year? 0 points  What month? 0 points  What time? 0 points  Count back from 20 0 points  Months in reverse 2 points  Repeat phrase 0 points  Total Score 2 points    Immunizations Immunization History  Administered Date(s) Administered   Fluad Quad(high Dose 65+) 01/07/2024   Fluad Trivalent(High Dose 65+) 12/25/2022   INFLUENZA, HIGH DOSE SEASONAL PF 01/07/2016   Influenza-Unspecified 12/15/2016, 12/29/2017, 12/31/2021   Moderna Covid-19 Fall Seasonal Vaccine 20yrs & older 01/07/2024   Moderna Sars-Covid-2 Vaccination 03/26/2019, 04/26/2019, 12/06/2020   Pneumococcal Conjugate-13 04/07/2013   Pneumococcal Polysaccharide-23 01/05/2005, 11/11/2016   Td 11/13/2010   Zoster Recombinant(Shingrix) 01/27/2018, 04/12/2018   Zoster, Live 03/22/2006    Screening Tests Health Maintenance  Topic Date Due   DTaP/Tdap/Td (2 - Tdap) 12/06/2024 (Originally 11/12/2020)   COVID-19 Vaccine (5 - Moderna risk 2025-26 season) 07/07/2024   Medicare Annual Wellness (AWV)  01/07/2025   Pneumococcal Vaccine: 50+ Years   Completed   Influenza Vaccine  Completed   Zoster Vaccines- Shingrix  Completed   Meningococcal B Vaccine  Aged Out    Health Maintenance Items Addressed: None due at this time  Additional Screening:  Vision Screening: Recommended annual ophthalmology exams for early detection of glaucoma and other disorders of the eye. Is the patient up to date with their annual eye exam?  Yes  Who is the provider or what is the name of the office in which the patient attends annual eye exams? Dr Myrna  Dental Screening: Recommended annual dental exams for proper oral hygiene  Community Resource Referral / Chronic Care Management: CRR required this visit?  No   CCM required this visit?  No   Plan:    I have personally reviewed and noted the following in the patient's chart:   Medical and social history Use of alcohol, tobacco or illicit drugs  Current medications and supplements including opioid prescriptions. Patient is not currently taking opioid prescriptions. Functional ability and status Nutritional status Physical activity Advanced directives List of other physicians Hospitalizations, surgeries, and ER visits in previous 12 months Vitals Screenings to include cognitive, depression, and falls Referrals and appointments  In addition, I have reviewed and discussed with patient certain  preventive protocols, quality metrics, and best practice recommendations. A written personalized care plan for preventive services as well as general preventive health recommendations were provided to patient.   Erminio LITTIE Saris, LPN   [id number]   After Visit Summary: (MyChart) Due to this being a telephonic visit, the after visit summary with patients personalized plan was offered to patient via MyChart   Notes: Nothing significant to report at this time.

## 2024-01-11 ENCOUNTER — Ambulatory Visit

## 2024-01-11 VITALS — BP 110/70 | HR 62 | Temp 97.5°F | Ht 68.75 in | Wt 209.0 lb

## 2024-01-11 DIAGNOSIS — Z136 Encounter for screening for cardiovascular disorders: Secondary | ICD-10-CM

## 2024-01-11 DIAGNOSIS — K219 Gastro-esophageal reflux disease without esophagitis: Secondary | ICD-10-CM

## 2024-01-11 DIAGNOSIS — K2270 Barrett's esophagus without dysplasia: Secondary | ICD-10-CM

## 2024-01-11 DIAGNOSIS — Z0000 Encounter for general adult medical examination without abnormal findings: Secondary | ICD-10-CM

## 2024-01-11 DIAGNOSIS — Z23 Encounter for immunization: Secondary | ICD-10-CM | POA: Diagnosis not present

## 2024-01-11 DIAGNOSIS — M85851 Other specified disorders of bone density and structure, right thigh: Secondary | ICD-10-CM | POA: Diagnosis not present

## 2024-01-11 NOTE — Progress Notes (Signed)
 Assessment & Plan:   This visit was conducted in person. The patient gave informed consent to the use of [employer] AI technology to record the contents of the encounter as documented below.  Assessment & Plan  1. Physical exam, annual (Primary) 2. Screening for cardiovascular condition 3. Need for vaccination against Streptococcus pneumoniae Reviewed vaccination status and general health maintenance. Discussed the importance of vaccinations and exercise. Discussed advance directives and power of attorney. He prefers resuscitation efforts if heart stops. Daughter is healthcare power of attorney.   - Send healthcare power of attorney documentation to clinic. - Ensure up-to-date on flu and shingles vaccines. - Discussed RSV vaccine status, confirmed received last year at pharmacy. - Encourage regular eye exams every two years. - Discussed importance of balanced diet and regular exercise. - Needs tetanus vaccine from pharmacy - Schedule fasting cholesterol lab later this week. - Pneumococcal conjugate vaccine 20-valent (Prevnar 20)  4. Osteopenia of right hip Per chart review, DEXA scan from 2024 showed osteopenia in the right femoral neck, 10-year risk of hip fracture was greater than 3%. Per chart review, has been following with oncology, with plans to receive Reclast  in November 2025.  5. Gastroesophageal reflux disease, unspecified whether esophagitis present 6. Barrett's esophagus without dysplasia Chronic dry cough likely due to inadequately treated GERD. Symptoms include intermittent dry cough, worse at night, and occasional bitter taste. No heartburn reported. 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 Due for repeat endoscopy every three years. Family history of esophageal cancer noted.  - Increase omeprazole to one tablet twice daily before meals. - Monitor cough symptoms and return if no improvement in 1-2 weeks. - Call [facility] in [place] to  schedule repeat endoscopy.      Follow-up: Return in about 1 year (around 01/10/2025) for CPE, schedule for fasting lab visit later this week .        Subjective:   Patient ID: Christian Carlson, male    DOB: 1940-04-17  Age: 83 y.o. MRN: [id number]  Patient Care Team: Bennett Reuben POUR, MD as PCP - General (Family Medicine) Melanee Annah BROCKS, MD as Consulting Physician (Oncology) Myrna Adine Anes, MD as Consulting Physician (Ophthalmology)   CC:  Chief Complaint  Patient presents with   Annual Exam    Did Medicare Wellness with Erminio 01-08-24. Here with wife.  Recently saw Dr Maree, Neurology. 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  Coughs a lot. Stated about a year ago. Worse when he lays down. Past due for repeat endoscopy with Dr Therisa.      Christian Carlson is a 83 y.o. male who presents today for a complete physical exam.   Discussed the use of AI scribe software for clinical note transcription with the patient, who gave verbal consent to proceed.  History of Present Illness Christian Carlson is an 83 year old male with Barrett's esophagus who presents for a routine physical exam and evaluation of a chronic cough.  He has a chronic dry cough that is intermittent, often occurring after eating or while talking. The cough worsens at night when lying down, and he sometimes coughs so hard that he struggles to catch his breath. He occasionally experiences a bitter taste in the mouth, particularly in the morning. No heartburn, runny or stuffy nose, or sensation of post-nasal drip.  He has a history of Barrett's esophagus and has not had an endoscopy since 2019, despite recommendations for monitoring every three years. His family history includes esophageal cancer in his  brother and uncle. He is currently taking omeprazole 20 mg daily. He also takes Flomax  and magnesium, and previously took zinc during the winter but has since discontinued it.  He reports no recent falls, feels safe at home,  and denies any mood disturbances such as depression or anxiety. He engages in gardening for physical activity but acknowledges it may not be sufficient exercise. He has a history of smoking as a teenager but quit over 60 years ago. He is up to date on his flu and shingles vaccinations and received the RSV vaccine last year.   Advanced Directives Full code, Healthcare POA is daughter.    Depression Screening;    01/11/2024   11:05 AM 01/08/2024    1:09 PM 12/07/2023    2:08 PM 10/15/2023    2:17 PM 12/25/2022    2:43 PM 12/25/2022    2:11 PM 12/12/2021    4:34 PM  PHQ 2/9 Scores  PHQ - 2 Score 0 0 0 0 0 0 0     Anxiety Screening:     No data to display           ROS: Negative unless specifically indicated above in HPI.       Objective:     BP 110/70 (BP Location: Left Arm, Patient Position: Sitting, Cuff Size: Large)   Pulse 62   Temp (!) 97.5 F (36.4 C) (Oral)   Ht 5' 8.75 (1.746 m)   Wt 209 lb (94.8 kg)   SpO2 96%   BMI 31.09 kg/m    Physical Exam GENERAL: Alert, cooperative, well developed, no acute distress. HEAD: Normocephalic atraumatic. EYES: Extraocular movements intact bilaterally, pupils round, equal and reactive to light bilaterally, conjunctivae normal bilaterally. EARS: Cerumen buildup present. NOSE: No congestion or rhinorrhea, mucous membranes are moist. THROAT: Oral cavity normal, no oropharyngeal exudate or posterior oropharyngeal erythema. CARDIOVASCULAR: Normal heart rate and rhythm, S1 and S2 normal without murmurs. CHEST: Clear to auscultation bilaterally, no wheezes, rhonchi, or crackles. ABDOMEN: Soft, non tender, non distended, without organomegaly, normal bowel sounds. EXTREMITIES: No cyanosis or edema. NEUROLOGICAL: Oriented to person, place and time, no gait abnormalities, moves all extremities without gross motor or sensory deficit.        [name] K Tarez Bowns, MD  01/11/24    Contains text generated by [employer].

## 2024-01-11 NOTE — Patient Instructions (Addendum)
 Thank you for visiting [facility] today! Here's what we talked about: - Go and get Tetanus shot from your pharmacy - Call [facility] Phone:  [phone]  - Take Omeprazole 1 tablet twice daily

## 2024-01-12 ENCOUNTER — Other Ambulatory Visit (INDEPENDENT_AMBULATORY_CARE_PROVIDER_SITE_OTHER)

## 2024-01-12 DIAGNOSIS — Z136 Encounter for screening for cardiovascular disorders: Secondary | ICD-10-CM | POA: Diagnosis not present

## 2024-01-12 LAB — LIPID PANEL
Cholesterol: 190 mg/dL (ref 0–200)
HDL: 60.1 mg/dL (ref >39.00)
LDL Cholesterol: 113 mg/dL — ABNORMAL HIGH (ref 0–99)
NonHDL: 129.97
Total CHOL/HDL Ratio: 3
Triglycerides: 85 mg/dL (ref 0.0–149.0)
VLDL: 17 mg/dL (ref 0.0–40.0)

## 2024-01-13 ENCOUNTER — Telehealth: Payer: Self-pay | Admitting: Oncology

## 2024-01-13 ENCOUNTER — Inpatient Hospital Stay: Attending: Oncology

## 2024-01-13 ENCOUNTER — Encounter: Payer: Self-pay | Admitting: Oncology

## 2024-01-13 ENCOUNTER — Inpatient Hospital Stay

## 2024-01-13 ENCOUNTER — Inpatient Hospital Stay: Admitting: Oncology

## 2024-01-13 VITALS — BP 109/75 | HR 89 | Temp 96.9°F | Resp 19 | Ht 68.75 in | Wt 210.8 lb

## 2024-01-13 DIAGNOSIS — C799 Secondary malignant neoplasm of unspecified site: Secondary | ICD-10-CM | POA: Diagnosis present

## 2024-01-13 DIAGNOSIS — Z7983 Long term (current) use of bisphosphonates: Secondary | ICD-10-CM | POA: Diagnosis not present

## 2024-01-13 DIAGNOSIS — M8580 Other specified disorders of bone density and structure, unspecified site: Secondary | ICD-10-CM | POA: Insufficient documentation

## 2024-01-13 DIAGNOSIS — Z5181 Encounter for therapeutic drug level monitoring: Secondary | ICD-10-CM | POA: Diagnosis not present

## 2024-01-13 DIAGNOSIS — Z79899 Other long term (current) drug therapy: Secondary | ICD-10-CM | POA: Diagnosis not present

## 2024-01-13 DIAGNOSIS — C61 Malignant neoplasm of prostate: Secondary | ICD-10-CM | POA: Diagnosis present

## 2024-01-13 DIAGNOSIS — Z79818 Long term (current) use of other agents affecting estrogen receptors and estrogen levels: Secondary | ICD-10-CM

## 2024-01-13 LAB — CBC WITH DIFFERENTIAL (CANCER CENTER ONLY)
Abs Immature Granulocytes: 0.01 K/uL (ref 0.00–0.07)
Basophils Absolute: 0 K/uL (ref 0.0–0.1)
Basophils Relative: 1 %
Eosinophils Absolute: 0.4 K/uL (ref 0.0–0.5)
Eosinophils Relative: 9 %
HCT: 38.3 % — ABNORMAL LOW (ref 39.0–52.0)
Hemoglobin: 12.6 g/dL — ABNORMAL LOW (ref 13.0–17.0)
Immature Granulocytes: 0 %
Lymphocytes Relative: 17 %
Lymphs Abs: 0.8 K/uL (ref 0.7–4.0)
MCH: 30.7 pg (ref 26.0–34.0)
MCHC: 32.9 g/dL (ref 30.0–36.0)
MCV: 93.2 fL (ref 80.0–100.0)
Monocytes Absolute: 0.6 K/uL (ref 0.1–1.0)
Monocytes Relative: 12 %
Neutro Abs: 2.8 K/uL (ref 1.7–7.7)
Neutrophils Relative %: 61 %
Platelet Count: 188 K/uL (ref 150–400)
RBC: 4.11 MIL/uL — ABNORMAL LOW (ref 4.22–5.81)
RDW: 12.8 % (ref 11.5–15.5)
WBC Count: 4.6 K/uL (ref 4.0–10.5)
nRBC: 0 % (ref 0.0–0.2)

## 2024-01-13 LAB — CMP (CANCER CENTER ONLY)
ALT: 18 U/L (ref 0–44)
AST: 29 U/L (ref 15–41)
Albumin: 3.7 g/dL (ref 3.5–5.0)
Alkaline Phosphatase: 44 U/L (ref 38–126)
Anion gap: 7 (ref 5–15)
BUN: 14 mg/dL (ref 8–23)
CO2: 27 mmol/L (ref 22–32)
Calcium: 8.7 mg/dL — ABNORMAL LOW (ref 8.9–10.3)
Chloride: 105 mmol/L (ref 98–111)
Creatinine: 1.11 mg/dL (ref 0.61–1.24)
GFR, Estimated: >60 mL/min (ref >60)
Glucose, Bld: 74 mg/dL (ref 70–99)
Potassium: 4.4 mmol/L (ref 3.5–5.1)
Sodium: 139 mmol/L (ref 135–145)
Total Bilirubin: 0.6 mg/dL (ref 0.0–1.2)
Total Protein: 6.9 g/dL (ref 6.5–8.1)

## 2024-01-13 LAB — PSA: Prostatic Specific Antigen: <0.02 ng/mL (ref 0.00–4.00)

## 2024-01-13 MED ORDER — LEUPROLIDE ACETATE (6 MONTH) 45 MG ~~LOC~~ KIT
45.0000 mg | PACK | Freq: Once | SUBCUTANEOUS | Status: AC
  Administered 2024-01-13: 45 mg via SUBCUTANEOUS
  Filled 2024-01-13: qty 45

## 2024-01-13 NOTE — Progress Notes (Signed)
 Patient doing okay and does not have any new or acute concerns at this time.

## 2024-01-13 NOTE — Progress Notes (Signed)
 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  Only 1 core was submitted for pathology diagnosis and therefore additional information is not known based on that biopsy specimen.  He received IMRT radiation therapy for the same.  He has not had a prostatectomy.  Patient received 1 dose of Lupron  back in April 2020.  He has now been referred to us  for rising PSA.  Most recent PSA from 12/12/2021 was 1.92.      PSMA PET scan showed no focal activity to localized prostatic carcinoma.  2 very small PSA avid right internal iliac lymph nodes consistent with nodal metastases with an SUV uptake of 16.5.  No evidence of nodal metastases outside of pelvis.  No visceral metastases or skeletal metastases.   Plan completed radiation treatment to the prostate and external iliac lymph nodes.  ADT started in October 2023 with plan to give it for 2 years.  Bone Density scan shows osteopenia involving the right femur neck with a T-score of -1.4.  Although 10-year probability of major osteoporotic fracture was less than 20% at 15.2%, 10-year probability of hip fracture was more than 3% at 10.7% and hence prophylactic bisphosphonates was recommended.  He received first dose of Reclast  in November 2024  Interval history-presently is tolerating Lupron   well without any significant side effects  ECOG PS- 1 Pain scale- 0   Review of systems- Review of Systems  Constitutional:  Negative for chills, fever, malaise/fatigue and weight loss.  HENT:  Negative for congestion, ear discharge and nosebleeds.   Eyes:  Negative for blurred vision.  Respiratory:  Negative for cough, hemoptysis, sputum production, shortness of breath and wheezing.   Cardiovascular:  Negative for chest pain, palpitations, orthopnea and claudication.  Gastrointestinal:  Negative for abdominal pain, blood in stool, constipation, diarrhea, heartburn, melena, nausea and vomiting.  Genitourinary:  Negative for dysuria, flank pain, frequency, hematuria and urgency.  Musculoskeletal:  Negative for back pain, joint pain and myalgias.  Skin:  Negative for rash.  Neurological:  Negative for dizziness, tingling, focal weakness, seizures, weakness and headaches.  Endo/Heme/Allergies:  Does not bruise/bleed easily.  Psychiatric/Behavioral:  Negative for depression and suicidal ideas. The patient does not have insomnia.       No Known Allergies   Past Medical History:  Diagnosis Date   Arrhythmia Approx 3 months ago   Barrett's esophagus    Basal cell carcinoma 12/01/2022   Right superior posterior helix. Micronodular pattern. 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     Past Surgical History:  Procedure Laterality Date   CATARACT EXTRACTION, BILATERAL  2015   ESOPHAGOGASTRODUODENOSCOPY (EGD) WITH PROPOFOL  N/A 10/09/2017    Surgeon: Therisa Bi, MD; Location: [facility]; REPEAT 3 YEARS 10/2020 Barrett's esophagus   EYE SURGERY  Cataracts in both eyes   Corrected   HERNIA REPAIR     2024   INGUINAL HERNIA REPAIR Right 04/29/2022   Procedure: RIGHT HERNIA REPAIR INGUINAL ADULT WITH MESH;  Surgeon: Vanderbilt Ned, MD;  Location: [facility];  Service: General;  Laterality: Right;  TAP BLOCK   INSERTION OF MESH  04/29/2022   Procedure: INSERTION OF MESH;  Surgeon: Vanderbilt Ned, MD;  Location: [facility];  Service: General;;   TONSILLECTOMY AND ADENOIDECTOMY     TUBAL LIGATION  Years ago   VASECTOMY  Approx 1975    Social History   Socioeconomic History   Marital status: Married    Spouse name: Not on file   Number of children: 2   Years of education: Not on file   Highest education level: Bachelor's degree (e.g., BA, AB, BS)  Occupational History   Occupation: Production assistant, radio    Comment: Retired  Tobacco Use   Smoking status: Former    Passive exposure: Past   Smokeless tobacco: Never  Substance and Sexual Activity   Alcohol use: No    Comment: quit 2 years ago with wife's breast cancer   Drug use: Never   Sexual activity: Not on file  Other Topics Concern   Not on file  Social History Narrative   Has living will   Wife, then daughter, should make health care decisions   Would accept resuscitation but no prolonged ventilation   No tube feeds if cognitively unaware   Social Drivers of Health   Financial Resource Strain: Low Risk  (01/08/2024)   Overall Financial Resource Strain (CARDIA)    Difficulty of Paying Living Expenses: Not hard at all  Food Insecurity: No Food Insecurity (01/07/2024)   Received from [facility]   Hunger Vital Sign    Within the past 12 months, you worried that your food would run out before you got the money to buy more.: Never true    Within the past 12 months, the food you bought just didn't last and  you didn't have money to get more.: Never true  Transportation Needs: No Transportation Needs (01/08/2024)   PRAPARE - Administrator, Civil Service (Medical): No    Lack of Transportation (Non-Medical): No  Physical Activity: Insufficiently Active (01/08/2024)   Exercise Vital Sign    Days of Exercise per Week: 4 days    Minutes of Exercise per Session: 30 min  Stress: No Stress Concern Present (01/08/2024)   [employer] of Occupational Health - Occupational Stress Questionnaire    Feeling of Stress: Not at all  Social Connections: Moderately Integrated (01/08/2024)   Social Connection and Isolation Panel    Frequency of Communication with Friends and Family: Twice a week    Frequency of Social Gatherings with Friends and Family: More than three times a week    Attends Religious Services: More than 4 times per year    Active Member of [employer] or Organizations: No    Attends Banker Meetings: Never  Marital Status: Married  Catering Manager Violence: Not At Risk (01/08/2024)   Humiliation, Afraid, Rape, and Kick questionnaire    Fear of Current or Ex-Partner: No    Emotionally Abused: No    Physically Abused: No    Sexually Abused: No    Family History  Problem Relation Age of Onset   Dementia Mother    Varicose Veins Mother    Stroke Father    Diabetes Father    Arthritis Father    Hypertension Sister    Dementia Sister    Stroke Brother    Esophageal cancer Brother    Cancer Brother    Stroke Paternal Great-grandfather    Heart disease Neg Hx      Current Outpatient Medications:    Cholecalciferol (VITAMIN D -1000 MAX ST) 25 MCG (1000 UT) tablet, Take by mouth., Disp: , Rfl:    ciclopirox  (LOPROX ) 0.77 % cream, APPLY AS DIRECTED APPLY TO FEET AND IN BETWEEN TOES TWICE DAILY, Disp: 90 g, Rfl: 1   donepezil (ARICEPT) 5 MG tablet, Take 5 mg by mouth., Disp: , Rfl:    magnesium oxide (MAG-OX) 400 MG tablet, Take 400 mg by mouth daily., Disp:  , Rfl:    MAGNESIUM PO, Take by mouth., Disp: , Rfl:    Multiple Vitamin (MULTI-VITAMINS) TABS, Take 1 tablet by mouth daily., Disp: , Rfl:    omeprazole (PRILOSEC) 20 MG capsule, Take 20 mg by mouth 2 (two) times daily before a meal., Disp: , Rfl:    tamsulosin  (FLOMAX ) 0.4 MG CAPS capsule, TAKE 1 CAPSULE BY MOUTH EVERY DAY, Disp: 90 capsule, Rfl: 3   valACYclovir  (VALTREX ) 1000 MG tablet, Take 2 tablets at first sign of symptoms, repeat in 12 hours for a 1 day dose. Stop Treatment.  Repeat as needed for new cold sores, Disp: 12 tablet, Rfl: 1  Physical exam:  Vitals:   01/13/24 1019  BP: 109/75  Pulse: 89  Resp: 19  Temp: (!) 96.9 F (36.1 C)  TempSrc: Tympanic  SpO2: 98%  Weight: 210 lb 12.8 oz (95.6 kg)  Height: 5' 8.75 (1.746 m)   Physical Exam Cardiovascular:     Rate and Rhythm: Normal rate and regular rhythm.     Heart sounds: Normal heart sounds.  Pulmonary:     Effort: Pulmonary effort is normal.     Breath sounds: Normal breath sounds.  Abdominal:     General: Bowel sounds are normal.     Palpations: Abdomen is soft.  Skin:    General: Skin is warm and dry.  Neurological:     Mental Status: He is alert and oriented to person, place, and time.      I have personally reviewed labs listed below:    Latest Ref Rng & Units 01/13/2024   10:04 AM  CMP  Glucose 70 - 99 mg/dL 74   BUN 8 - 23 mg/dL 14   Creatinine 9.38 - 1.24 mg/dL 8.88   Sodium 864 - 854 mmol/L 139   Potassium 3.5 - 5.1 mmol/L 4.4   Chloride 98 - 111 mmol/L 105   CO2 22 - 32 mmol/L 27   Calcium 8.9 - 10.3 mg/dL 8.7   Total Protein 6.5 - 8.1 g/dL 6.9   Total Bilirubin 0.0 - 1.2 mg/dL 0.6   Alkaline Phos 38 - 126 U/L 44   AST 15 - 41 U/L 29   ALT 0 - 44 U/L 18       Latest Ref Rng & Units  01/13/2024   10:04 AM  CBC  WBC 4.0 - 10.5 K/uL 4.6   Hemoglobin 13.0 - 17.0 g/dL 87.3   Hematocrit 60.9 - 52.0 % 38.3   Platelets 150 - 400 K/uL 188      Assessment and plan- Patient is a 83 y.o.  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 PSA stable at 0.01. Lupron  treatment break due to potential adverse effects. Risk of castrate resistance acknowledged. - Administer Lupron  injection today. - Monitor PSA levels every three months. - Schedule PSMA PET scan in six months. - Discuss potential restart of Lupron  if PSA levels rise.  Osteopenia Osteopenia managed with annual Reclast . Bone density monitored biennially. - Administer Reclast  injection in November 2025. - Schedule bone density scan for April 2026.         Visit Diagnosis 1. Prostate cancer [facility])      Dr. Annah Skene, MD, MPH [facility] [phone] 01/13/2024 1:06 PM

## 2024-01-13 NOTE — Telephone Encounter (Signed)
 I called the pt, no answer so I left him a vm regarding the pt scheduled pet scan date and time.

## 2024-01-18 ENCOUNTER — Ambulatory Visit: Payer: Self-pay

## 2024-01-18 MED ORDER — ATORVASTATIN CALCIUM 10 MG PO TABS: 10.0000 mg | ORAL_TABLET | Freq: Every day | ORAL | 1 refills | Status: AC

## 2024-01-29 ENCOUNTER — Inpatient Hospital Stay: Attending: Oncology

## 2024-01-29 ENCOUNTER — Inpatient Hospital Stay

## 2024-01-29 VITALS — BP 123/78 | HR 70 | Temp 97.8°F | Resp 18

## 2024-01-29 DIAGNOSIS — Z809 Family history of malignant neoplasm, unspecified: Secondary | ICD-10-CM | POA: Insufficient documentation

## 2024-01-29 DIAGNOSIS — Z818 Family history of other mental and behavioral disorders: Secondary | ICD-10-CM | POA: Insufficient documentation

## 2024-01-29 DIAGNOSIS — C799 Secondary malignant neoplasm of unspecified site: Secondary | ICD-10-CM | POA: Insufficient documentation

## 2024-01-29 DIAGNOSIS — Z823 Family history of stroke: Secondary | ICD-10-CM | POA: Insufficient documentation

## 2024-01-29 DIAGNOSIS — Z8261 Family history of arthritis: Secondary | ICD-10-CM | POA: Insufficient documentation

## 2024-01-29 DIAGNOSIS — C61 Malignant neoplasm of prostate: Secondary | ICD-10-CM

## 2024-01-29 DIAGNOSIS — Z800 Family history of malignant neoplasm of digestive organs: Secondary | ICD-10-CM | POA: Insufficient documentation

## 2024-01-29 DIAGNOSIS — Z8249 Family history of ischemic heart disease and other diseases of the circulatory system: Secondary | ICD-10-CM | POA: Insufficient documentation

## 2024-01-29 DIAGNOSIS — Z833 Family history of diabetes mellitus: Secondary | ICD-10-CM | POA: Insufficient documentation

## 2024-01-29 DIAGNOSIS — Z79899 Other long term (current) drug therapy: Secondary | ICD-10-CM | POA: Insufficient documentation

## 2024-01-29 LAB — CBC WITH DIFFERENTIAL (CANCER CENTER ONLY)
Abs Immature Granulocytes: 0.01 K/uL (ref 0.00–0.07)
Basophils Absolute: 0.1 K/uL (ref 0.0–0.1)
Basophils Relative: 1 %
Eosinophils Absolute: 0.3 K/uL (ref 0.0–0.5)
Eosinophils Relative: 7 %
HCT: 39 % (ref 39.0–52.0)
Hemoglobin: 12.7 g/dL — ABNORMAL LOW (ref 13.0–17.0)
Immature Granulocytes: 0 %
Lymphocytes Relative: 18 %
Lymphs Abs: 0.8 K/uL (ref 0.7–4.0)
MCH: 30.5 pg (ref 26.0–34.0)
MCHC: 32.6 g/dL (ref 30.0–36.0)
MCV: 93.8 fL (ref 80.0–100.0)
Monocytes Absolute: 0.5 K/uL (ref 0.1–1.0)
Monocytes Relative: 10 %
Neutro Abs: 2.9 K/uL (ref 1.7–7.7)
Neutrophils Relative %: 64 %
Platelet Count: 197 K/uL (ref 150–400)
RBC: 4.16 MIL/uL — ABNORMAL LOW (ref 4.22–5.81)
RDW: 12.9 % (ref 11.5–15.5)
WBC Count: 4.6 K/uL (ref 4.0–10.5)
nRBC: 0 % (ref 0.0–0.2)

## 2024-01-29 LAB — CMP (CANCER CENTER ONLY)
ALT: 34 U/L (ref 0–44)
AST: 29 U/L (ref 15–41)
Albumin: 3.9 g/dL (ref 3.5–5.0)
Alkaline Phosphatase: 46 U/L (ref 38–126)
Anion gap: 9 (ref 5–15)
BUN: 15 mg/dL (ref 8–23)
CO2: 27 mmol/L (ref 22–32)
Calcium: 8.8 mg/dL — ABNORMAL LOW (ref 8.9–10.3)
Chloride: 104 mmol/L (ref 98–111)
Creatinine: 1.13 mg/dL (ref 0.61–1.24)
GFR, Estimated: >60 mL/min (ref >60)
Glucose, Bld: 107 mg/dL — ABNORMAL HIGH (ref 70–99)
Potassium: 4.5 mmol/L (ref 3.5–5.1)
Sodium: 140 mmol/L (ref 135–145)
Total Bilirubin: 0.5 mg/dL (ref 0.0–1.2)
Total Protein: 7.1 g/dL (ref 6.5–8.1)

## 2024-01-29 LAB — PSA: Prostatic Specific Antigen: <0.02 ng/mL (ref 0.00–4.00)

## 2024-01-29 MED ORDER — SODIUM CHLORIDE 0.9 % IV SOLN
Freq: Once | INTRAVENOUS | Status: AC
  Filled 2024-01-29: qty 250

## 2024-01-29 MED ORDER — ZOLEDRONIC ACID 5 MG/100ML IV SOLN
5.0000 mg | INTRAVENOUS | Status: DC
  Administered 2024-01-29: 5 mg via INTRAVENOUS
  Filled 2024-01-29: qty 100

## 2024-02-10 ENCOUNTER — Ambulatory Visit
Admission: RE | Admit: 2024-02-10 | Discharge: 2024-02-10 | Disposition: A | Discharge status: Home / Self Care | Source: Ambulatory Visit | Attending: Oncology | Admitting: Oncology

## 2024-02-10 DIAGNOSIS — C61 Malignant neoplasm of prostate: Secondary | ICD-10-CM | POA: Insufficient documentation

## 2024-02-10 DIAGNOSIS — M8589 Other specified disorders of bone density and structure, multiple sites: Secondary | ICD-10-CM | POA: Diagnosis not present

## 2024-02-17 ENCOUNTER — Other Ambulatory Visit: Payer: Self-pay | Admitting: *Deleted

## 2024-02-17 DIAGNOSIS — C61 Malignant neoplasm of prostate: Secondary | ICD-10-CM

## 2024-02-23 ENCOUNTER — Telehealth: Payer: Self-pay

## 2024-02-23 NOTE — Telephone Encounter (Signed)
 PT return phone call. Pt has confirmed they will be going to [facility] to see Dr. Therisa. Per pt we can cancel this referral.

## 2024-02-24 ENCOUNTER — Inpatient Hospital Stay: Attending: Oncology

## 2024-02-24 DIAGNOSIS — C61 Malignant neoplasm of prostate: Secondary | ICD-10-CM

## 2024-02-24 LAB — PSA: Prostatic Specific Antigen: <0.02 ng/mL (ref 0.00–4.00)

## 2024-03-02 ENCOUNTER — Ambulatory Visit
Admission: RE | Admit: 2024-03-02 | Discharge: 2024-03-02 | Disposition: A | Discharge status: Home / Self Care | Source: Ambulatory Visit | Attending: Radiation Oncology | Admitting: Radiation Oncology

## 2024-03-02 ENCOUNTER — Other Ambulatory Visit: Payer: Self-pay | Admitting: *Deleted

## 2024-03-02 ENCOUNTER — Encounter: Payer: Self-pay | Admitting: Radiation Oncology

## 2024-03-02 VITALS — BP 111/68 | HR 82 | Temp 98.3°F | Resp 16 | Wt 210.0 lb

## 2024-03-02 DIAGNOSIS — Z923 Personal history of irradiation: Secondary | ICD-10-CM | POA: Diagnosis not present

## 2024-03-02 DIAGNOSIS — C61 Malignant neoplasm of prostate: Secondary | ICD-10-CM

## 2024-03-02 DIAGNOSIS — C775 Secondary and unspecified malignant neoplasm of intrapelvic lymph nodes: Secondary | ICD-10-CM | POA: Insufficient documentation

## 2024-03-02 NOTE — Progress Notes (Signed)
 Radiation Oncology Follow up Note  Name: Christian Carlson   Date:   03/02/2024 MRN:  [id number] DOB: 1940-09-10    This 83 y.o. male presents to the clinic today for close to 2-year follow-up status post image guided IMRT radiation therapy to his prostate for stage IIa Gleason 7 (3+4) adenocarcinoma of the prostate presented with a PSA of 11.5.  REFERRING PROVIDER: Jimmy Charlie FERNS, MD  HPI: Patient is an 83 year old male now out almost 2 years having completed image guided IMRT radiation therapy for a Gleason 7 adenocarcinoma the prostate.  Seen today in routine follow-up he is doing well.  He specifically denies any increased lower urinary tract symptoms.  Patient did have rising PSA up to 1.9 back in 23.  PSA PET scan showed no focal activity did have 2 very small PSA avid right internal iliac nodes consistent with nodal metastasis.  He has been on ADT therapy since October 2023 with Lupron .  His most recent PSA is less than 0.02 his last Lupron  injection was in October  COMPLICATIONS OF TREATMENT: none  FOLLOW UP COMPLIANCE: keeps appointments   PHYSICAL EXAM:  BP 111/68   Pulse 82   Temp 98.3 F (36.8 C) (Tympanic)   Resp 16   Wt 210 lb (95.3 kg)   BMI 31.24 kg/m  Well-developed well-nourished patient in NAD. HEENT reveals PERLA, EOMI, discs not visualized.  Oral cavity is clear. No oral mucosal lesions are identified. Neck is clear without evidence of cervical or supraclavicular adenopathy. Lungs are clear to A&P. Cardiac examination is essentially unremarkable with regular rate and rhythm without murmur rub or thrill. Abdomen is benign with no organomegaly or masses noted. Motor sensory and DTR levels are equal and symmetric in the upper and lower extremities. Cranial nerves II through XII are grossly intact. Proprioception is intact. No peripheral adenopathy or edema is identified. No motor or sensory levels are noted. Crude visual fields are within normal range.  RADIOLOGY RESULTS:  PSMA PET scan scheduled for March 2026  PLAN: Present time patient is doing well he continues on Lupron  therapy through medical oncology.  His PSA is undetectable.  Clinically he is doing well.  Have asked to see him back in 1 year for follow-up.  Patient knows to call with any concerns.  I would be happy to reevaluate the patient should his PSMA PET scan show any progressive disease.  I would like to take this opportunity to thank you for allowing me to participate in the care of your patient.SABRA Marcey Penton, MD

## 2024-03-21 ENCOUNTER — Telehealth: Payer: Self-pay | Admitting: Oncology

## 2024-03-21 NOTE — Telephone Encounter (Signed)
 Pt spouse wants to know if pt needs to keep lab only on 1/29 because he just had a lab done for Dr.Chrystal. Please advise and call pt spouse with an update thank you.

## 2024-03-23 ENCOUNTER — Telehealth: Payer: Self-pay

## 2024-03-23 NOTE — Telephone Encounter (Signed)
 Voicemail received from caregiver today 03/23/24 at 11:20am stating she has questions regarding a lab scheduled for him. Best call back number is [phone].  Per OV note dated 01/13/24 and as stated below Lupron  today.  Reclast  in November 2025 with CMP.  CBC with differential CMP and PSA in 3 months in 6 months.  See me in 6 months.  Needs a bone density scan prior.  Needs PSMA PET scan prior to seeing me.  Outbound call; spoke to spouse Christian Carlson and informed Dr. Lenn only checked PSA recently so patient would need to come for lab appointment. Spouse verbalized understanding.

## 2024-03-23 NOTE — Telephone Encounter (Signed)
See phone note dated earlier today.

## 2024-03-23 NOTE — Telephone Encounter (Signed)
 Per OV note dated 01/13/24 CBC with differential CMP and PSA in 3 months in 6 months.  See me in 6 months.  Dr. Lenn only checked PSA recently so patient would need to come for lab appointment.  Outbound call to spouse, voice message left to contact center. When spouse returns call please inform of above.

## 2024-04-14 ENCOUNTER — Inpatient Hospital Stay: Attending: Oncology

## 2024-04-14 DIAGNOSIS — C61 Malignant neoplasm of prostate: Secondary | ICD-10-CM

## 2024-04-14 LAB — CMP (CANCER CENTER ONLY)
ALT: 14 U/L (ref 0–44)
AST: 26 U/L (ref 15–41)
Albumin: 3.9 g/dL (ref 3.5–5.0)
Alkaline Phosphatase: 46 U/L (ref 38–126)
Anion gap: 11 (ref 5–15)
BUN: 18 mg/dL (ref 8–23)
CO2: 27 mmol/L (ref 22–32)
Calcium: 8.8 mg/dL — ABNORMAL LOW (ref 8.9–10.3)
Chloride: 102 mmol/L (ref 98–111)
Creatinine: 1.19 mg/dL (ref 0.61–1.24)
GFR, Estimated: >60 mL/min
Glucose, Bld: 136 mg/dL — ABNORMAL HIGH (ref 70–99)
Potassium: 4.3 mmol/L (ref 3.5–5.1)
Sodium: 140 mmol/L (ref 135–145)
Total Bilirubin: 0.4 mg/dL (ref 0.0–1.2)
Total Protein: 6.9 g/dL (ref 6.5–8.1)

## 2024-04-14 LAB — CBC WITH DIFFERENTIAL (CANCER CENTER ONLY)
Abs Immature Granulocytes: 0.01 10*3/uL (ref 0.00–0.07)
Basophils Absolute: 0 10*3/uL (ref 0.0–0.1)
Basophils Relative: 0 %
Eosinophils Absolute: 0.2 10*3/uL (ref 0.0–0.5)
Eosinophils Relative: 6 %
HCT: 39.7 % (ref 39.0–52.0)
Hemoglobin: 12.8 g/dL — ABNORMAL LOW (ref 13.0–17.0)
Immature Granulocytes: 0 %
Lymphocytes Relative: 26 %
Lymphs Abs: 0.8 10*3/uL (ref 0.7–4.0)
MCH: 30.1 pg (ref 26.0–34.0)
MCHC: 32.2 g/dL (ref 30.0–36.0)
MCV: 93.4 fL (ref 80.0–100.0)
Monocytes Absolute: 0.3 10*3/uL (ref 0.1–1.0)
Monocytes Relative: 9 %
Neutro Abs: 1.7 10*3/uL (ref 1.7–7.7)
Neutrophils Relative %: 59 %
Platelet Count: 155 10*3/uL (ref 150–400)
RBC: 4.25 MIL/uL (ref 4.22–5.81)
RDW: 12.9 % (ref 11.5–15.5)
WBC Count: 2.9 10*3/uL — ABNORMAL LOW (ref 4.0–10.5)
nRBC: 0 % (ref 0.0–0.2)

## 2024-04-14 LAB — PSA: Prostatic Specific Antigen: <0.02 ng/mL (ref 0.00–4.00)

## 2024-06-02 ENCOUNTER — Encounter: Admitting: Dermatology

## 2024-06-08 ENCOUNTER — Other Ambulatory Visit

## 2024-07-15 ENCOUNTER — Inpatient Hospital Stay: Admitting: Oncology

## 2024-07-15 ENCOUNTER — Inpatient Hospital Stay

## 2025-01-04 ENCOUNTER — Other Ambulatory Visit

## 2025-01-11 ENCOUNTER — Encounter

## 2025-01-12 ENCOUNTER — Ambulatory Visit

## 2025-01-13 ENCOUNTER — Ambulatory Visit

## 2025-02-23 ENCOUNTER — Inpatient Hospital Stay

## 2025-03-02 ENCOUNTER — Ambulatory Visit: Admitting: Radiation Oncology
